# Patient Record
Sex: Female | Born: 1965
Health system: Southern US, Community
[De-identification: ages and names within clinical notes are randomized; demographics above are authoritative.]

## PROBLEM LIST (undated history)

## (undated) DIAGNOSIS — I493 Ventricular premature depolarization: Secondary | ICD-10-CM

## (undated) DIAGNOSIS — E079 Disorder of thyroid, unspecified: Secondary | ICD-10-CM

## (undated) DIAGNOSIS — R011 Cardiac murmur, unspecified: Secondary | ICD-10-CM

## (undated) DIAGNOSIS — E785 Hyperlipidemia, unspecified: Secondary | ICD-10-CM

## (undated) DIAGNOSIS — F32A Depression, unspecified: Secondary | ICD-10-CM

## (undated) DIAGNOSIS — N189 Chronic kidney disease, unspecified: Secondary | ICD-10-CM

## (undated) DIAGNOSIS — K760 Fatty (change of) liver, not elsewhere classified: Secondary | ICD-10-CM

## (undated) DIAGNOSIS — IMO0001 Reserved for inherently not codable concepts without codable children: Secondary | ICD-10-CM

## (undated) DIAGNOSIS — K219 Gastro-esophageal reflux disease without esophagitis: Secondary | ICD-10-CM

## (undated) DIAGNOSIS — C44611 Basal cell carcinoma of skin of unspecified upper limb, including shoulder: Secondary | ICD-10-CM

## (undated) DIAGNOSIS — N1832 Chronic kidney disease, stage 3b: Secondary | ICD-10-CM

## (undated) DIAGNOSIS — J45909 Unspecified asthma, uncomplicated: Secondary | ICD-10-CM

## (undated) DIAGNOSIS — E039 Hypothyroidism, unspecified: Secondary | ICD-10-CM

## (undated) DIAGNOSIS — J449 Chronic obstructive pulmonary disease, unspecified: Secondary | ICD-10-CM

## (undated) DIAGNOSIS — R1013 Epigastric pain: Secondary | ICD-10-CM

## (undated) DIAGNOSIS — R112 Nausea with vomiting, unspecified: Secondary | ICD-10-CM

## (undated) DIAGNOSIS — I499 Cardiac arrhythmia, unspecified: Secondary | ICD-10-CM

## (undated) DIAGNOSIS — F649 Gender identity disorder, unspecified: Secondary | ICD-10-CM

## (undated) DIAGNOSIS — C801 Malignant (primary) neoplasm, unspecified: Secondary | ICD-10-CM

## (undated) DIAGNOSIS — F419 Anxiety disorder, unspecified: Secondary | ICD-10-CM

## (undated) DIAGNOSIS — M1990 Unspecified osteoarthritis, unspecified site: Secondary | ICD-10-CM

## (undated) DIAGNOSIS — K759 Inflammatory liver disease, unspecified: Secondary | ICD-10-CM

## (undated) DIAGNOSIS — G5792 Unspecified mononeuropathy of left lower limb: Secondary | ICD-10-CM

## (undated) DIAGNOSIS — E559 Vitamin D deficiency, unspecified: Secondary | ICD-10-CM

## (undated) DIAGNOSIS — Z9889 Other specified postprocedural states: Secondary | ICD-10-CM

## (undated) DIAGNOSIS — N809 Endometriosis, unspecified: Secondary | ICD-10-CM

## (undated) DIAGNOSIS — I10 Essential (primary) hypertension: Secondary | ICD-10-CM

## (undated) HISTORY — PX: BREAST SURGERY: SHX581

## (undated) HISTORY — DX: Malignant (primary) neoplasm, unspecified: C80.1

## (undated) HISTORY — PX: SINUSOTOMY: SHX291

## (undated) HISTORY — PX: UPPER GI ENDOSCOPY: SHX6162

## (undated) HISTORY — PX: LAPAROSCOPY: SHX197

## (undated) HISTORY — DX: Disorder of thyroid, unspecified: E07.9

## (undated) HISTORY — DX: Gastro-esophageal reflux disease without esophagitis: K21.9

## (undated) HISTORY — DX: Endometriosis, unspecified: N80.9

## (undated) HISTORY — DX: Reserved for inherently not codable concepts without codable children: IMO0001

## (undated) HISTORY — PX: TONSILLECTOMY AND ADENOIDECTOMY: SHX28

## (undated) HISTORY — PX: ABDOMINAL HYSTERECTOMY: SHX81

## (undated) HISTORY — PX: KNEE ARTHROSCOPY: SHX127

## (undated) HISTORY — DX: Ventricular premature depolarization: I49.3

## (undated) HISTORY — DX: Vitamin D deficiency, unspecified: E55.9

---

## 1998-01-18 HISTORY — PX: LAPAROSCOPY: SHX197

## 1998-01-18 HISTORY — PX: COLONOSCOPY: SHX174

## 2001-01-18 HISTORY — PX: UPPER GI ENDOSCOPY: SHX6162

## 2002-01-18 HISTORY — PX: KNEE ARTHROSCOPY: SHX127

## 2004-01-19 HISTORY — PX: TOTAL ABDOMINAL HYSTERECTOMY W/ BILATERAL SALPINGOOPHORECTOMY: SHX83

## 2004-12-22 ENCOUNTER — Inpatient Hospital Stay: Payer: Self-pay | Admitting: Unknown Physician Specialty

## 2006-01-18 DIAGNOSIS — I493 Ventricular premature depolarization: Secondary | ICD-10-CM

## 2006-01-18 HISTORY — DX: Ventricular premature depolarization: I49.3

## 2006-09-24 ENCOUNTER — Emergency Department: Payer: Self-pay | Admitting: Emergency Medicine

## 2006-09-24 ENCOUNTER — Other Ambulatory Visit: Payer: Self-pay

## 2006-10-19 ENCOUNTER — Ambulatory Visit: Payer: Self-pay | Admitting: Internal Medicine

## 2006-10-25 ENCOUNTER — Ambulatory Visit: Payer: Self-pay

## 2006-10-25 ENCOUNTER — Encounter: Payer: Self-pay | Admitting: Internal Medicine

## 2007-01-19 HISTORY — PX: TONSILLECTOMY: SHX5217

## 2007-01-19 HISTORY — PX: NASAL SEPTUM SURGERY: SHX37

## 2010-01-05 ENCOUNTER — Ambulatory Visit: Payer: Self-pay | Admitting: Surgery

## 2010-01-18 HISTORY — PX: BREAST BIOPSY: SHX20

## 2010-06-02 NOTE — Letter (Signed)
October 19, 2006    Wonda Cheng, MD  [address]  [place], [place] [address]   RE:  Destiny Wright, Destiny Wright  MRN:  [id number]  /  DOB:  [birth year]   Dear Francis Dowse,   I hope this letter finds you and your wife well as the school year  starts up again.   It was a pleasure to see Story Conti at your request.  As you know, she  is a 45 year old woman who has a past medical history of endometriosis,  and has a somewhat remote history of having occasional palpitations,  which have become increasingly problematic over the last couple of  months and prompted a visit to the emergency room.  At that time a  diagnosis of chest pain was noted.  Blood work demonstrated a mildly  elevated TSH with a normal T4.  Because of her palpitations, though, she  followed up with you and you undertook a Holter monitor which captured  some of her typical symptoms.  She describes this as a fluttering with  which her heart is beating somewhat fast as well as a hard heart beat  which she sometimes feels in her throat.  These are occasionally  accompanied by shortness of breath.  There is mild light headedness.  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   She has no exertional limitations for the most part and no changes in  her functional capacity.   Her cardiac risk factors are notable for dyslipidemia but are otherwise  negative for family history, cigarettes, hypertension or diabetes.   I should also note that her potassium was low in the emergency room but  currently on followup with you it was normal.   PAST MEDICAL HISTORY:  Her past medical history is as noted previously.   PAST SURGICAL HISTORY:  Her past surgical history is notable for a  laparoscopic surgery, endoscopy, knee arthroscopy and hysterectomy.   SOCIAL HISTORY:  She  lives alone.  She has no children.  She does not  use recreational drugs.  She drinks alcohol occasionally.  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   MEDICATIONS:  Her medications include:  1. Estradiol 1 mg.  2. Prilosec 20.   ALLERGIES:  She has no known drug allergies.   REVIEW OF SYSTEMS:  Her review of systems, in addition to the above, is  notable for:  1. Gastroesophageal reflux.  2. Fatigue.   PHYSICAL EXAMINATION:  GENERAL APPEARANCE: On exam she is a middle-aged  Caucasian female appearing her stated age of 38.  VITAL SIGNS:  Her weight was 215 pounds.  Her blood pressure was 110/72.  Her pulse was 67 and her height was 5 feet 3 inches.  HEENT:  Exam demonstrated no icterus and no xanthomata.  NECK:  The neck veins were flat.  The carotids were brisk and full  bilaterally without bruits.  No thyromegaly was appreciated.  BACK:  The back was without kyphosis, scoliosis.  LUNGS:  Clear.  HEART:  Sounds were regular without murmurs or gallops.  ABDOMEN:  Soft, with active bowel sounds without midline pulsations or  hepatomegaly.  EXTREMITIES:  Femoral pulses were 2+; distal pulses were intact.  There  is no cyanosis, clubbing or edema.  NEUROLOGICAL:  Exam was  grossly normal.  SKIN:  Warm and dry.   An electrocardiogram dated today demonstrated sinus rhythm at 67 with  intervals at 0.16/0.08/0.39.  The axis was 90 degrees.  The  electrocardiogram was otherwise normal.   Review of the Holter monitor that you kindly forwarded demonstrated  monomorphic PVC's comprising 0.9% of her total beats and interestingly,  the bulk of these occur in the period of time after she gets home in the  evenings so that 95% of them occur between the hours of 8:00 and  midnight.   IMPRESSION:  1. Ventricular ectopy - monomorphic.  2. Obesity.  3. Borderline elevated TSH.  4. Borderline low potassium.   Francis Dowse, Ms. Jaskulski has ventricular ectopy which is unusual in its'   temporal distribution.  It is not infrequent that ectopy is more  prominent, more noticeable in the evening and at bedtime but this has  typically been associated with position but her PVC counts are clearly  much higher in the evening.  I wonder whether there is not some  stimulant that was part of her diet, but I couldn't find one on detailed  questioning.   In any case, the implications of PVC's related to the presence or  absence of structural heart disease and accompanying severity of  symptoms.  As it relates to the former, I suggested that we get an  echocardiogram and if this is normal then prognostically I think we can  push on.  In the event that her symptoms are limiting, either beta  blocker's or calcium blocker's would be appropriate to try in this  situation.   I have also explained to her at some length the issue of thyroid  feedback loops so that she would understand the role of the TSH.  She is  to follow up with you about this as well as her hypokalemia.   If there is anything further we can do to help, please don't hesitate to  contact me.    Sincerely,      Duke Salvia, MD, [facility]  Electronically Signed    SCK/MedQ  DD: 10/19/2006  DT: 10/20/2006  Job #: [id number]

## 2010-06-15 ENCOUNTER — Ambulatory Visit: Payer: Self-pay | Admitting: Surgery

## 2010-06-24 ENCOUNTER — Ambulatory Visit: Payer: Self-pay | Admitting: Surgery

## 2010-07-13 ENCOUNTER — Ambulatory Visit: Payer: Self-pay | Admitting: Surgery

## 2010-07-14 LAB — PATHOLOGY REPORT

## 2010-12-28 ENCOUNTER — Ambulatory Visit: Payer: Self-pay

## 2011-01-01 ENCOUNTER — Ambulatory Visit: Payer: Self-pay

## 2011-09-30 ENCOUNTER — Ambulatory Visit: Payer: Self-pay | Admitting: Surgery

## 2012-01-19 HISTORY — PX: ANTERIOR CERVICAL DECOMP/DISCECTOMY FUSION: SHX1161

## 2012-02-24 ENCOUNTER — Ambulatory Visit: Payer: Self-pay | Admitting: Orthopedic Surgery

## 2012-10-20 ENCOUNTER — Ambulatory Visit: Payer: Self-pay | Admitting: Family Medicine

## 2012-12-29 ENCOUNTER — Ambulatory Visit: Payer: Self-pay | Admitting: Unknown Physician Specialty

## 2013-01-18 HISTORY — PX: HAMMER TOE SURGERY: SHX385

## 2013-08-31 ENCOUNTER — Encounter: Payer: Self-pay | Admitting: Podiatry

## 2013-08-31 ENCOUNTER — Ambulatory Visit (INDEPENDENT_AMBULATORY_CARE_PROVIDER_SITE_OTHER): Payer: No Typology Code available for payment source

## 2013-08-31 ENCOUNTER — Ambulatory Visit (INDEPENDENT_AMBULATORY_CARE_PROVIDER_SITE_OTHER): Payer: No Typology Code available for payment source | Admitting: Podiatry

## 2013-08-31 ENCOUNTER — Other Ambulatory Visit: Payer: Self-pay | Admitting: *Deleted

## 2013-08-31 VITALS — BP 149/86 | HR 82 | Resp 16 | Ht 63.0 in | Wt 214.0 lb

## 2013-08-31 DIAGNOSIS — N809 Endometriosis, unspecified: Secondary | ICD-10-CM | POA: Insufficient documentation

## 2013-08-31 DIAGNOSIS — M722 Plantar fascial fibromatosis: Secondary | ICD-10-CM

## 2013-08-31 DIAGNOSIS — M779 Enthesopathy, unspecified: Secondary | ICD-10-CM

## 2013-08-31 MED ORDER — TRIAMCINOLONE ACETONIDE 10 MG/ML IJ SUSP
10.0000 mg | Freq: Once | INTRAMUSCULAR | Status: AC
  Administered 2013-08-31: 10 mg

## 2013-08-31 NOTE — Progress Notes (Signed)
Subjective:     Patient ID: Destiny Wright, female   DOB: 1965-09-10, 48 y.o.   MRN: [id number]  Foot Pain   patient states that I'm having a lot of pain in my forefoot left and also into my arch left. Also states that her right heel is starting to hurt. She is seen several other physicians and has had orthotics made several injections without relief of symptoms and at this time is desperate for relief of pain   Review of Systems  All other systems reviewed and are negative.      Objective:   Physical Exam  Nursing note and vitals reviewed. Constitutional: She is oriented to person, place, and time.  Cardiovascular: Intact distal pulses.   Musculoskeletal: Normal range of motion.  Neurological: She is oriented to person, place, and time.  Skin: Skin is warm.   neurovascular status intact with muscle strength adequate and range of motion of the subtalar and midtarsal joint within normal limits. Patient's found to have equinus bilateral and is noted to have moderate discomfort in the left arch and plantar heel when palpated. Patient is also noted to have extreme inflammation and pain third metatarsal phalangeal joint left with no indications of discomfort in the interspace itself. Patient has well-perfused digits and mild reduction of arch upon weightbearing     Assessment:     Capsulitis of the third metatarsophalangeal joint left with plantar fasciitis right and moderate arch pain left which may be compensation in nature    Plan:     H&P and x-rays reviewed. For the left I did do a proximal nerve block aspirated the joint was able to get out a small amount of clear fluid and then injected with a half cc of dexamethasone Kenalog. For the right I injected the plantar heel 3 mg Kenalog 5 mg Xylocaine and I did dispense a night splint to use on the left foot and also some on the right foot. Placed on [place] and reappoint in 1 week

## 2013-08-31 NOTE — Progress Notes (Signed)
   Subjective:    Patient ID: Destiny Wright, female    DOB: [birth year], 48 y.o.   MRN: [id number]  HPI Comments: The left foot started to hurt last year, went to two other doctors for this foot. The first doctor got me a pair of orthotics , than the second doctor got me custom inserts, new shoes , nsaids, injections . The pain is so bad it is keeping me up at night, pain in my two toes that feel like they are going to crack in two, the arch is a stabbing pain. This is my third doctor i am seeing for my foot.  The orthotics that dr Elvina Mattes has made for me he has been tweaking it since i got them Would like to have the right foot looked at as well, have a little heel pain .  Foot Pain      Review of Systems  Musculoskeletal: Positive for back pain.       Joint pain        Objective:   Physical Exam        Assessment & Plan:

## 2013-09-07 ENCOUNTER — Encounter: Payer: Self-pay | Admitting: Podiatry

## 2013-09-07 ENCOUNTER — Ambulatory Visit (INDEPENDENT_AMBULATORY_CARE_PROVIDER_SITE_OTHER): Payer: No Typology Code available for payment source | Admitting: Podiatry

## 2013-09-07 ENCOUNTER — Ambulatory Visit: Payer: Self-pay | Admitting: Podiatry

## 2013-09-07 VITALS — BP 122/71 | HR 67 | Resp 16

## 2013-09-07 DIAGNOSIS — M722 Plantar fascial fibromatosis: Secondary | ICD-10-CM

## 2013-09-07 DIAGNOSIS — M779 Enthesopathy, unspecified: Secondary | ICD-10-CM

## 2013-09-07 MED ORDER — TRIAMCINOLONE ACETONIDE 10 MG/ML IJ SUSP
10.0000 mg | Freq: Once | INTRAMUSCULAR | Status: AC
Start: 2013-09-07 — End: 2013-09-07
  Administered 2013-09-07: 10 mg

## 2013-09-07 NOTE — Progress Notes (Signed)
Subjective:     Patient ID: Destiny Wright, female   DOB: 05/14/1965, 48 y.o.   MRN: [id number]  HPI patient states that the right foot is somewhat improved on the left foot continues to give discomfort and also it's hurting in the ankle   Review of Systems     Objective:   Physical Exam Neurovascular status was intact no health history changes noted with discomfort in the second metatarsophalangeal joint now versus the third and discomfort in the sinus tarsi left and mild discomfort medial side of the ankle. Right heel is still mildly tender but improved but she's not been wearing the boot on the right instep on the left    Assessment:     Forefoot capsulitis left which appears to be more in the second metatarsophalangeal joint and also probable sinus tarsitis left secondary to compensation and continue plan her fasciitis right    Plan:     Advised on changes and physical therapy regimen and usage of boot. Her forefoot block left aspirated the second MPJ and injected with half cc of dexamethasone Kenalog. The left sinus tarsi injected with 3 mg Kenalog 5 mg Xylocaine and I scanned for customized orthotics that are soft to reduce stress against her feet

## 2013-09-20 ENCOUNTER — Encounter: Payer: Self-pay | Admitting: *Deleted

## 2013-09-20 NOTE — Progress Notes (Signed)
Left message letting pt know orthotics are here and sent post card.

## 2013-10-02 ENCOUNTER — Ambulatory Visit (INDEPENDENT_AMBULATORY_CARE_PROVIDER_SITE_OTHER): Payer: No Typology Code available for payment source | Admitting: Podiatry

## 2013-10-02 VITALS — BP 149/86 | HR 82 | Resp 16

## 2013-10-02 DIAGNOSIS — M722 Plantar fascial fibromatosis: Secondary | ICD-10-CM

## 2013-10-02 DIAGNOSIS — M779 Enthesopathy, unspecified: Secondary | ICD-10-CM

## 2013-10-02 MED ORDER — DICLOFENAC SODIUM 75 MG PO TBEC: 75.0000 mg | DELAYED_RELEASE_TABLET | Freq: Two times a day (BID) | ORAL | Status: DC

## 2013-10-02 NOTE — Progress Notes (Signed)
Subjective:     Patient ID: Destiny Wright, female   DOB: [birth year], 48 y.o.   MRN: [id number]  HPI patient states that my feet are still bothering me some but I have been on them quite a bit   Review of Systems     Objective:   Physical Exam Neurovascular status intact muscle strength adequate with discomfort still in the forefoot left over right with mild swelling of the second and third toes occur    Assessment:     Inflammatory capsulitis that may have been activated by [place] the beach and [place]    Plan:     Instructed on physical therapy supportive shoe gear and dispensed orthotics with instructions on usage. May have to consider further injections or other treatment and continue diclofenac at this time

## 2013-10-16 ENCOUNTER — Telehealth: Payer: Self-pay | Admitting: *Deleted

## 2013-10-16 NOTE — Telephone Encounter (Signed)
Patient stated that she could not take the diclofenac and stopped it

## 2013-11-02 ENCOUNTER — Ambulatory Visit (INDEPENDENT_AMBULATORY_CARE_PROVIDER_SITE_OTHER): Payer: No Typology Code available for payment source | Admitting: Podiatry

## 2013-11-02 ENCOUNTER — Encounter: Payer: Self-pay | Admitting: Podiatry

## 2013-11-02 ENCOUNTER — Ambulatory Visit: Payer: Self-pay

## 2013-11-02 DIAGNOSIS — M2042 Other hammer toe(s) (acquired), left foot: Secondary | ICD-10-CM

## 2013-11-02 DIAGNOSIS — M779 Enthesopathy, unspecified: Secondary | ICD-10-CM

## 2013-11-02 DIAGNOSIS — M216X9 Other acquired deformities of unspecified foot: Secondary | ICD-10-CM

## 2013-11-02 DIAGNOSIS — Q667 Congenital pes cavus: Secondary | ICD-10-CM

## 2013-11-02 NOTE — Progress Notes (Signed)
Subjective:     Patient ID: Destiny Wright, female   DOB: 1966-01-02, 48 y.o.   MRN: [id number]  HPI patient presents stating she's getting a lot of pain in her left foot and that simply has not responded to conservative care. I'm the third physician to see this and this is been present for over a year and has been chronic in its nature with the second and third metatarsophalangeal joints been very painful when palpated   Review of Systems     Objective:   Physical Exam  Neurovascular status intact with muscle strength adequate and range of motion subtalar and midtarsal joint within normal limits. Patient has exquisite discomfort second and third metatarsophalangeal joint with lifting of the toes and pain when palpated into the joint surfaces themselves    Assessment:     Chronic capsulitis second and third metatarsophalangeal joint with failure to respond to numerous conservative care attempts    Plan:     Reviewed condition and spent a great deal of time discussing this. At this time I do think we will be best corrected and I discussed shortening osteotomy second and third metatarsals left with screw fixation and digital fusion either internal or external second and third toes left. Patient wants procedure and I discussed and allow her to read a consent form going over all possible complications as outlined on the form. Patient understands that total recovery brittle be 6 months to one year and we cannot give any long-term guarantees as far as relief of symptoms and signs consent form after review. Patient also is dispensed air fracture walker for the postoperative and we'll start wearing it now and will have this done at her convenience

## 2013-11-12 ENCOUNTER — Encounter: Payer: Self-pay | Admitting: Podiatry

## 2013-11-12 NOTE — Progress Notes (Unsigned)
Prior authorization for surgery ok Auth # [id number] Cpt codes 95320/ 23343

## 2013-11-13 ENCOUNTER — Encounter: Payer: Self-pay | Admitting: Podiatry

## 2013-11-13 DIAGNOSIS — M21542 Acquired clubfoot, left foot: Secondary | ICD-10-CM

## 2013-11-13 DIAGNOSIS — M2022 Hallux rigidus, left foot: Secondary | ICD-10-CM

## 2013-11-16 NOTE — Progress Notes (Signed)
1. Shortening osteotomy with screw fixation 2,3 met left foot  2. Fusion with int or ext k wire 2,3 left foot

## 2013-11-23 ENCOUNTER — Encounter: Payer: Self-pay | Admitting: Podiatry

## 2013-11-23 ENCOUNTER — Ambulatory Visit (INDEPENDENT_AMBULATORY_CARE_PROVIDER_SITE_OTHER): Payer: No Typology Code available for payment source

## 2013-11-23 ENCOUNTER — Ambulatory Visit (INDEPENDENT_AMBULATORY_CARE_PROVIDER_SITE_OTHER): Payer: No Typology Code available for payment source | Admitting: Podiatry

## 2013-11-23 VITALS — BP 158/94 | HR 108 | Temp 96.7°F | Resp 16

## 2013-11-23 DIAGNOSIS — M2042 Other hammer toe(s) (acquired), left foot: Secondary | ICD-10-CM

## 2013-11-23 DIAGNOSIS — M779 Enthesopathy, unspecified: Secondary | ICD-10-CM

## 2013-11-23 NOTE — Progress Notes (Signed)
Subjective:     Patient ID: Destiny Wright, female   DOB: 11-Mar-1965, 48 y.o.   MRN: [id number]  HPIpatient states that her toes feel swollen but overall she seems to be doing okay and she is able to walk on her foot with minimal discomfort. 10 days after extensive foot surgery left   Review of Systems     Objective:   Physical Exam Neurovascular status intact muscle strength adequate and I found negative Homans sign noted. Patient's left second and third metatarsal are doing well with the wound edges well coapted and the digits and good alignment patient is able to ambulate without difficulty currently    Assessment:     Doing well post forefoot reconstruction left    Plan:     Reviewed x-rays and discussed condition at this time reapplied sterile dressing and gave instructions on elevation ibuprofen and compression. Reappoint in 2 weeks earlier if any issues should occur

## 2013-12-07 ENCOUNTER — Ambulatory Visit (INDEPENDENT_AMBULATORY_CARE_PROVIDER_SITE_OTHER): Payer: No Typology Code available for payment source

## 2013-12-07 ENCOUNTER — Ambulatory Visit (INDEPENDENT_AMBULATORY_CARE_PROVIDER_SITE_OTHER): Payer: No Typology Code available for payment source | Admitting: Podiatry

## 2013-12-07 VITALS — BP 131/88 | HR 74 | Resp 16

## 2013-12-07 DIAGNOSIS — Q667 Congenital pes cavus: Secondary | ICD-10-CM

## 2013-12-07 DIAGNOSIS — M2042 Other hammer toe(s) (acquired), left foot: Secondary | ICD-10-CM

## 2013-12-07 DIAGNOSIS — M216X9 Other acquired deformities of unspecified foot: Secondary | ICD-10-CM

## 2013-12-07 DIAGNOSIS — R609 Edema, unspecified: Secondary | ICD-10-CM

## 2013-12-08 NOTE — Progress Notes (Signed)
Subjective:     Patient ID: Destiny Wright, female   DOB: 1965-11-17, 48 y.o.   MRN: [id number]  HPI patient states it's doing well but I am getting swelling if him on my foot to much   Review of Systems     Objective:   Physical Exam Neurovascular status intact negative Homans sign noted with well-healing surgical site second third metatarsal second and third toes with moderate forefoot edema noted and obesity is a complicating factor with the edema    Assessment:     Patient's healing well but does have moderate excessive edema at this time    Plan:     H&P and x-rays reviewed and today I applied Unna boot with Ace wrap in order to compress the foot and advised on elevation and then gradual return to saw shoe gear within the next couple weeks. Reappoint in 3 weeks to recheck or earlier if any issues should occur

## 2013-12-28 ENCOUNTER — Ambulatory Visit: Payer: Self-pay

## 2013-12-28 ENCOUNTER — Encounter: Payer: Self-pay | Admitting: Podiatry

## 2013-12-28 ENCOUNTER — Ambulatory Visit (INDEPENDENT_AMBULATORY_CARE_PROVIDER_SITE_OTHER): Payer: No Typology Code available for payment source | Admitting: Podiatry

## 2013-12-28 ENCOUNTER — Ambulatory Visit (INDEPENDENT_AMBULATORY_CARE_PROVIDER_SITE_OTHER): Payer: No Typology Code available for payment source

## 2013-12-28 DIAGNOSIS — Q667 Congenital pes cavus: Secondary | ICD-10-CM

## 2013-12-28 DIAGNOSIS — M2042 Other hammer toe(s) (acquired), left foot: Secondary | ICD-10-CM

## 2013-12-28 DIAGNOSIS — M779 Enthesopathy, unspecified: Secondary | ICD-10-CM

## 2013-12-28 DIAGNOSIS — M216X9 Other acquired deformities of unspecified foot: Secondary | ICD-10-CM

## 2013-12-29 NOTE — Progress Notes (Signed)
Subjective:     Patient ID: Destiny Wright, female   DOB: 09-01-1965, 48 y.o.   MRN: [id number]  HPI patient states I'm having some shooting pain but I seem to be improving and gradually my toes are getting better and the pain is reduced   Review of Systems     Objective:   Physical Exam Neurovascular status is intact negative Homans sign noted with well-healing surgical site second and third digits left and second and third metatarsal with wound edges coapted well mild edema still present but no pain in the metatarsal phalangeal joints    Assessment:     Improving from surgery of an extensive nature forefoot left    Plan:     Reviewed continued elevation compression and try to start wearing soft shoe gear and the fact that recovery is occurring but it is slow rate for her but I am quite confident long-term based on x-ray evaluation. Patient will continue with the above and will be seen back in the next several weeks earlier if any issues should occur

## 2014-01-03 ENCOUNTER — Telehealth: Payer: Self-pay

## 2014-01-03 NOTE — Telephone Encounter (Signed)
Pt called regarding a question about wearing orthotics and wants to know if she can have a RX for tramadol

## 2014-01-04 ENCOUNTER — Other Ambulatory Visit: Payer: Self-pay | Admitting: *Deleted

## 2014-01-04 MED ORDER — TRAMADOL HCL 50 MG PO TABS: 50.0000 mg | ORAL_TABLET | Freq: Three times a day (TID) | ORAL | Status: DC | PRN

## 2014-01-04 NOTE — Telephone Encounter (Signed)
If no allergies, that is fine. They will have to come into the office to pick it up. Thanks.

## 2014-01-04 NOTE — Telephone Encounter (Signed)
Spoke to patient about wearing orthotics. Patient said that Dr. Paulla Dolly offered her a Rx for Tramadol and at the time, but has changed her mind. She would like a Rx for the Tramadol. Will check with the doc on call and call her back.

## 2014-01-04 NOTE — Telephone Encounter (Signed)
Called patient-let her know that I faxed her Tramadol Rx to [facility] in [place].

## 2014-01-15 ENCOUNTER — Ambulatory Visit (INDEPENDENT_AMBULATORY_CARE_PROVIDER_SITE_OTHER): Payer: No Typology Code available for payment source | Admitting: Podiatry

## 2014-01-15 ENCOUNTER — Ambulatory Visit: Payer: Self-pay

## 2014-01-15 DIAGNOSIS — M2042 Other hammer toe(s) (acquired), left foot: Secondary | ICD-10-CM

## 2014-01-15 DIAGNOSIS — R609 Edema, unspecified: Secondary | ICD-10-CM

## 2014-01-16 ENCOUNTER — Ambulatory Visit: Payer: Self-pay | Admitting: Obstetrics and Gynecology

## 2014-01-16 NOTE — Progress Notes (Signed)
Subjective:     Patient ID: Destiny Wright, female   DOB: 1965/03/02, 48 y.o.   MRN: [id number]  HPI patient is concerned because she still is developing swelling in her forefoot left and pain in her big toe that she wanted to get checked as that seems to give her trouble   Review of Systems     Objective:   Physical Exam Neurovascular status is intact no change in health history with well-healed surgical sites second and third metatarsal left and second and third digits left with moderate edema noted and no indications of pathology. Left big toe does have good range of motion with no redness or other areas that indicate inflammatory process    Assessment:     Continued persistent edema secondary to moderate obesity extensive foot surgery and possible mild vein disease. Left big toe may be irritated secondary to swelling    Plan:     Reviewed condition and the importance of continued elevation immobilization and compression. I did dispense a fascial compression stocking today which she is tolerating well and advised on return to soft shoe gear and increased activity at the swelling will work out but unfortunately will take time. We are [place] hopefully return her to work middle of January after reevaluate her and I reviewed the x-rays with her today indicating good healing that is occurring

## 2014-02-01 ENCOUNTER — Ambulatory Visit (INDEPENDENT_AMBULATORY_CARE_PROVIDER_SITE_OTHER): Payer: No Typology Code available for payment source

## 2014-02-01 ENCOUNTER — Ambulatory Visit (INDEPENDENT_AMBULATORY_CARE_PROVIDER_SITE_OTHER): Payer: No Typology Code available for payment source | Admitting: Podiatry

## 2014-02-01 DIAGNOSIS — M2042 Other hammer toe(s) (acquired), left foot: Secondary | ICD-10-CM

## 2014-02-01 DIAGNOSIS — R609 Edema, unspecified: Secondary | ICD-10-CM

## 2014-02-01 MED ORDER — TRAMADOL HCL 50 MG PO TABS: 50.0000 mg | ORAL_TABLET | Freq: Three times a day (TID) | ORAL | Status: DC

## 2014-02-01 NOTE — Progress Notes (Signed)
Subjective:     Patient ID: Destiny Wright, female   DOB: [birth year], 49 y.o.   MRN: [id number]  HPI patient states I'm starting to do quite a bit better but I do not believe that I am ready for 12 hours standing days at this time   Review of Systems     Objective:   Physical Exam Neurovascular status remains normal and intact and everything looks good as far as structure goes with good alignment of the second and third toes wound edges coapted well and diminished discomfort upon palpation and diminished swelling    Assessment:     Gradual improvement from extensive forefoot surgery left    Plan:     Continue tramadol and she has run out so I rewrote her prescription and advised on increased activity with hopeful return to work in 3 weeks when I reevaluate. Reevaluated x-rays with her today

## 2014-02-22 ENCOUNTER — Encounter: Payer: Self-pay | Admitting: Podiatry

## 2014-02-22 ENCOUNTER — Ambulatory Visit (INDEPENDENT_AMBULATORY_CARE_PROVIDER_SITE_OTHER): Payer: No Typology Code available for payment source

## 2014-02-22 ENCOUNTER — Ambulatory Visit (INDEPENDENT_AMBULATORY_CARE_PROVIDER_SITE_OTHER): Payer: No Typology Code available for payment source | Admitting: Podiatry

## 2014-02-22 VITALS — BP 130/77 | HR 68 | Resp 16

## 2014-02-22 DIAGNOSIS — M2042 Other hammer toe(s) (acquired), left foot: Secondary | ICD-10-CM

## 2014-02-22 DIAGNOSIS — R609 Edema, unspecified: Secondary | ICD-10-CM

## 2014-02-24 NOTE — Progress Notes (Signed)
Subjective:     Patient ID: Destiny Wright, female   DOB: 05/27/1965, 49 y.o.   MRN: [id number]  HPI patient states my foot is starting to do quite a bit better and I'm able to walk with just mild discomfort and swelling   Review of Systems     Objective:   Physical Exam Neurovascular status intact with digits and good alignment and edema continuing to reduce with significant reduction of discomfort in the second third metatarsal phalangeal joint left    Assessment:     Improvement of pain after extensive forefoot reconstruction left with reduction of edema noted    Plan:     Reviewed x-rays and at this time allow patient to gradually increase neck Timothy disease and return to work area I would prefer she works 8 hours a day but she may need to work 12 hour shifts and we'll we will see how she holds up for this

## 2014-03-18 ENCOUNTER — Ambulatory Visit
Admit: 2014-03-18 | Disposition: A | Discharge status: Home / Self Care | Payer: Self-pay | Attending: Nurse Practitioner | Admitting: Nurse Practitioner

## 2014-03-27 ENCOUNTER — Ambulatory Visit (INDEPENDENT_AMBULATORY_CARE_PROVIDER_SITE_OTHER): Payer: No Typology Code available for payment source | Admitting: Podiatry

## 2014-03-27 ENCOUNTER — Encounter: Payer: Self-pay | Admitting: Podiatry

## 2014-03-27 ENCOUNTER — Ambulatory Visit: Payer: No Typology Code available for payment source

## 2014-03-27 VITALS — BP 151/90 | HR 63 | Resp 16

## 2014-03-27 DIAGNOSIS — M79671 Pain in right foot: Secondary | ICD-10-CM

## 2014-03-27 DIAGNOSIS — G629 Polyneuropathy, unspecified: Secondary | ICD-10-CM | POA: Diagnosis not present

## 2014-03-27 DIAGNOSIS — M79672 Pain in left foot: Secondary | ICD-10-CM

## 2014-03-27 DIAGNOSIS — M722 Plantar fascial fibromatosis: Secondary | ICD-10-CM

## 2014-03-27 MED ORDER — TRIAMCINOLONE ACETONIDE 10 MG/ML IJ SUSP: 10.0000 mg | Freq: Once | INTRAMUSCULAR | Status: AC

## 2014-03-27 MED ORDER — GABAPENTIN 400 MG PO CAPS: 400.0000 mg | ORAL_CAPSULE | Freq: Two times a day (BID) | ORAL | Status: DC

## 2014-03-29 NOTE — Progress Notes (Signed)
Subjective:     Patient ID: Destiny Wright, female   DOB: 1965-10-20, 49 y.o.   MRN: [id number]  HPI patient presents stating that my forefoot is doing pretty well but is still can get tender and swollen at the end of the day in my heel has really started to hurt me   Review of Systems     Objective:   Physical Exam Several months after having extensive forefoot surgery left with mild edema still noted but continues to improve with good alignment of the second and third toes and wound edges well coapted. Quite a bit of discomfort in the medial fascial band left at the insertion of the tendon into the calcaneus    Assessment:     Plantar fasciitis left with inflammatory capsulitis still noted which continues to improve gradually with no indication of mottled skin cold this to the foot but mild edema still noted    Plan:     Reviewed conditions and injected the left plantar fashion 3 motor Kenalog 5 mill grams Xylocaine she may have a very mild chronic pain type syndrome and we'll start her on medicine to try to improve this and I will be watched carefully. At this point though she is working a regular shift and while she does have some excessive edema she is not having significant discomfort currently. We will keep an eye on her

## 2014-04-05 ENCOUNTER — Ambulatory Visit: Payer: No Typology Code available for payment source | Admitting: Podiatry

## 2014-04-19 ENCOUNTER — Encounter: Payer: Self-pay | Admitting: Podiatry

## 2014-04-19 ENCOUNTER — Ambulatory Visit (INDEPENDENT_AMBULATORY_CARE_PROVIDER_SITE_OTHER): Payer: No Typology Code available for payment source | Admitting: Podiatry

## 2014-04-19 ENCOUNTER — Ambulatory Visit
Admit: 2014-04-19 | Disposition: A | Discharge status: Home / Self Care | Payer: Self-pay | Attending: Nurse Practitioner | Admitting: Nurse Practitioner

## 2014-04-19 VITALS — BP 118/73 | HR 69 | Resp 16 | Ht 63.0 in | Wt 224.0 lb

## 2014-04-19 DIAGNOSIS — M79672 Pain in left foot: Secondary | ICD-10-CM

## 2014-04-19 DIAGNOSIS — M779 Enthesopathy, unspecified: Secondary | ICD-10-CM | POA: Diagnosis not present

## 2014-04-19 MED ORDER — TRIAMCINOLONE ACETONIDE 10 MG/ML IJ SUSP
10.0000 mg | Freq: Once | INTRAMUSCULAR | Status: AC
  Administered 2014-04-19: 10 mg

## 2014-05-17 ENCOUNTER — Ambulatory Visit: Payer: No Typology Code available for payment source | Admitting: Podiatry

## 2014-06-20 ENCOUNTER — Encounter: Payer: Self-pay | Admitting: Podiatry

## 2014-06-20 ENCOUNTER — Ambulatory Visit (INDEPENDENT_AMBULATORY_CARE_PROVIDER_SITE_OTHER): Payer: No Typology Code available for payment source | Admitting: Podiatry

## 2014-06-20 VITALS — BP 144/81 | HR 59 | Resp 12

## 2014-06-20 DIAGNOSIS — G629 Polyneuropathy, unspecified: Secondary | ICD-10-CM | POA: Diagnosis not present

## 2014-06-20 DIAGNOSIS — M779 Enthesopathy, unspecified: Secondary | ICD-10-CM | POA: Diagnosis not present

## 2014-06-20 MED ORDER — GABAPENTIN 400 MG PO CAPS: 400.0000 mg | ORAL_CAPSULE | Freq: Three times a day (TID) | ORAL | Status: DC

## 2014-06-20 NOTE — Progress Notes (Signed)
   Subjective:    Patient ID: Destiny Wright, female    DOB: [birth year], 49 y.o.   MRN: [id number]  HPI Patient was diagnosed with Tendinitis On L foot. Has sharp shooting pains all across the top between most of her toes (Possible Neuropathy). Pain can also be on the ball of her foot. Has been treated for Plantar Fascitis in the past. R foot has some occasional pain of similar variety but has subsided more.   Review of Systems  Skin: Positive for color change.       Objective:   Physical Exam        Assessment & Plan:

## 2014-06-21 ENCOUNTER — Telehealth: Payer: Self-pay | Admitting: *Deleted

## 2014-06-21 NOTE — Progress Notes (Signed)
Subjective:     Patient ID: Destiny Wright, female   DOB: [birth year], 49 y.o.   MRN: [id number]  HPI patient states I'm still having some pain on top of my foot and I was concerned that it continues to me trouble when I'm on my foot a lot. The area the surgery was done seems to be doing pretty well   Review of Systems     Objective:   Physical Exam Neurovascular status found to be intact with mild edema and no indications of abnormal swelling occurring the incision site second third metatarsal healed well and the second and third toes and good alignment with minimal discomfort in the second and third metatarsal phalangeal joints. Patient does have discomfort in the dorsum of the foot but it is nondescript in its nature    Assessment:     This still may be some inflammation as she recovers from surgery but it also may be some low grade neuropathic pain    Plan:     We're going to increase her gabapentin dosage to 3 a day and she will continue use heat and ice and I'm writing her for a topical compound to try to reduce the stress against the top of her foot. Also at this time she will use tramadol as needed especially in the evenings and will be seen back in the next several months

## 2014-06-21 NOTE — Telephone Encounter (Signed)
Rx for [facility] compounding entered in to medications.

## 2014-07-05 ENCOUNTER — Ambulatory Visit: Payer: Self-pay | Admitting: Podiatry

## 2014-10-18 NOTE — Progress Notes (Signed)
   Subjective:    Patient ID: Destiny Wright, female    DOB: [birth year], 49 y.o.   MRN: [id number]  HPI    Review of Systems     Objective:   Physical Exam        Assessment & Plan:

## 2015-01-19 HISTORY — PX: SIMPLE MASTECTOMY: SHX6097

## 2015-01-23 ENCOUNTER — Emergency Department
Admission: EM | Admit: 2015-01-23 | Discharge: 2015-01-23 | Disposition: A | Discharge status: Home / Self Care | Payer: Managed Care, Other (non HMO) | Attending: Emergency Medicine | Admitting: Emergency Medicine

## 2015-01-23 ENCOUNTER — Encounter: Payer: Self-pay | Admitting: Emergency Medicine

## 2015-01-23 ENCOUNTER — Emergency Department: Payer: Managed Care, Other (non HMO)

## 2015-01-23 DIAGNOSIS — R1032 Left lower quadrant pain: Secondary | ICD-10-CM | POA: Diagnosis not present

## 2015-01-23 DIAGNOSIS — R112 Nausea with vomiting, unspecified: Secondary | ICD-10-CM | POA: Insufficient documentation

## 2015-01-23 DIAGNOSIS — Z79899 Other long term (current) drug therapy: Secondary | ICD-10-CM | POA: Diagnosis not present

## 2015-01-23 DIAGNOSIS — Z87891 Personal history of nicotine dependence: Secondary | ICD-10-CM | POA: Insufficient documentation

## 2015-01-23 DIAGNOSIS — M545 Low back pain: Secondary | ICD-10-CM | POA: Insufficient documentation

## 2015-01-23 DIAGNOSIS — R1030 Lower abdominal pain, unspecified: Secondary | ICD-10-CM

## 2015-01-23 LAB — URINALYSIS COMPLETE WITH MICROSCOPIC (ARMC ONLY)
Bacteria, UA: NONE SEEN
Bilirubin Urine: NEGATIVE
Glucose, UA: NEGATIVE mg/dL
Hgb urine dipstick: NEGATIVE
Ketones, ur: NEGATIVE mg/dL
Nitrite: NEGATIVE
Protein, ur: NEGATIVE mg/dL
Specific Gravity, Urine: 1.026 (ref 1.005–1.030)
pH: 5 (ref 5.0–8.0)

## 2015-01-23 LAB — LIPASE, BLOOD: Lipase: 36 U/L (ref 11–51)

## 2015-01-23 LAB — COMPREHENSIVE METABOLIC PANEL
ALT: 49 U/L (ref 14–54)
AST: 34 U/L (ref 15–41)
Albumin: 4.1 g/dL (ref 3.5–5.0)
Alkaline Phosphatase: 77 U/L (ref 38–126)
Anion gap: 5 (ref 5–15)
BUN: 20 mg/dL (ref 6–20)
CO2: 29 mmol/L (ref 22–32)
Calcium: 9.5 mg/dL (ref 8.9–10.3)
Chloride: 105 mmol/L (ref 101–111)
Creatinine, Ser: 1.03 mg/dL — ABNORMAL HIGH (ref 0.44–1.00)
GFR calc Af Amer: >60 mL/min (ref >60)
GFR calc non Af Amer: >60 mL/min (ref >60)
Glucose, Bld: 102 mg/dL — ABNORMAL HIGH (ref 65–99)
Potassium: 4 mmol/L (ref 3.5–5.1)
Sodium: 139 mmol/L (ref 135–145)
Total Bilirubin: 0.4 mg/dL (ref 0.3–1.2)
Total Protein: 7.6 g/dL (ref 6.5–8.1)

## 2015-01-23 LAB — CBC
HCT: 35.8 % (ref 35.0–47.0)
Hemoglobin: 12 g/dL (ref 12.0–16.0)
MCH: 30.2 pg (ref 26.0–34.0)
MCHC: 33.4 g/dL (ref 32.0–36.0)
MCV: 90.4 fL (ref 80.0–100.0)
Platelets: 263 10*3/uL (ref 150–440)
RBC: 3.96 MIL/uL (ref 3.80–5.20)
RDW: 13 % (ref 11.5–14.5)
WBC: 6.9 10*3/uL (ref 3.6–11.0)

## 2015-01-23 MED ORDER — IOHEXOL 240 MG/ML SOLN
25.0000 mL | Freq: Once | INTRAMUSCULAR | Status: AC | PRN
  Administered 2015-01-23: 25 mL via ORAL
  Filled 2015-01-23: qty 25

## 2015-01-23 MED ORDER — IOHEXOL 300 MG/ML  SOLN
100.0000 mL | Freq: Once | INTRAMUSCULAR | Status: AC | PRN
  Administered 2015-01-23: 100 mL via INTRAVENOUS
  Filled 2015-01-23: qty 100

## 2015-01-23 MED ORDER — ONDANSETRON 4 MG PO TBDP: 4.0000 mg | ORAL_TABLET | Freq: Three times a day (TID) | ORAL | Status: DC | PRN

## 2015-01-23 NOTE — Discharge Instructions (Signed)
Please take a nausea medication as needed, as written. Please follow-up with your pediatrician in one to 2 days for recheck. Return to the emergency department for any worsening pain, fever, or any other symptom personally concerning to your self.   Abdominal Pain, Adult Many things can cause abdominal pain. Usually, abdominal pain is not caused by a disease and will improve without treatment. It can often be observed and treated at home. Your health care provider will do a physical exam and possibly order blood tests and X-rays to help determine the seriousness of your pain. However, in many cases, more time must pass before a clear cause of the pain can be found. Before that point, your health care provider may not know if you need more testing or further treatment. HOME CARE INSTRUCTIONS Monitor your abdominal pain for any changes. The following actions may help to alleviate any discomfort you are experiencing:  Only take over-the-counter or prescription medicines as directed by your health care provider.  Do not take laxatives unless directed to do so by your health care provider.  Try a clear liquid diet (broth, tea, or water) as directed by your health care provider. Slowly move to a bland diet as tolerated. SEEK MEDICAL CARE IF:  You have unexplained abdominal pain.  You have abdominal pain associated with nausea or diarrhea.  You have pain when you urinate or have a bowel movement.  You experience abdominal pain that wakes you in the night.  You have abdominal pain that is worsened or improved by eating food.  You have abdominal pain that is worsened with eating fatty foods.  You have a fever. SEEK IMMEDIATE MEDICAL CARE IF:  Your pain does not go away within 2 hours.  You keep throwing up (vomiting).  Your pain is felt only in portions of the abdomen, such as the right side or the left lower portion of the abdomen.  You pass bloody or black tarry stools. MAKE SURE  YOU:  Understand these instructions.  Will watch your condition.  Will get help right away if you are not doing well or get worse.   This information is not intended to replace advice given to you by your health care provider. Make sure you discuss any questions you have with your health care provider.   Document Released: 10/14/2004 Document Revised: 09/25/2014 Document Reviewed: 09/13/2012 Elsevier Interactive Patient Education [employer].

## 2015-01-23 NOTE — ED Provider Notes (Addendum)
[facility] Emergency Department Provider Note  Time seen: 3:20 PM  I have reviewed the triage vital signs and the nursing notes.   HISTORY  Chief Complaint Abdominal Pain    HPI Destiny Wright is a 50 y.o. female with a past medical history of endometriosis,diverticulitis, hyperlipidemia hypothyroid presents to the emergency department with lower abdominal pain. According to the patient yesterday she was experiencing some epigastric abdominal pain with mild diffuse abdominal cramping. Today she states the pain is localized to her lower abdomen and especially in her left lower quadrant. States a dull aching pain, moderate in severity currently states a 4/10. Denies diarrhea. She states she has been having some mild constipation recently although had a normal bowel movement yesterday. Denies any black or bloody stool. Denies nausea or vomiting. Denies fever. Patient was concerned that this could be diverticulitis is a feels very similar to a past episode she has experienced.     Past Medical History  Diagnosis Date  . Thyroid disease   . Reflux   . Vitamin D deficiency   . Endometriosis   . PVC (premature ventricular contraction)   . Cancer [facility])     Patient Active Problem List   Diagnosis Date Noted  . Endometriosis 08/31/2013    Past Surgical History  Procedure Laterality Date  . Abdominal hysterectomy    . Laparoscopy    . Upper gi endoscopy    . Knee arthroscopy    . Tonsillectomy and adenoidectomy    . Sinusotomy    . Breast surgery      Current Outpatient Rx  Name  Route  Sig  Dispense  Refill  . atorvastatin (LIPITOR) 10 MG tablet   Oral   Take 10 mg by mouth at bedtime.      0   . atorvastatin (LIPITOR) 20 MG tablet   Oral   Take 20 mg by mouth daily.      0   . calcium-vitamin D (CALCIUM 500/D) 500-200 MG-UNIT per tablet   Oral   Take by mouth.         . cholecalciferol (VITAMIN D) 1000 UNITS tablet   Oral   Take  2,000 Units by mouth daily.         Marland Kitchen estradiol (ESTRACE) 0.5 MG tablet   Oral   Take by mouth.         Marland Kitchen FLUoxetine (PROZAC) 40 MG capsule   Oral   Take 40 mg by mouth daily.      0   . gabapentin (NEURONTIN) 400 MG capsule   Oral   Take 1 capsule (400 mg total) by mouth 2 (two) times daily.   69 capsule   3   . gabapentin (NEURONTIN) 400 MG capsule   Oral   Take 1 capsule (400 mg total) by mouth 3 (three) times daily.   90 capsule   3   . lansoprazole (PREVACID) 15 MG capsule   Oral   Take by mouth.         . levothyroxine (SYNTHROID, LEVOTHROID) 50 MCG tablet   Oral   Take by mouth.         . NONFORMULARY OR COMPOUNDED [place] compound - Musculosketetal pain or inflammation: Baclofen 3%, Clonidine 0.2%, Diclofenac 4%, Gabapentin 6%, Lidocaine 3%.         . sertraline (ZOLOFT) 100 MG tablet   Oral   Take 150 mg by mouth daily.          Marland Kitchen  traMADol (ULTRAM) 50 MG tablet   Oral   Take 1 tablet (50 mg total) by mouth 3 (three) times daily.   90 tablet   2     Allergies Diclofenac and Other  History reviewed. No pertinent family history.  Social History Social History  Substance Use Topics  . Smoking status: Former Research scientist (life sciences)  . Smokeless tobacco: Never Used     Comment: quit June 19 2004  . Alcohol Use: Yes     Comment: rare    Review of Systems Constitutional: Negative for fever. Cardiovascular: Negative for chest pain. Respiratory: Negative for shortness of breath. Gastrointestinal: Positive lower abdominal pain. Negative for nausea, vomiting, diarrhea. Normal bowel movement yesterday. Genitourinary: Negative for dysuria. Musculoskeletal: Lower back pain Neurological: Negative for headache 10-point ROS otherwise negative.  ____________________________________________   PHYSICAL EXAM:  VITAL SIGNS: ED Triage Vitals  Enc Vitals Group     BP 01/23/15 1346 130/62 mmHg     Pulse Rate 01/23/15 1346 57     Resp  01/23/15 1346 18     Temp 01/23/15 1346 97.7 F (36.5 C)     Temp Source 01/23/15 1346 Oral     SpO2 01/23/15 1346 97 %     Weight 01/23/15 1346 213 lb (96.616 kg)     Height 01/23/15 1346 5\' 3"  (1.6 m)     Head Cir --      Peak Flow --      Pain Score 01/23/15 1347 8     Pain Loc --      Pain Edu? --      Excl. in [place]? --     Constitutional: Alert and oriented. Well appearing and in no distress. Eyes: Normal exam ENT   Head: Normocephalic and atraumatic.   Mouth/Throat: Mucous membranes are moist. Cardiovascular: Normal rate, regular rhythm. No murmurs, rubs, or gallops. Respiratory: Normal respiratory effort without tachypnea nor retractions. Breath sounds are clear and equal bilaterally. No wheezes/rales/rhonchi. Gastrointestinal: Soft, moderate lower abdominal tenderness palpation especially in the left lower quadrant. No rebound or guarding. No distention. No epigastric tenderness. Musculoskeletal: Nontender with normal range of motion in all extremities.  Neurologic:  Normal speech and language. No gross focal neurologic deficits  Skin:  Skin is warm, dry and intact.  Psychiatric: Mood and affect are normal. Speech and behavior are normal.  ____________________________________________    EKG  EKG reviewed and interpreted by myself shows sinus bradycardia at 54 bpm, narrow QRS, normal axis, normal intervals, no ST changes. Normal EKG.  ____________________________________________    RADIOLOGY  CT abdomen and pelvis is negative  ____________________________________________   INITIAL IMPRESSION / ASSESSMENT AND PLAN / ED COURSE  Pertinent labs & imaging results that were available during my care of the patient were reviewed by me and considered in my medical decision making (see chart for details).  Patient presents for lower abdominal pain, especially in the left lower quadrant. We will obtain a CT scan to evaluate for possible diverticulitis/colitis.  Patient's labs are largely within normal limits. Patient is status post hysterectomy. Patient is agreeable to plan.  CT abdomen and pelvis is negative. We will discharge patient home with Zofran as needed for nausea, and primary care follow-up in 2 days for recheck. Patient and mother are agreeable.  ____________________________________________   FINAL CLINICAL IMPRESSION(S) / ED DIAGNOSES  Lower abdominal pain   Harvest Dark, MD 01/23/15 [place], MD 01/23/15 1701

## 2015-01-23 NOTE — ED Notes (Signed)
Pt c/o upper abdominal pain and lower abdominal pain. Denies NVD.

## 2015-03-06 ENCOUNTER — Emergency Department
Admission: EM | Admit: 2015-03-06 | Discharge: 2015-03-06 | Disposition: A | Discharge status: Home / Self Care | Payer: Worker's Compensation | Attending: Emergency Medicine | Admitting: Emergency Medicine

## 2015-03-06 DIAGNOSIS — Y990 Civilian activity done for income or pay: Secondary | ICD-10-CM | POA: Diagnosis not present

## 2015-03-06 DIAGNOSIS — W11XXXA Fall on and from ladder, initial encounter: Secondary | ICD-10-CM | POA: Insufficient documentation

## 2015-03-06 DIAGNOSIS — Y9289 Other specified places as the place of occurrence of the external cause: Secondary | ICD-10-CM | POA: Insufficient documentation

## 2015-03-06 DIAGNOSIS — S3992XA Unspecified injury of lower back, initial encounter: Secondary | ICD-10-CM | POA: Diagnosis not present

## 2015-03-06 DIAGNOSIS — M791 Myalgia, unspecified site: Secondary | ICD-10-CM

## 2015-03-06 DIAGNOSIS — Z79899 Other long term (current) drug therapy: Secondary | ICD-10-CM | POA: Insufficient documentation

## 2015-03-06 DIAGNOSIS — S0990XA Unspecified injury of head, initial encounter: Secondary | ICD-10-CM | POA: Diagnosis present

## 2015-03-06 DIAGNOSIS — S199XXA Unspecified injury of neck, initial encounter: Secondary | ICD-10-CM | POA: Diagnosis not present

## 2015-03-06 DIAGNOSIS — E039 Hypothyroidism, unspecified: Secondary | ICD-10-CM | POA: Diagnosis not present

## 2015-03-06 DIAGNOSIS — M2550 Pain in unspecified joint: Secondary | ICD-10-CM

## 2015-03-06 DIAGNOSIS — S0003XA Contusion of scalp, initial encounter: Secondary | ICD-10-CM

## 2015-03-06 DIAGNOSIS — Z87891 Personal history of nicotine dependence: Secondary | ICD-10-CM | POA: Insufficient documentation

## 2015-03-06 DIAGNOSIS — Y9389 Activity, other specified: Secondary | ICD-10-CM | POA: Insufficient documentation

## 2015-03-06 DIAGNOSIS — E785 Hyperlipidemia, unspecified: Secondary | ICD-10-CM | POA: Diagnosis not present

## 2015-03-06 MED ORDER — METHOCARBAMOL 500 MG PO TABS
ORAL_TABLET | ORAL | Status: AC
  Administered 2015-03-06: 1000 mg via ORAL
  Filled 2015-03-06: qty 2

## 2015-03-06 MED ORDER — OXYCODONE-ACETAMINOPHEN 5-325 MG PO TABS: 1.0000 | ORAL_TABLET | ORAL | Status: DC | PRN

## 2015-03-06 MED ORDER — METHOCARBAMOL 500 MG PO TABS
1000.0000 mg | ORAL_TABLET | Freq: Once | ORAL | Status: AC
  Administered 2015-03-06: 1000 mg via ORAL

## 2015-03-06 MED ORDER — TRAMADOL HCL 50 MG PO TABS
ORAL_TABLET | ORAL | Status: AC
  Filled 2015-03-06: qty 1

## 2015-03-06 MED ORDER — METHOCARBAMOL 750 MG PO TABS: 750.0000 mg | ORAL_TABLET | Freq: Four times a day (QID) | ORAL | Status: DC

## 2015-03-06 MED ORDER — TRAMADOL HCL 50 MG PO TABS
50.0000 mg | ORAL_TABLET | Freq: Once | ORAL | Status: AC
  Administered 2015-03-06: 50 mg via ORAL

## 2015-03-06 NOTE — ED Notes (Addendum)
Pt arrives to ER c/o fall from ladder at work. Pt reports ladder was approx 4 steps. C/o generalized body pain. Denies LOC. Pt ambulatory. Occurred at 715PM today. Pt alert and oriented X4, active, cooperative, pt in NAD. RR even and unlabored, color WNL.  Mechanical fall.

## 2015-03-06 NOTE — ED Provider Notes (Signed)
[facility] Emergency Department Provider Note  ____________________________________________  Time seen: Approximately 10:47 PM  I have reviewed the triage vital signs and the nursing notes.   HISTORY  Chief Complaint Fall    HPI Destiny Wright is a 50 y.o. female patient complaining of generalized body pain secondary to fall approximately 4 feet from a ladder. She states she struck the back of her head on a metal machine but denies any loss of consciousness. Patient also complaining of neck pain but states that she's been diagnosed with arthritis in the neck last week. She also complain of bilateral shoulder pain which increases with overhead reaching. Patient also complain of back pain which increased with flexion. Denies any radicular component to her back pain. Denies any bladder or bowel dysfunction.Patient describes her pain as "achy". Patient rates the pain as 8/10. No palliative measures taken after this fall which occurred at work. Patient has started taking meloxicam and secondary to the arthritis in her neck.   Past Medical History  Diagnosis Date  . Thyroid disease   . Reflux   . Vitamin D deficiency   . Endometriosis   . PVC (premature ventricular contraction)   . Cancer [facility])     Patient Active Problem List   Diagnosis Date Noted  . Endometriosis 08/31/2013    Past Surgical History  Procedure Laterality Date  . Abdominal hysterectomy    . Laparoscopy    . Upper gi endoscopy    . Knee arthroscopy    . Tonsillectomy and adenoidectomy    . Sinusotomy    . Breast surgery      Current Outpatient Rx  Name  Route  Sig  Dispense  Refill  . atorvastatin (LIPITOR) 10 MG tablet   Oral   Take 10 mg by mouth at bedtime.      0   . atorvastatin (LIPITOR) 20 MG tablet   Oral   Take 20 mg by mouth daily.      0   . calcium-vitamin D (CALCIUM 500/D) 500-200 MG-UNIT per tablet   Oral   Take by mouth.         . cholecalciferol  (VITAMIN D) 1000 UNITS tablet   Oral   Take 2,000 Units by mouth daily.         Marland Kitchen estradiol (ESTRACE) 0.5 MG tablet   Oral   Take by mouth.         Marland Kitchen FLUoxetine (PROZAC) 40 MG capsule   Oral   Take 40 mg by mouth daily.      0   . gabapentin (NEURONTIN) 400 MG capsule   Oral   Take 1 capsule (400 mg total) by mouth 2 (two) times daily.   69 capsule   3   . gabapentin (NEURONTIN) 400 MG capsule   Oral   Take 1 capsule (400 mg total) by mouth 3 (three) times daily.   90 capsule   3   . lansoprazole (PREVACID) 15 MG capsule   Oral   Take by mouth.         . levothyroxine (SYNTHROID, LEVOTHROID) 50 MCG tablet   Oral   Take by mouth.         . methocarbamol (ROBAXIN-750) 750 MG tablet   Oral   Take 1 tablet (750 mg total) by mouth 4 (four) times daily.   40 tablet   0   . NONFORMULARY OR COMPOUNDED [place] compound -  Musculosketetal pain or inflammation: Baclofen 3%, Clonidine 0.2%, Diclofenac 4%, Gabapentin 6%, Lidocaine 3%.         . ondansetron (ZOFRAN ODT) 4 MG disintegrating tablet   Oral   Take 1 tablet (4 mg total) by mouth every 8 (eight) hours as needed for nausea or vomiting.   20 tablet   0   . oxyCODONE-acetaminophen (ROXICET) 5-325 MG tablet   Oral   Take 1 tablet by mouth every 4 (four) hours as needed for severe pain.   30 tablet   0   . sertraline (ZOLOFT) 100 MG tablet   Oral   Take 150 mg by mouth daily.          . traMADol (ULTRAM) 50 MG tablet   Oral   Take 1 tablet (50 mg total) by mouth 3 (three) times daily.   90 tablet   2     Allergies Diclofenac and Other  No family history on file.  Social History Social History  Substance Use Topics  . Smoking status: Former Research scientist (life sciences)  . Smokeless tobacco: Never Used     Comment: quit June 19 2004  . Alcohol Use: Yes     Comment: rare    Review of Systems Constitutional: No fever/chills Eyes: No visual changes. ENT: No sore throat. Cardiovascular:  Denies chest pain. Respiratory: Denies shortness of breath. Gastrointestinal: No abdominal pain.  No nausea, no vomiting.  No diarrhea.  No constipation. Genitourinary: Negative for dysuria. Musculoskeletal: Negative for back pain. Skin: Negative for rash. Neurological: Negative for headaches, focal weakness or numbness. Endocrine:Hypothyroidism and hyperlipidemia. Hematological/Lymphatic: Allergic/Immunilogical: Dicolofenac 10-point ROS otherwise negative.  ____________________________________________   PHYSICAL EXAM:  VITAL SIGNS: ED Triage Vitals  Enc Vitals Group     BP 03/06/15 2214 157/95 mmHg     Pulse Rate 03/06/15 2214 58     Resp 03/06/15 2214 18     Temp 03/06/15 2214 98.2 F (36.8 C)     Temp Source 03/06/15 2214 Oral     SpO2 03/06/15 2214 100 %     Weight 03/06/15 2214 213 lb (96.616 kg)     Height 03/06/15 2214 5\' 3"  (1.6 m)     Head Cir --      Peak Flow --      Pain Score 03/06/15 2215 8     Pain Loc --      Pain Edu? --      Excl. in [place]? --     Constitutional: Alert and oriented. Well appearing and in no acute distress. Eyes: Conjunctivae are normal. PERRL. EOMI. Head: Atraumatic. Small hematoma occipital area Nose: No congestion/rhinnorhea. Mouth/Throat: Mucous membranes are moist.  Oropharynx non-erythematous. Neck: No stridor. No cervical spine tenderness to palpation. Decreased left lateral motions secondary to complain of pain Hematological/Lymphatic/Immunilogical: No cervical lymphadenopathy. Cardiovascular: Normal rate, regular rhythm. Grossly normal heart sounds.  Good peripheral circulation. Respiratory: Normal respiratory effort.  No retractions. Lungs CTAB. Gastrointestinal: Soft and nontender. No distention. No abdominal bruits. No CVA tenderness. Musculoskeletal: No lower extremity tenderness nor edema.  No joint effusions. Neurologic:  Normal speech and language. No gross focal neurologic deficits are appreciated. No gait  instability. Skin:  Skin is warm, dry and intact. No rash noted. Psychiatric: Mood and affect are normal. Speech and behavior are normal.  ____________________________________________   LABS (all labs ordered are listed, but only abnormal results are displayed)  Labs Reviewed - No data to display ____________________________________________  EKG   ____________________________________________  RADIOLOGY  ____________________________________________   PROCEDURES  Procedure(s) performed: None  Critical Care performed: No  ____________________________________________   INITIAL IMPRESSION / ASSESSMENT AND PLAN / ED COURSE  Pertinent labs & imaging results that were available during my care of the patient were reviewed by me and considered in my medical decision making (see chart for details). Scalp contusion, myalgia, and arthralgia secondary to fall. Patient given discharge care instructions. Patient advised to continue taking her meloxicam but to start Percocet and Robaxin as directed. Patient advised to follow-up with the current [place] clinic if no improvement in 3 days. ____________________________________________   FINAL CLINICAL IMPRESSION(S) / ED DIAGNOSES  Final diagnoses:  Scalp contusion, initial encounter  Myalgia  Arthralgia       Sable Feil, PA-C 03/06/15 2312  Orbie Pyo, MD 03/06/15 [phone]

## 2015-03-14 ENCOUNTER — Ambulatory Visit (INDEPENDENT_AMBULATORY_CARE_PROVIDER_SITE_OTHER): Payer: Worker's Compensation

## 2015-03-14 ENCOUNTER — Encounter: Payer: Self-pay | Admitting: Emergency Medicine

## 2015-03-14 ENCOUNTER — Ambulatory Visit
Admission: EM | Admit: 2015-03-14 | Discharge: 2015-03-14 | Disposition: A | Discharge status: Home / Self Care | Payer: Worker's Compensation | Attending: Family Medicine | Admitting: Family Medicine

## 2015-03-14 DIAGNOSIS — M546 Pain in thoracic spine: Secondary | ICD-10-CM

## 2015-03-14 DIAGNOSIS — M545 Low back pain, unspecified: Secondary | ICD-10-CM

## 2015-03-14 DIAGNOSIS — S20229A Contusion of unspecified back wall of thorax, initial encounter: Secondary | ICD-10-CM | POA: Diagnosis not present

## 2015-03-14 NOTE — ED Notes (Signed)
Patient states that she fell off ladder on Feb. 15 at work.  Patient c/o ongoing mid back pain that goes up to her shoulders.  Patient was previously seen at [facility] ED.

## 2015-03-14 NOTE — ED Provider Notes (Signed)
CSN: [place]:[id number]     Arrival date & time 03/14/15  1118 History   First MD Initiated Contact with Patient 03/14/15 1310     Chief Complaint  Patient presents with  . Worker's Comp Follow-up   . Back Pain   (Consider location/radiation/quality/duration/timing/severity/associated sxs/prior Treatment) Patient is a 50 y.o. female presenting with back pain. The history is provided by the patient.  Back Pain Location:  Thoracic spine and lumbar spine Quality:  Aching Radiates to:  Does not radiate Pain severity:  Moderate Pain is:  Same all the time Onset quality:  Sudden Duration:  8 days Timing:  Constant Progression:  Unchanged Chronicity:  New Context: falling, occupational injury and recent injury (fell about 3-4 feet off a ladder at work on Feb. 16)   Context: not MVA and not pedestrian accident   Relieved by:  NSAIDs and muscle relaxants Associated symptoms: no abdominal pain, no abdominal swelling, no bladder incontinence, no bowel incontinence, no chest pain, no dysuria, no fever, no headaches, no leg pain, no numbness, no paresthesias, no pelvic pain, no perianal numbness, no tingling, no weakness and no weight loss     Past Medical History  Diagnosis Date  . Thyroid disease   . Reflux   . Vitamin D deficiency   . Endometriosis   . PVC (premature ventricular contraction)   . Cancer [facility])    Past Surgical History  Procedure Laterality Date  . Abdominal hysterectomy    . Laparoscopy    . Upper gi endoscopy    . Knee arthroscopy    . Tonsillectomy and adenoidectomy    . Sinusotomy    . Breast surgery     History reviewed. No pertinent family history. Social History  Substance Use Topics  . Smoking status: Former Research scientist (life sciences)  . Smokeless tobacco: Never Used     Comment: quit June 19 2004  . Alcohol Use: Yes     Comment: rare   OB History    No data available     Review of Systems  Constitutional: Negative for fever and weight loss.  Cardiovascular: Negative for  chest pain.  Gastrointestinal: Negative for abdominal pain and bowel incontinence.  Genitourinary: Negative for bladder incontinence, dysuria and pelvic pain.  Musculoskeletal: Positive for back pain.  Neurological: Negative for tingling, weakness, numbness, headaches and paresthesias.    Allergies  Diclofenac and Other  Home Medications   Prior to Admission medications   Medication Sig Start Date End Date Taking? Authorizing Provider  atorvastatin (LIPITOR) 10 MG tablet Take 10 mg by mouth at bedtime. 12/21/13   Historical Provider, MD  atorvastatin (LIPITOR) 20 MG tablet Take 20 mg by mouth daily. 06/19/14   Historical Provider, MD  calcium-vitamin D (CALCIUM 500/D) 500-200 MG-UNIT per tablet Take by mouth.    Historical Provider, MD  cholecalciferol (VITAMIN D) 1000 UNITS tablet Take 2,000 Units by mouth daily.    Historical Provider, MD  estradiol (ESTRACE) 0.5 MG tablet Take by mouth. 05/05/13   Historical Provider, MD  FLUoxetine (PROZAC) 40 MG capsule Take 40 mg by mouth daily. 05/28/14   Historical Provider, MD  gabapentin (NEURONTIN) 400 MG capsule Take 1 capsule (400 mg total) by mouth 2 (two) times daily. 03/27/14   Wallene Huh, DPM  gabapentin (NEURONTIN) 400 MG capsule Take 1 capsule (400 mg total) by mouth 3 (three) times daily. 06/20/14   Wallene Huh, DPM  lansoprazole (PREVACID) 15 MG capsule Take by mouth.    Historical Provider, MD  levothyroxine (SYNTHROID, LEVOTHROID) 50 MCG tablet Take by mouth. 05/18/13   Historical Provider, MD  methocarbamol (ROBAXIN-750) 750 MG tablet Take 1 tablet (750 mg total) by mouth 4 (four) times daily. 03/06/15   Sable Feil, PA-C  NONFORMULARY OR COMPOUNDED [place] compound - Musculosketetal pain or inflammation: Baclofen 3%, Clonidine 0.2%, Diclofenac 4%, Gabapentin 6%, Lidocaine 3%.    Tamala Fothergill Regal, DPM  ondansetron (ZOFRAN ODT) 4 MG disintegrating tablet Take 1 tablet (4 mg total) by mouth every 8 (eight) hours as needed for  nausea or vomiting. 01/23/15   Harvest Dark, MD  oxyCODONE-acetaminophen (ROXICET) 5-325 MG tablet Take 1 tablet by mouth every 4 (four) hours as needed for severe pain. 03/06/15   Sable Feil, PA-C  sertraline (ZOLOFT) 100 MG tablet Take 150 mg by mouth daily.     Historical Provider, MD  traMADol (ULTRAM) 50 MG tablet Take 1 tablet (50 mg total) by mouth 3 (three) times daily. 02/01/14   Wallene Huh, DPM   Meds Ordered and Administered this Visit  Medications - No data to display  BP 118/65 mmHg  Pulse 57  Temp(Src) 97 F (36.1 C) (Tympanic)  Resp 16  Ht 5\' 3"  (1.6 m)  Wt 221 lb (100.245 kg)  BMI 39.16 kg/m2  SpO2 99% No data found.   Physical Exam  Constitutional: She appears well-developed and well-nourished. No distress.  Musculoskeletal: She exhibits tenderness. She exhibits no edema.       Thoracic back: She exhibits tenderness and spasm. She exhibits normal range of motion, no bony tenderness, no swelling, no edema, no deformity, no laceration and normal pulse.       Lumbar back: She exhibits tenderness and spasm. She exhibits normal range of motion, no bony tenderness, no swelling, no edema, no deformity, no laceration and normal pulse.  Neurological: She is alert. She has normal reflexes. She exhibits normal muscle tone.  Skin: Skin is warm and dry. No rash noted. She is not diaphoretic. No erythema.  Nursing note and vitals reviewed.   ED Course  Procedures (including critical care time)  Labs Review Labs Reviewed - No data to display  Imaging Review Dg Thoracic Spine 2 View  03/14/2015  CLINICAL DATA:  Fall from ladder eight days ago with persistent back pain, initial encounter EXAM: THORACIC SPINE 2 VIEWS COMPARISON:  None. FINDINGS: Postsurgical changes are noted in the lower cervical spine. Osteophytic changes are noted within the lower thoracic spine. No rib fracture or pneumothorax is noted. No acute compression deformity is noted. IMPRESSION:  Degenerative changes without acute abnormality. Electronically Signed   By: Inez Catalina M.D.   On: 03/14/2015 13:59   Dg Lumbar Spine Complete  03/14/2015  CLINICAL DATA:  Fall from ladder 03/06/2015 continued mid to lower back pain. EXAM: LUMBAR SPINE - COMPLETE 4+ VIEW COMPARISON:  CT 01/23/2015 FINDINGS: Degenerative spurring in the lower thoracic spine. Normal alignment. No fracture. SI joints are symmetric and unremarkable. IMPRESSION: No acute bony abnormality. Electronically Signed   By: Rolm Baptise M.D.   On: 03/14/2015 13:55     Visual Acuity Review  Right Eye Distance:   Left Eye Distance:   Bilateral Distance:    Right Eye Near:   Left Eye Near:    Bilateral Near:         MDM   1. Contusion, back, unspecified laterality, initial encounter   2. Midline thoracic back pain   3. Midline low back pain without sciatica  Discharge Medication List as of 03/14/2015  2:41 PM     1. x-ray results and diagnosis reviewed with patient 2. rx as per orders above; reviewed possible side effects, interactions, risks and benefits; rx sent for flexeril 3. Recommend supportive treatment with otc analgesics prn 4. Follow-up at [facility] building occupational health prn  Norval Gable, MD 03/14/15 2218

## 2016-01-19 DIAGNOSIS — R011 Cardiac murmur, unspecified: Secondary | ICD-10-CM

## 2016-01-19 HISTORY — DX: Cardiac murmur, unspecified: R01.1

## 2016-05-14 DIAGNOSIS — R011 Cardiac murmur, unspecified: Secondary | ICD-10-CM | POA: Insufficient documentation

## 2016-05-14 DIAGNOSIS — I493 Ventricular premature depolarization: Secondary | ICD-10-CM | POA: Insufficient documentation

## 2016-09-06 ENCOUNTER — Ambulatory Visit
Admission: RE | Admit: 2016-09-06 | Discharge: 2016-09-06 | Disposition: A | Discharge status: Home / Self Care | Payer: Disability Insurance | Source: Ambulatory Visit | Attending: Internal Medicine | Admitting: Internal Medicine

## 2016-09-06 ENCOUNTER — Other Ambulatory Visit: Payer: Self-pay | Admitting: Internal Medicine

## 2016-09-06 DIAGNOSIS — Z85828 Personal history of other malignant neoplasm of skin: Secondary | ICD-10-CM | POA: Insufficient documentation

## 2016-09-06 DIAGNOSIS — M1990 Unspecified osteoarthritis, unspecified site: Secondary | ICD-10-CM

## 2016-09-06 DIAGNOSIS — F419 Anxiety disorder, unspecified: Secondary | ICD-10-CM | POA: Diagnosis not present

## 2016-09-06 DIAGNOSIS — F429 Obsessive-compulsive disorder, unspecified: Secondary | ICD-10-CM | POA: Insufficient documentation

## 2016-09-06 DIAGNOSIS — M47816 Spondylosis without myelopathy or radiculopathy, lumbar region: Secondary | ICD-10-CM | POA: Insufficient documentation

## 2016-09-06 DIAGNOSIS — F329 Major depressive disorder, single episode, unspecified: Secondary | ICD-10-CM | POA: Insufficient documentation

## 2016-09-06 DIAGNOSIS — K219 Gastro-esophageal reflux disease without esophagitis: Secondary | ICD-10-CM | POA: Insufficient documentation

## 2016-09-06 DIAGNOSIS — E079 Disorder of thyroid, unspecified: Secondary | ICD-10-CM | POA: Insufficient documentation

## 2016-09-06 DIAGNOSIS — M85641 Other cyst of bone, right hand: Secondary | ICD-10-CM | POA: Diagnosis not present

## 2016-09-06 DIAGNOSIS — E7800 Pure hypercholesterolemia, unspecified: Secondary | ICD-10-CM | POA: Diagnosis not present

## 2017-04-08 ENCOUNTER — Emergency Department
Admission: EM | Admit: 2017-04-08 | Discharge: 2017-04-08 | Disposition: A | Discharge status: Home / Self Care | Payer: BLUE CROSS/BLUE SHIELD | Attending: Emergency Medicine | Admitting: Emergency Medicine

## 2017-04-08 ENCOUNTER — Emergency Department: Payer: BLUE CROSS/BLUE SHIELD

## 2017-04-08 ENCOUNTER — Encounter: Payer: Self-pay | Admitting: Emergency Medicine

## 2017-04-08 ENCOUNTER — Other Ambulatory Visit: Payer: Self-pay

## 2017-04-08 DIAGNOSIS — Z87891 Personal history of nicotine dependence: Secondary | ICD-10-CM | POA: Insufficient documentation

## 2017-04-08 DIAGNOSIS — K5792 Diverticulitis of intestine, part unspecified, without perforation or abscess without bleeding: Secondary | ICD-10-CM | POA: Diagnosis not present

## 2017-04-08 DIAGNOSIS — R109 Unspecified abdominal pain: Secondary | ICD-10-CM | POA: Diagnosis present

## 2017-04-08 DIAGNOSIS — Z859 Personal history of malignant neoplasm, unspecified: Secondary | ICD-10-CM | POA: Diagnosis not present

## 2017-04-08 LAB — CBC
HCT: 40.5 % (ref 35.0–47.0)
Hemoglobin: 13.2 g/dL (ref 12.0–16.0)
MCH: 28.3 pg (ref 26.0–34.0)
MCHC: 32.7 g/dL (ref 32.0–36.0)
MCV: 86.7 fL (ref 80.0–100.0)
Platelets: 307 10*3/uL (ref 150–440)
RBC: 4.67 MIL/uL (ref 3.80–5.20)
RDW: 14.3 % (ref 11.5–14.5)
WBC: 12.6 10*3/uL — ABNORMAL HIGH (ref 3.6–11.0)

## 2017-04-08 LAB — COMPREHENSIVE METABOLIC PANEL
ALT: 37 U/L (ref 14–54)
AST: 34 U/L (ref 15–41)
Albumin: 4.3 g/dL (ref 3.5–5.0)
Alkaline Phosphatase: 91 U/L (ref 38–126)
Anion gap: 9 (ref 5–15)
BUN: 17 mg/dL (ref 6–20)
CO2: 25 mmol/L (ref 22–32)
Calcium: 9.9 mg/dL (ref 8.9–10.3)
Chloride: 102 mmol/L (ref 101–111)
Creatinine, Ser: 1.07 mg/dL — ABNORMAL HIGH (ref 0.44–1.00)
GFR calc Af Amer: >60 mL/min (ref >60)
GFR calc non Af Amer: 59 mL/min — ABNORMAL LOW (ref >60)
Glucose, Bld: 90 mg/dL (ref 65–99)
Potassium: 4.1 mmol/L (ref 3.5–5.1)
Sodium: 136 mmol/L (ref 135–145)
Total Bilirubin: 0.5 mg/dL (ref 0.3–1.2)
Total Protein: 8.6 g/dL — ABNORMAL HIGH (ref 6.5–8.1)

## 2017-04-08 LAB — URINALYSIS, COMPLETE (UACMP) WITH MICROSCOPIC
Bacteria, UA: NONE SEEN
Bilirubin Urine: NEGATIVE
Glucose, UA: NEGATIVE mg/dL
Hgb urine dipstick: NEGATIVE
Ketones, ur: NEGATIVE mg/dL
Leukocytes, UA: NEGATIVE
Nitrite: NEGATIVE
Protein, ur: NEGATIVE mg/dL
RBC / HPF: NONE SEEN RBC/hpf (ref 0–5)
Specific Gravity, Urine: 1.015 (ref 1.005–1.030)
pH: 6 (ref 5.0–8.0)

## 2017-04-08 LAB — LIPASE, BLOOD: Lipase: 43 U/L (ref 11–51)

## 2017-04-08 MED ORDER — FLUCONAZOLE 50 MG PO TABS
150.0000 mg | ORAL_TABLET | Freq: Once | ORAL | Status: AC
  Administered 2017-04-08: 150 mg via ORAL
  Filled 2017-04-08: qty 3

## 2017-04-08 MED ORDER — CIPROFLOXACIN HCL 500 MG PO TABS
500.0000 mg | ORAL_TABLET | Freq: Once | ORAL | Status: AC
  Administered 2017-04-08: 500 mg via ORAL
  Filled 2017-04-08: qty 1

## 2017-04-08 MED ORDER — OXYCODONE-ACETAMINOPHEN 5-325 MG PO TABS: 1.0000 | ORAL_TABLET | Freq: Three times a day (TID) | ORAL | 0 refills | Status: DC | PRN

## 2017-04-08 MED ORDER — METRONIDAZOLE 500 MG PO TABS
500.0000 mg | ORAL_TABLET | Freq: Once | ORAL | Status: AC
  Administered 2017-04-08: 500 mg via ORAL
  Filled 2017-04-08: qty 1

## 2017-04-08 MED ORDER — METRONIDAZOLE 500 MG PO TABS: 500.0000 mg | ORAL_TABLET | Freq: Three times a day (TID) | ORAL | 0 refills | Status: DC

## 2017-04-08 MED ORDER — CIPROFLOXACIN HCL 500 MG PO TABS: 500.0000 mg | ORAL_TABLET | Freq: Two times a day (BID) | ORAL | 0 refills | Status: AC

## 2017-04-08 NOTE — ED Triage Notes (Signed)
Pt arrives ambulatory to triage with c/o lower left sided abdominal pain which radiates into his lower pelvis. Pt reports being diagnosed with a UTI at [facility] clinic on Monday and given an antibiotic. Pt reports that this pain is different. Pt is in NAD.

## 2017-04-08 NOTE — ED Provider Notes (Signed)
[facility] Emergency Department Provider Note       Time seen: ----------------------------------------- 10:20 PM on 04/08/2017 -----------------------------------------   I have reviewed the triage vital signs and the nursing notes.  HISTORY   Chief Complaint Abdominal Pain    HPI Destiny Wright is a 52 y.o. female with a history of cancer, endometriosis, GERD, thyroid disease who presents to the ED for left-sided abdominal pain that radiates into the pelvis.  Patient reports being diagnosed with a UTI at [facility] on Monday and was given an antibiotic.  Patient reports that this pain is different.  Nothing makes it better or worse.  There is a reported history of diverticulitis in the distant past.  Past Medical History:  Diagnosis Date  . Cancer ([place])   . Endometriosis   . PVC (premature ventricular contraction)   . Reflux   . Thyroid disease   . Vitamin D deficiency     Patient Active Problem List   Diagnosis Date Noted  . Endometriosis 08/31/2013    Past Surgical History:  Procedure Laterality Date  . ABDOMINAL HYSTERECTOMY    . BREAST SURGERY    . KNEE ARTHROSCOPY    . LAPAROSCOPY    . SINUSOTOMY    . TONSILLECTOMY AND ADENOIDECTOMY    . UPPER GI ENDOSCOPY      Allergies Diclofenac and Other  Social History Social History   Tobacco Use  . Smoking status: Former Research scientist (life sciences)  . Smokeless tobacco: Never Used  . Tobacco comment: quit June 19 2004  Substance Use Topics  . Alcohol use: Yes    Comment: rare  . Drug use: No    Review of Systems Constitutional: Negative for fever. Cardiovascular: Negative for chest pain. Respiratory: Negative for shortness of breath. Gastrointestinal: Positive for abdominal pain, negative for vomiting and diarrhea Genitourinary: Negative for dysuria. Musculoskeletal: Negative for back pain. Skin: Negative for rash. Neurological: Negative for headaches, focal weakness or numbness.  All  systems negative/normal/unremarkable except as stated in the HPI  ____________________________________________   PHYSICAL EXAM:  VITAL SIGNS: ED Triage Vitals  Enc Vitals Group     BP 04/08/17 2014 (!) 149/79     Pulse Rate 04/08/17 2014 90     Resp 04/08/17 2014 18     Temp 04/08/17 2014 98.9 F (37.2 C)     Temp Source 04/08/17 2014 Oral     SpO2 04/08/17 2014 98 %     Weight 04/08/17 2015 228 lb (103.4 kg)     Height 04/08/17 2015 5\' 3"  (1.6 m)     Head Circumference --      Peak Flow --      Pain Score 04/08/17 2015 6     Pain Loc --      Pain Edu? --      Excl. in [place]? --    Constitutional: Alert and oriented. Well appearing and in no distress. Eyes: Conjunctivae are normal. Normal extraocular movements. ENT   Head: Normocephalic and atraumatic.   Nose: No congestion/rhinnorhea.   Mouth/Throat: Mucous membranes are moist.   Neck: No stridor. Cardiovascular: Normal rate, regular rhythm. No murmurs, rubs, or gallops. Respiratory: Normal respiratory effort without tachypnea nor retractions. Breath sounds are clear and equal bilaterally. No wheezes/rales/rhonchi. Gastrointestinal: Left flank tenderness, no rebound or guarding.  Normal bowel sounds. Musculoskeletal: Nontender with normal range of motion in extremities. No lower extremity tenderness nor edema. Neurologic:  Normal speech and language. No gross focal neurologic deficits are appreciated.  Skin:  Skin is warm, dry and intact. No rash noted. Psychiatric: Mood and affect are normal. Speech and behavior are normal.  ____________________________________________  ED COURSE:  As part of my medical decision making, I reviewed the following data within the [place] History obtained from family if available, nursing notes, old chart and ekg, as well as notes from prior ED visits. Patient presented for left flank pain, we will assess with labs and imaging as indicated at this time.    Procedures ____________________________________________   LABS (pertinent positives/negatives)  Labs Reviewed  COMPREHENSIVE METABOLIC PANEL - Abnormal; Notable for the following components:      Result Value   Creatinine, Ser 1.07 (*)    Total Protein 8.6 (*)    GFR calc non Af Amer 59 (*)    All other components within normal limits  CBC - Abnormal; Notable for the following components:   WBC 12.6 (*)    All other components within normal limits  URINALYSIS, COMPLETE (UACMP) WITH MICROSCOPIC - Abnormal; Notable for the following components:   Color, Urine YELLOW (*)    APPearance HAZY (*)    Squamous Epithelial / LPF 0-5 (*)    All other components within normal limits  LIPASE, BLOOD    RADIOLOGY Images were viewed by me  CT renal protocol  IMPRESSION: 1. Focal wall thickening with moderate surrounding inflammation at the distal descending colon/proximal sigmoid colon with a few diverticula present, findings favored to represent acute diverticulitis. No extraluminal gas. No abscess. 2. There are no other acute abnormalities visualized. ____________________________________________  DIFFERENTIAL DIAGNOSIS   Diverticulitis, constipation, renal colic, UTI, pyelonephritis  FINAL ASSESSMENT AND PLAN  Diverticulitis   Plan: The patient had presented for left flank pain. Patient's labs revealed mild leukocytosis but were otherwise unremarkable. Patient's imaging did reveal diverticulitis without perforation or abscess.  Patient was given Cipro and Flagyl and is stable for outpatient follow-up.   Laurence Aly, MD   Note: This note was generated in part or whole with voice recognition software. Voice recognition is usually quite accurate but there are transcription errors that can and very often do occur. I apologize for any typographical errors that were not detected and corrected.     Earleen Newport, MD 04/08/17 2232

## 2019-01-19 DIAGNOSIS — Z8619 Personal history of other infectious and parasitic diseases: Secondary | ICD-10-CM

## 2019-01-19 HISTORY — DX: Personal history of other infectious and parasitic diseases: Z86.19

## 2019-09-25 ENCOUNTER — Other Ambulatory Visit: Payer: Self-pay

## 2019-09-25 ENCOUNTER — Emergency Department: Payer: Medicare HMO

## 2019-09-25 ENCOUNTER — Encounter: Payer: Self-pay | Admitting: Emergency Medicine

## 2019-09-25 DIAGNOSIS — Z79890 Hormone replacement therapy (postmenopausal): Secondary | ICD-10-CM | POA: Diagnosis not present

## 2019-09-25 DIAGNOSIS — E039 Hypothyroidism, unspecified: Secondary | ICD-10-CM | POA: Insufficient documentation

## 2019-09-25 DIAGNOSIS — K219 Gastro-esophageal reflux disease without esophagitis: Secondary | ICD-10-CM | POA: Diagnosis not present

## 2019-09-25 DIAGNOSIS — K529 Noninfective gastroenteritis and colitis, unspecified: Secondary | ICD-10-CM | POA: Diagnosis not present

## 2019-09-25 DIAGNOSIS — R109 Unspecified abdominal pain: Secondary | ICD-10-CM | POA: Diagnosis present

## 2019-09-25 DIAGNOSIS — K2980 Duodenitis without bleeding: Secondary | ICD-10-CM | POA: Diagnosis not present

## 2019-09-25 DIAGNOSIS — Z87891 Personal history of nicotine dependence: Secondary | ICD-10-CM | POA: Diagnosis not present

## 2019-09-25 LAB — TROPONIN I (HIGH SENSITIVITY): Troponin I (High Sensitivity): 4 ng/L (ref <18)

## 2019-09-25 LAB — URINALYSIS, COMPLETE (UACMP) WITH MICROSCOPIC
Bilirubin Urine: NEGATIVE
Glucose, UA: NEGATIVE mg/dL
Hgb urine dipstick: NEGATIVE
Ketones, ur: NEGATIVE mg/dL
Nitrite: NEGATIVE
Protein, ur: NEGATIVE mg/dL
Specific Gravity, Urine: 1.021 (ref 1.005–1.030)
pH: 6 (ref 5.0–8.0)

## 2019-09-25 LAB — COMPREHENSIVE METABOLIC PANEL
ALT: 17 U/L (ref 0–44)
AST: 17 U/L (ref 15–41)
Albumin: 4.4 g/dL (ref 3.5–5.0)
Alkaline Phosphatase: 68 U/L (ref 38–126)
Anion gap: 11 (ref 5–15)
BUN: 10 mg/dL (ref 6–20)
CO2: 26 mmol/L (ref 22–32)
Calcium: 10.7 mg/dL — ABNORMAL HIGH (ref 8.9–10.3)
Chloride: 100 mmol/L (ref 98–111)
Creatinine, Ser: 1.46 mg/dL — ABNORMAL HIGH (ref 0.44–1.00)
GFR calc Af Amer: 47 mL/min — ABNORMAL LOW (ref >60)
GFR calc non Af Amer: 41 mL/min — ABNORMAL LOW (ref >60)
Glucose, Bld: 101 mg/dL — ABNORMAL HIGH (ref 70–99)
Potassium: 3.7 mmol/L (ref 3.5–5.1)
Sodium: 137 mmol/L (ref 135–145)
Total Bilirubin: 0.7 mg/dL (ref 0.3–1.2)
Total Protein: 8.1 g/dL (ref 6.5–8.1)

## 2019-09-25 LAB — CBC WITH DIFFERENTIAL/PLATELET
Abs Immature Granulocytes: 0.03 10*3/uL (ref 0.00–0.07)
Basophils Absolute: 0 10*3/uL (ref 0.0–0.1)
Basophils Relative: 0 %
Eosinophils Absolute: 0.7 10*3/uL — ABNORMAL HIGH (ref 0.0–0.5)
Eosinophils Relative: 8 %
HCT: 44.5 % (ref 36.0–46.0)
Hemoglobin: 15.2 g/dL — ABNORMAL HIGH (ref 12.0–15.0)
Immature Granulocytes: 0 %
Lymphocytes Relative: 22 %
Lymphs Abs: 2 10*3/uL (ref 0.7–4.0)
MCH: 30.8 pg (ref 26.0–34.0)
MCHC: 34.2 g/dL (ref 30.0–36.0)
MCV: 90.3 fL (ref 80.0–100.0)
Monocytes Absolute: 0.4 10*3/uL (ref 0.1–1.0)
Monocytes Relative: 5 %
Neutro Abs: 5.8 10*3/uL (ref 1.7–7.7)
Neutrophils Relative %: 65 %
Platelets: 288 10*3/uL (ref 150–400)
RBC: 4.93 MIL/uL (ref 3.87–5.11)
RDW: 12.9 % (ref 11.5–15.5)
WBC: 9 10*3/uL (ref 4.0–10.5)
nRBC: 0 % (ref 0.0–0.2)

## 2019-09-25 LAB — LIPASE, BLOOD: Lipase: 54 U/L — ABNORMAL HIGH (ref 11–51)

## 2019-09-25 NOTE — ED Triage Notes (Signed)
Pt to triage via w/c with no distress noted; pt reports upper abd and chest pain for few wks accomp by nausea; st hx diverticulitis

## 2019-09-26 ENCOUNTER — Emergency Department
Admission: EM | Admit: 2019-09-26 | Discharge: 2019-09-26 | Disposition: A | Discharge status: Home / Self Care | Payer: Medicare HMO | Attending: Emergency Medicine | Admitting: Emergency Medicine

## 2019-09-26 ENCOUNTER — Emergency Department: Payer: Medicare HMO

## 2019-09-26 DIAGNOSIS — K2980 Duodenitis without bleeding: Secondary | ICD-10-CM

## 2019-09-26 DIAGNOSIS — K2100 Gastro-esophageal reflux disease with esophagitis, without bleeding: Secondary | ICD-10-CM

## 2019-09-26 DIAGNOSIS — K529 Noninfective gastroenteritis and colitis, unspecified: Secondary | ICD-10-CM

## 2019-09-26 LAB — TROPONIN I (HIGH SENSITIVITY): Troponin I (High Sensitivity): 6 ng/L (ref <18)

## 2019-09-26 MED ORDER — FAMOTIDINE 20 MG PO TABS: 20.0000 mg | ORAL_TABLET | Freq: Two times a day (BID) | ORAL | 1 refills | Status: DC

## 2019-09-26 MED ORDER — ONDANSETRON HCL 4 MG/2ML IJ SOLN
4.0000 mg | Freq: Once | INTRAMUSCULAR | Status: AC
  Administered 2019-09-26: 4 mg via INTRAVENOUS
  Filled 2019-09-26: qty 2

## 2019-09-26 MED ORDER — FAMOTIDINE IN NACL 20-0.9 MG/50ML-% IV SOLN: 20.0000 mg | Freq: Once | INTRAVENOUS | Status: DC

## 2019-09-26 MED ORDER — IOHEXOL 9 MG/ML PO SOLN: 500.0000 mL | ORAL | Status: AC

## 2019-09-26 MED ORDER — ONDANSETRON HCL 4 MG/2ML IJ SOLN: 4.0000 mg | Freq: Once | INTRAMUSCULAR | Status: DC | PRN

## 2019-09-26 MED ORDER — IOHEXOL 300 MG/ML  SOLN
75.0000 mL | Freq: Once | INTRAMUSCULAR | Status: AC | PRN
  Administered 2019-09-26: 75 mL via INTRAVENOUS

## 2019-09-26 MED ORDER — PANTOPRAZOLE SODIUM 40 MG IV SOLR
40.0000 mg | Freq: Once | INTRAVENOUS | Status: AC
  Administered 2019-09-26: 40 mg via INTRAVENOUS

## 2019-09-26 NOTE — ED Notes (Signed)
EDP, Bradler at bedside.

## 2019-09-26 NOTE — ED Notes (Signed)
PO challenge provided per EDP request.

## 2019-09-26 NOTE — ED Provider Notes (Signed)
[facility] Emergency Department Provider Note   ____________________________________________   First MD Initiated Contact with Patient 09/26/19 1158     (approximate)  I have reviewed the triage vital signs and the nursing notes.   HISTORY  Chief Complaint Abdominal Pain    HPI Destiny Wright is a 54 y.o. adult with stated past medical history of diverticulosis and GERD who presents for abdominal pain, diarrhea, and the sensation of difficulty swallowing and worsening over the past 3 days.  Patient states that he has had associated substernal burning pain intermittently over this time.  Patient states that he has been compliant with his reflux medications felt "the food has been getting stuck in my throat".  Patient also states that he had some burning, nonradiating, 6/10 left-sided abdominal pain today that did not resolve after approximately 6 hours and was worsened with p.o. intake.  Patient denies any relieving factors.         Past Medical History:  Diagnosis Date  . Cancer ([place])   . Endometriosis   . PVC (premature ventricular contraction)   . Reflux   . Thyroid disease   . Vitamin D deficiency     Patient Active Problem List   Diagnosis Date Noted  . Endometriosis 08/31/2013    Past Surgical History:  Procedure Laterality Date  . ABDOMINAL HYSTERECTOMY    . BREAST SURGERY    . KNEE ARTHROSCOPY    . LAPAROSCOPY    . SINUSOTOMY    . TONSILLECTOMY AND ADENOIDECTOMY    . UPPER GI ENDOSCOPY      Prior to Admission medications   Medication Sig Start Date End Date Taking? Authorizing Provider  atorvastatin (LIPITOR) 10 MG tablet Take 10 mg by mouth at bedtime. 12/21/13   [provider]  atorvastatin (LIPITOR) 20 MG tablet Take 20 mg by mouth daily. 06/19/14   [provider]  calcium-vitamin D (CALCIUM 500/D) 500-200 MG-UNIT per tablet Take by mouth.    [provider]  cholecalciferol (VITAMIN D) 1000 UNITS  tablet Take 2,000 Units by mouth daily.    [provider]  estradiol (ESTRACE) 0.5 MG tablet Take by mouth. 05/05/13   [provider]  FLUoxetine (PROZAC) 40 MG capsule Take 40 mg by mouth daily. 05/28/14   [provider]  gabapentin (NEURONTIN) 400 MG capsule Take 1 capsule (400 mg total) by mouth 2 (two) times daily. 03/27/14   Wallene Huh, DPM  gabapentin (NEURONTIN) 400 MG capsule Take 1 capsule (400 mg total) by mouth 3 (three) times daily. 06/20/14   Wallene Huh, DPM  lansoprazole (PREVACID) 15 MG capsule Take by mouth.    [provider]  levothyroxine (SYNTHROID, LEVOTHROID) 50 MCG tablet Take by mouth. 05/18/13   [provider]  methocarbamol (ROBAXIN-750) 750 MG tablet Take 1 tablet (750 mg total) by mouth 4 (four) times daily. 03/06/15   Sable Feil, PA-C  metroNIDAZOLE (FLAGYL) 500 MG tablet Take 1 tablet (500 mg total) by mouth 3 (three) times daily. 04/08/17   Earleen Newport, MD  NONFORMULARY OR COMPOUNDED [place] compound - Musculosketetal pain or inflammation: Baclofen 3%, Clonidine 0.2%, Diclofenac 4%, Gabapentin 6%, Lidocaine 3%.    Wallene Huh, DPM  ondansetron (ZOFRAN ODT) 4 MG disintegrating tablet Take 1 tablet (4 mg total) by mouth every 8 (eight) hours as needed for nausea or vomiting. 01/23/15   Harvest Dark, MD  oxyCODONE-acetaminophen (PERCOCET) 5-325 MG tablet Take 1-2 tablets  by mouth every 8 (eight) hours as needed. 04/08/17   Earleen Newport, MD  oxyCODONE-acetaminophen (ROXICET) 5-325 MG tablet Take 1 tablet by mouth every 4 (four) hours as needed for severe pain. 03/06/15   Sable Feil, PA-C  sertraline (ZOLOFT) 100 MG tablet Take 150 mg by mouth daily.     [provider]  traMADol (ULTRAM) 50 MG tablet Take 1 tablet (50 mg total) by mouth 3 (three) times daily. 02/01/14   Wallene Huh, DPM    Allergies Diclofenac and Other  No family history on file.  Social  History Social History   Tobacco Use  . Smoking status: Former Research scientist (life sciences)  . Smokeless tobacco: Never Used  . Tobacco comment: quit June 19 2004  Vaping Use  . Vaping Use: Every day  Substance Use Topics  . Alcohol use: Yes    Comment: rare  . Drug use: No    Review of Systems Constitutional: No fever/chills Eyes: No visual changes. ENT: No sore throat. Cardiovascular: Denies chest pain. Respiratory: Denies shortness of breath. Gastrointestinal: No constipation Genitourinary: Negative for dysuria. Musculoskeletal: Negative for acute arthralgias Skin: Negative for rash. Neurological: Negative for headaches, weakness/numbness/paresthesias in any extremity Psychiatric: Negative for suicidal ideation/homicidal ideation   ____________________________________________   PHYSICAL EXAM:  VITAL SIGNS: ED Triage Vitals  Enc Vitals Group     BP 09/25/19 2030 (!) 160/87     Pulse Rate 09/25/19 2030 70     Resp 09/25/19 2030 20     Temp 09/25/19 2030 99 F (37.2 C)     Temp Source 09/25/19 2030 Oral     SpO2 09/25/19 2030 95 %     Weight 09/25/19 2035 221 lb (100.2 kg)     Height 09/25/19 2035 5\' 3"  (1.6 m)     Head Circumference --      Peak Flow --      Pain Score 09/25/19 2035 9     Pain Loc --      Pain Edu? --      Excl. in [place]? --    Constitutional: Alert and oriented. Well appearing and in no acute distress. Eyes: Conjunctivae are normal. PERRL. EOMI. Head: Atraumatic. Nose: No congestion/rhinnorhea. Mouth/Throat: Mucous membranes are moist.  Oropharynx non-erythematous. Neck: No stridor.   Cardiovascular: Normal rate, regular rhythm. Grossly normal heart sounds.  Good peripheral circulation. Respiratory: Normal respiratory effort.  No retractions. Lungs CTAB. Gastrointestinal: Soft and nontender. No distention. No abdominal bruits. No CVA tenderness. Musculoskeletal: No lower extremity tenderness nor edema.  No joint effusions. Neurologic:  Normal speech and  language. No gross focal neurologic deficits are appreciated. No gait instability. Skin:  Skin is warm, dry and intact. No rash noted. Psychiatric: Mood and affect are normal. Speech and behavior are normal.  ____________________________________________   LABS (all labs ordered are listed, but only abnormal results are displayed)  Labs Reviewed  CBC WITH DIFFERENTIAL/PLATELET - Abnormal; Notable for the following components:      Result Value   Hemoglobin 15.2 (*)    Eosinophils Absolute 0.7 (*)    All other components within normal limits  COMPREHENSIVE METABOLIC PANEL - Abnormal; Notable for the following components:   Glucose, Bld 101 (*)    Creatinine, Ser 1.46 (*)    Calcium 10.7 (*)    GFR calc non Af Amer 41 (*)    GFR calc Af Amer 47 (*)    All other components within normal limits  LIPASE, BLOOD - Abnormal; Notable  for the following components:   Lipase 54 (*)    All other components within normal limits  URINALYSIS, COMPLETE (UACMP) WITH MICROSCOPIC - Abnormal; Notable for the following components:   Color, Urine YELLOW (*)    APPearance HAZY (*)    Leukocytes,Ua TRACE (*)    Bacteria, UA RARE (*)    All other components within normal limits  TROPONIN I (HIGH SENSITIVITY)  TROPONIN I (HIGH SENSITIVITY)   ____________________________________________  EKG  ____________________________________________  RADIOLOGY  ED MD interpretation: Wall thickening and distention of the duodenum and proximal jejunum concerning for enteritis versus duodenitis  Official radiology report(s): DG Chest 2 View  Result Date: 09/25/2019 CLINICAL DATA:  Upper abdominal and chest pain for several weeks, nausea EXAM: CHEST - 2 VIEW COMPARISON:  09/24/2006 FINDINGS: The heart size and mediastinal contours are within normal limits. Both lungs are clear. The visualized skeletal structures are unremarkable. IMPRESSION: No active cardiopulmonary disease. Electronically Signed   By: Randa Ngo M.D.   On: 09/25/2019 21:01   CT Abdomen Pelvis W Contrast  Result Date: 09/26/2019 CLINICAL DATA:  Left abdominal pain for 24 hours. Suspected diverticulitis. History of previous diverticulitis. Nausea. EXAM: CT ABDOMEN AND PELVIS WITH CONTRAST TECHNIQUE: Multidetector CT imaging of the abdomen and pelvis was performed using the standard protocol following bolus administration of intravenous contrast. CONTRAST:  58mL OMNIPAQUE IOHEXOL 300 MG/ML  SOLN COMPARISON:  CT renal stone study 04/08/2017 FINDINGS: Lower chest: Lung bases are clear. Hepatobiliary: No focal liver abnormality is seen. No gallstones, gallbladder wall thickening, or biliary dilatation. Pancreas: Unremarkable. No pancreatic ductal dilatation or surrounding inflammatory changes. Spleen: Normal in size without focal abnormality. Adrenals/Urinary Tract: Adrenal glands are unremarkable. Kidneys are normal, without renal calculi, focal lesion, or hydronephrosis. Bladder is unremarkable. Stomach/Bowel: The stomach is moderately distended with mild dilatation of the esophagus suggesting reflux. The duodenum and proximal jejunum demonstrate prominent wall thickening with mild distention. Infiltration in the surrounding mesentery. Changes suggest enteritis and duodenitis. Small to moderate abdominal and pelvic free fluid is likely reactive. Mesenteric vessels are patent and there is no pneumatosis to suggest ischemia. Mild mesenteric edema. The colon is decompressed. Scattered colonic diverticula. No evidence of diverticulitis. The appendix is normal. Vascular/Lymphatic: No significant vascular findings are present. No enlarged abdominal or pelvic lymph nodes. Reproductive: Uterus and bilateral adnexa are unremarkable. Other: No free air in the abdomen. Abdominal wall musculature appears intact. Musculoskeletal: No acute or significant osseous findings. IMPRESSION: Prominent wall thickening and mild distention of the duodenum and proximal jejunum  with infiltration in the surrounding mesentery. Changes suggest enteritis and duodenitis. Small to moderate abdominal and pelvic free fluid is likely reactive. Mesenteric vessels are patent and there is no pneumatosis to suggest ischemia. Electronically Signed   By: Lucienne Capers M.D.   On: 09/26/2019 02:53    ____________________________________________   PROCEDURES  Procedure(s) performed (including Critical Care):  Procedures   ____________________________________________   INITIAL IMPRESSION / ASSESSMENT AND PLAN / ED COURSE        The cause of the patient's symptoms likely combination of reflux and enteritis, but the patient is overall well appearing and is suspected to have a transient course of illness.  Given History and Exam there does not appear to be an emergent cause of the symptoms such as small bowel obstruction, coronary syndrome, bowel ischemia, DKA, pancreatitis, appendicitis, other acute abdomen or other emergent problem.  Reassessment: After treatment, the patient is feeling much better, tolerating PO fluids, and shows  no signs of dehydration.   Disposition: Discharge home with prompt primary care physician follow up in the next 48 hours. Strict return precautions discussed.  Clinical Course as of Sep 25 1237  Wed Sep 26, 2019  1158 Creatinine(!): 1.46 [EB]    Clinical Course User Index [EB] Naaman Plummer, MD     ____________________________________________   FINAL CLINICAL IMPRESSION(S) / ED DIAGNOSES  Final diagnoses:  Enteritis  Duodenitis  Gastroesophageal reflux disease with esophagitis without hemorrhage     ED Discharge Orders    None       Note:  This document was prepared using Dragon voice recognition software and may include unintentional dictation errors.   Naaman Plummer, MD 09/26/19 1240

## 2019-10-01 ENCOUNTER — Telehealth: Payer: Self-pay | Admitting: General Practice

## 2019-10-01 NOTE — Telephone Encounter (Signed)
(  Hospital Follow up)Patient called and wanted to schedule a sooner appt than October 21 with Dr. Marius Ditch. Patient stated that she would follow up with CMA to see if she could get a sooner appointment. Patient expressed that her symptoms are affecting her everyday life.

## 2019-10-02 NOTE — Telephone Encounter (Signed)
Patient scheduled for [facility] follow up with Dr. Marius Ditch for Monday 9.20.21 @3PM . Patient is trans and prefers to go by [place]. FYI

## 2019-10-08 ENCOUNTER — Other Ambulatory Visit: Payer: Self-pay

## 2019-10-08 ENCOUNTER — Encounter: Payer: Self-pay | Admitting: Gastroenterology

## 2019-10-08 ENCOUNTER — Ambulatory Visit: Payer: Medicare HMO | Admitting: Gastroenterology

## 2019-10-08 VITALS — BP 140/94 | HR 99 | Temp 97.8°F | Ht 63.0 in | Wt 214.1 lb

## 2019-10-08 DIAGNOSIS — K2980 Duodenitis without bleeding: Secondary | ICD-10-CM | POA: Diagnosis not present

## 2019-10-08 DIAGNOSIS — R1030 Lower abdominal pain, unspecified: Secondary | ICD-10-CM

## 2019-10-08 DIAGNOSIS — R1013 Epigastric pain: Secondary | ICD-10-CM

## 2019-10-08 DIAGNOSIS — K529 Noninfective gastroenteritis and colitis, unspecified: Secondary | ICD-10-CM

## 2019-10-08 MED ORDER — ONDANSETRON 4 MG PO TBDP: 4.0000 mg | ORAL_TABLET | Freq: Three times a day (TID) | ORAL | 1 refills | Status: DC | PRN

## 2019-10-08 NOTE — Progress Notes (Signed)
Cephas Darby, MD [address]  [place]  [place], [place] [address]  Main: [phone]  Fax: [phone]    Gastroenterology Consultation  Referring Provider:     Letta Median, MD Primary Care Physician:  Letta Median, MD Primary Gastroenterologist:  Dr. Cephas Darby Reason for Consultation:     Dyspepsia, abdominal pain        HPI:   Destiny Wright is a 54 y.o. adult referred by Dr. Rebeca Alert, Durene Cal, MD  for consultation & management of approximately 3 weeks history of abdominal pain, lower abdominal as well as epigastric region associated with nausea, globus sensation, severe burning in throat.  Patient is unable to tolerate any food, reports severe regurgitation, he does report significant abdominal bloating.  He also reports 2 loose bowel movements this morning, which were nonbloody.  Due to worsening of the symptoms, he went to ER on 09/26/2019, underwent CT abdomen pelvis which revealed possible duodenitis and enteritis.  Labs were significant for mildly elevated serum lipase, mild AKI.  UA was negative.  Troponins were negative.  Patient has been chronically on Prevacid 15 mg daily since 2003 when he was told that he may have hiatal hernia.  He is recently switched to Pepcid since ER visit, taking it once a day.  He does vape CBD daily  NSAIDs: Used to take indomethacin which he has stopped due to AKI in 07/2019  Antiplts/Anticoagulants/Anti thrombotics: None  GI Procedures: Upper endoscopy 2003 Did not undergo colonoscopy yet He denies family history of GI malignancy  Past Medical History:  Diagnosis Date  . Cancer ([place])   . Endometriosis   . PVC (premature ventricular contraction)   . Reflux   . Thyroid disease   . Vitamin D deficiency     Past Surgical History:  Procedure Laterality Date  . ABDOMINAL HYSTERECTOMY    . BREAST SURGERY    . KNEE ARTHROSCOPY    . LAPAROSCOPY    . SINUSOTOMY    . TONSILLECTOMY AND ADENOIDECTOMY      . UPPER GI ENDOSCOPY      Current Outpatient Medications:  .  atorvastatin (LIPITOR) 20 MG tablet, Take 20 mg by mouth daily., Disp: , Rfl: 0 .  calcium-vitamin D (CALCIUM 500/D) 500-200 MG-UNIT per tablet, Take by mouth., Disp: , Rfl:  .  Cinnamon 500 MG capsule, Take by mouth., Disp: , Rfl:  .  docusate sodium (COLACE) 100 MG capsule, Take by mouth., Disp: , Rfl:  .  famotidine (PEPCID) 20 MG tablet, Take 1 tablet (20 mg total) by mouth 2 (two) times daily., Disp: 60 tablet, Rfl: 1 .  fenofibrate 54 MG tablet, , Disp: , Rfl:  .  gabapentin (NEURONTIN) 400 MG capsule, Take 1 capsule (400 mg total) by mouth 3 (three) times daily., Disp: 90 capsule, Rfl: 3 .  HYDROcodone-acetaminophen (NORCO/VICODIN) 5-325 MG tablet, Take by mouth., Disp: , Rfl:  .  NEEDLE, DISP, 18 G (B-D HYPODERMIC NEEDLE 18GX1.5") 18G X 1-1/2" MISC, Use q2 weeks as directed, Disp: , Rfl:  .  NON FORMULARY, Med Name: CBD ( vapes or tinctures as needed), Disp: , Rfl:  .  NONFORMULARY OR COMPOUNDED ITEM, [place] compound - Musculosketetal pain or inflammation: Baclofen 3%, Clonidine 0.2%, Diclofenac 4%, Gabapentin 6%, Lidocaine 3%., Disp: , Rfl:  .  ondansetron (ZOFRAN ODT) 4 MG disintegrating tablet, Take 1 tablet (4 mg total) by mouth every 8 (eight) hours as needed for nausea or vomiting., Disp: 20  tablet, Rfl: 1 .  sertraline (ZOLOFT) 100 MG tablet, Take 150 mg by mouth daily. , Disp: , Rfl:  .  SYNTHROID 150 MCG tablet, Take by mouth., Disp: , Rfl:  .  SYRINGE-NEEDLE, DISP, 3 ML (B-D 3CC LUER-LOK SYR 25GX5/8") 25G X 5/8" 3 ML MISC, USE TO INJECT TESTOSTERONE EVERY 2 WEEKS AS DIRECTED, Disp: , Rfl:  .  testosterone cypionate (DEPOTESTOSTERONE CYPIONATE) 200 MG/ML injection, SMARTSIG:0.8 Milliliter(s) IM Every 2 Weeks, Disp: , Rfl:  .  traMADol (ULTRAM) 50 MG tablet, Take 1 tablet (50 mg total) by mouth 3 (three) times daily., Disp: 90 tablet, Rfl: 2 .  VITAMIN D PO, 2,000 Units., Disp: , Rfl:   Current  Facility-Administered Medications:  .  triamcinolone acetonide (KENALOG) 10 MG/ML injection 10 mg, 10 mg, Other, Once, [place], Tamala Fothergill, DPM   History reviewed. No pertinent family history.   Social History   Tobacco Use  . Smoking status: Former Research scientist (life sciences)  . Smokeless tobacco: Never Used  . Tobacco comment: quit June 19 2004  Vaping Use  . Vaping Use: Every day  Substance Use Topics  . Alcohol use: Yes    Comment: rare  . Drug use: No    Allergies as of 10/08/2019 - Review Complete 10/08/2019  Allergen Reaction Noted  . Diclofenac Other (See Comments) 10/16/2013  . Other Other (See Comments) 01/23/2015    Review of Systems:    All systems reviewed and negative except where noted in HPI.   Physical Exam:  BP (!) 140/94 (BP Location: Left Arm, Patient Position: Sitting, Cuff Size: Normal)   Pulse 99   Temp 97.8 F (36.6 C) (Oral)   Ht 5\' 3"  (1.6 m)   Wt 214 lb 2 oz (97.1 kg)   BMI 37.93 kg/m  No LMP recorded. Patient has had a hysterectomy.  General:   Alert,  Well-developed, well-nourished, pleasant and cooperative in NAD Head:  Normocephalic and atraumatic. Eyes:  Sclera clear, no icterus.   Conjunctiva pink. Ears:  Normal auditory acuity. Nose:  No deformity, discharge, or lesions. Mouth:  No deformity or lesions,oropharynx pink & moist. Neck:  Supple; no masses or thyromegaly. Lungs:  Respirations even and unlabored.  Clear throughout to auscultation.   No wheezes, crackles, or rhonchi. No acute distress. Heart:  Regular rate and rhythm; no murmurs, clicks, rubs, or gallops. Abdomen:  Normal bowel sounds. Soft, diffusely distended, tympanic to percussion, diffusely tender, without masses, hepatosplenomegaly or hernias noted.  No guarding or rebound tenderness.   Rectal: Not performed Msk:  Symmetrical without gross deformities. Good, equal movement & strength bilaterally. Pulses:  Normal pulses noted. Extremities:  No clubbing or edema.  No cyanosis. Neurologic:   Alert and oriented x3;  grossly normal neurologically. Skin:  Intact without significant lesions or rashes. No jaundice. Psych:  Alert and cooperative. Normal mood and affect.  Imaging Studies: Reviewed  Assessment and Plan:   Destiny Wright is a 54 y.o. adult with obesity is seen in consultation for dyspepsia, lower abdominal pain, abnormal imaging revealing duodenitis and enteritis  Recommend EGD for further evaluation with gastric and duodenal biopsies Switch to Prevacid 30 mg daily before meals Trial of FD guard, samples provided Patient reports 2 episodes of vomiting, if it persists, recommend stool studies to rule out infection Discussed about healthy eating habits as well as to avoid vaping CBD as it can be associated with cannabis hyperemesis syndrome I have also discussed with patient about colonoscopy, he would like to defer at  this time  Follow up in 6 to 8 weeks   Cephas Darby, MD

## 2019-10-12 ENCOUNTER — Other Ambulatory Visit: Payer: Self-pay

## 2019-10-12 ENCOUNTER — Other Ambulatory Visit
Admission: RE | Admit: 2019-10-12 | Discharge: 2019-10-12 | Disposition: A | Discharge status: Home / Self Care | Payer: Medicare HMO | Source: Ambulatory Visit | Attending: Gastroenterology | Admitting: Gastroenterology

## 2019-10-12 DIAGNOSIS — Z01812 Encounter for preprocedural laboratory examination: Secondary | ICD-10-CM | POA: Insufficient documentation

## 2019-10-12 DIAGNOSIS — Z20822 Contact with and (suspected) exposure to covid-19: Secondary | ICD-10-CM | POA: Diagnosis not present

## 2019-10-13 LAB — SARS CORONAVIRUS 2 (TAT 6-24 HRS): SARS Coronavirus 2: NEGATIVE

## 2019-10-16 ENCOUNTER — Ambulatory Visit: Payer: Medicare HMO | Admitting: Certified Registered Nurse Anesthetist

## 2019-10-16 ENCOUNTER — Encounter: Payer: Self-pay | Admitting: Gastroenterology

## 2019-10-16 ENCOUNTER — Ambulatory Visit
Admission: RE | Admit: 2019-10-16 | Discharge: 2019-10-16 | Disposition: A | Discharge status: Home / Self Care | Payer: Medicare HMO | Attending: Gastroenterology | Admitting: Gastroenterology

## 2019-10-16 ENCOUNTER — Encounter
Admission: RE | Disposition: A | Discharge status: Home / Self Care | Payer: Self-pay | Source: Home / Self Care | Attending: Gastroenterology

## 2019-10-16 ENCOUNTER — Other Ambulatory Visit: Payer: Self-pay

## 2019-10-16 DIAGNOSIS — Z87891 Personal history of nicotine dependence: Secondary | ICD-10-CM | POA: Insufficient documentation

## 2019-10-16 DIAGNOSIS — K2950 Unspecified chronic gastritis without bleeding: Secondary | ICD-10-CM | POA: Diagnosis not present

## 2019-10-16 DIAGNOSIS — N189 Chronic kidney disease, unspecified: Secondary | ICD-10-CM | POA: Diagnosis not present

## 2019-10-16 DIAGNOSIS — Z79890 Hormone replacement therapy (postmenopausal): Secondary | ICD-10-CM | POA: Insufficient documentation

## 2019-10-16 DIAGNOSIS — K219 Gastro-esophageal reflux disease without esophagitis: Secondary | ICD-10-CM | POA: Diagnosis not present

## 2019-10-16 DIAGNOSIS — K449 Diaphragmatic hernia without obstruction or gangrene: Secondary | ICD-10-CM | POA: Diagnosis not present

## 2019-10-16 DIAGNOSIS — E079 Disorder of thyroid, unspecified: Secondary | ICD-10-CM | POA: Diagnosis not present

## 2019-10-16 DIAGNOSIS — R1013 Epigastric pain: Secondary | ICD-10-CM | POA: Diagnosis not present

## 2019-10-16 DIAGNOSIS — E559 Vitamin D deficiency, unspecified: Secondary | ICD-10-CM | POA: Insufficient documentation

## 2019-10-16 DIAGNOSIS — R1030 Lower abdominal pain, unspecified: Secondary | ICD-10-CM | POA: Insufficient documentation

## 2019-10-16 DIAGNOSIS — F329 Major depressive disorder, single episode, unspecified: Secondary | ICD-10-CM | POA: Insufficient documentation

## 2019-10-16 DIAGNOSIS — R1010 Upper abdominal pain, unspecified: Secondary | ICD-10-CM | POA: Insufficient documentation

## 2019-10-16 DIAGNOSIS — F419 Anxiety disorder, unspecified: Secondary | ICD-10-CM | POA: Insufficient documentation

## 2019-10-16 DIAGNOSIS — R933 Abnormal findings on diagnostic imaging of other parts of digestive tract: Secondary | ICD-10-CM | POA: Insufficient documentation

## 2019-10-16 DIAGNOSIS — B9681 Helicobacter pylori [H. pylori] as the cause of diseases classified elsewhere: Secondary | ICD-10-CM | POA: Insufficient documentation

## 2019-10-16 DIAGNOSIS — Z79899 Other long term (current) drug therapy: Secondary | ICD-10-CM | POA: Insufficient documentation

## 2019-10-16 HISTORY — DX: Cardiac arrhythmia, unspecified: I49.9

## 2019-10-16 HISTORY — DX: Chronic kidney disease, unspecified: N18.9

## 2019-10-16 HISTORY — DX: Anxiety disorder, unspecified: F41.9

## 2019-10-16 HISTORY — DX: Gastro-esophageal reflux disease without esophagitis: K21.9

## 2019-10-16 HISTORY — PX: ESOPHAGOGASTRODUODENOSCOPY (EGD) WITH PROPOFOL: SHX5813

## 2019-10-16 HISTORY — DX: Cardiac murmur, unspecified: R01.1

## 2019-10-16 HISTORY — DX: Depression, unspecified: F32.A

## 2019-10-16 SURGERY — ESOPHAGOGASTRODUODENOSCOPY (EGD) WITH PROPOFOL
Anesthesia: General

## 2019-10-16 MED ORDER — LIDOCAINE HCL (PF) 2 % IJ SOLN
INTRAMUSCULAR | Status: AC
  Filled 2019-10-16: qty 5

## 2019-10-16 MED ORDER — PROPOFOL 10 MG/ML IV BOLUS
INTRAVENOUS | Status: DC | PRN
  Administered 2019-10-16: 40 mg via INTRAVENOUS
  Administered 2019-10-16: 20 mg via INTRAVENOUS
  Administered 2019-10-16: 60 mg via INTRAVENOUS

## 2019-10-16 MED ORDER — SODIUM CHLORIDE 0.9 % IV SOLN: INTRAVENOUS | Status: DC

## 2019-10-16 MED ORDER — PROPOFOL 500 MG/50ML IV EMUL
INTRAVENOUS | Status: DC | PRN
  Administered 2019-10-16: 200 ug/kg/min via INTRAVENOUS

## 2019-10-16 MED ORDER — PROPOFOL 500 MG/50ML IV EMUL
INTRAVENOUS | Status: AC
  Filled 2019-10-16: qty 50

## 2019-10-16 MED ORDER — LIDOCAINE HCL (CARDIAC) PF 100 MG/5ML IV SOSY
PREFILLED_SYRINGE | INTRAVENOUS | Status: DC | PRN
  Administered 2019-10-16: 100 mg via INTRAVENOUS

## 2019-10-16 NOTE — Transfer of Care (Signed)
Immediate Anesthesia Transfer of Care Note  Patient: Destiny Wright  Procedure(s) Performed: ESOPHAGOGASTRODUODENOSCOPY (EGD) WITH PROPOFOL (N/A )  Patient Location: PACU  Anesthesia Type:General  Level of Consciousness: awake, alert  and oriented  Airway & Oxygen Therapy: Patient Spontanous Breathing and Patient connected to face mask oxygen  Post-op Assessment: Report given to RN and Post -op Vital signs reviewed and stable  Post vital signs: Reviewed and stable  Last Vitals:  Vitals Value Taken Time  BP 148/97 10/16/19 0944  Temp    Pulse 92 10/16/19 0944  Resp 13 10/16/19 0944  SpO2 100 % 10/16/19 0944  Vitals shown include unvalidated device data.  Last Pain:  Vitals:   10/16/19 0903  TempSrc: Tympanic  PainSc: 8          Complications: No complications documented.

## 2019-10-16 NOTE — H&P (Addendum)
Destiny Darby, MD [address]  [place]  [place], [place] [address]  Main: [phone]  Fax: [phone] Pager: [phone]  Primary Care Physician:  Letta Median, MD Primary Gastroenterologist:  Dr. Cephas Wright  Pre-Procedure History & Physical: HPI:  Destiny Wright is a 54 y.o. adult is here for an endoscopy.   Past Medical History:  Diagnosis Date  . Anxiety   . Cancer ([place])   . Chronic kidney disease   . Depression   . Dysrhythmia    PVC  . Endometriosis   . GERD (gastroesophageal reflux disease)   . Heart murmur   . PVC (premature ventricular contraction)   . Reflux   . Thyroid disease   . Vitamin D deficiency     Past Surgical History:  Procedure Laterality Date  . ABDOMINAL HYSTERECTOMY    . BREAST SURGERY    . KNEE ARTHROSCOPY    . LAPAROSCOPY    . SINUSOTOMY    . TONSILLECTOMY AND ADENOIDECTOMY    . UPPER GI ENDOSCOPY      Prior to Admission medications   Medication Sig Start Date End Date Taking? Authorizing Provider  atorvastatin (LIPITOR) 20 MG tablet Take 20 mg by mouth daily. 06/19/14   [provider]  calcium-vitamin D (CALCIUM 500/D) 500-200 MG-UNIT per tablet Take by mouth.    [provider]  Cinnamon 500 MG capsule Take by mouth. 08/13/19   [provider]  docusate sodium (COLACE) 100 MG capsule Take by mouth. 08/13/19   [provider]  famotidine (PEPCID) 20 MG tablet Take 1 tablet (20 mg total) by mouth 2 (two) times daily. 09/26/19 09/25/20  Naaman Plummer, MD  fenofibrate 54 MG tablet  10/01/19   [provider]  gabapentin (NEURONTIN) 400 MG capsule Take 1 capsule (400 mg total) by mouth 3 (three) times daily. 06/20/14   Wallene Huh, DPM  HYDROcodone-acetaminophen (NORCO/VICODIN) 5-325 MG tablet Take by mouth.    [provider]  NEEDLE, DISP, 18 G (B-D HYPODERMIC NEEDLE 18GX1.5") 18G X 1-1/2" MISC Use q2 weeks as directed 07/17/19   [provider]  NON  FORMULARY Med Name: CBD ( vapes or tinctures as needed)    [provider]  NONFORMULARY OR COMPOUNDED [place] compound - Musculosketetal pain or inflammation: Baclofen 3%, Clonidine 0.2%, Diclofenac 4%, Gabapentin 6%, Lidocaine 3%.    Wallene Huh, DPM  ondansetron (ZOFRAN ODT) 4 MG disintegrating tablet Take 1 tablet (4 mg total) by mouth every 8 (eight) hours as needed for nausea or vomiting. 10/08/19   Takirah Binford, Tally Due, MD  sertraline (ZOLOFT) 100 MG tablet Take 150 mg by mouth daily.     [provider]  SYNTHROID 150 MCG tablet Take by mouth. 08/21/19   [provider]  SYRINGE-NEEDLE, DISP, 3 ML (B-D 3CC LUER-LOK SYR 25GX5/8") 25G X 5/8" 3 ML MISC USE TO INJECT TESTOSTERONE EVERY 2 WEEKS AS DIRECTED 07/17/19   [provider]  testosterone cypionate (DEPOTESTOSTERONE CYPIONATE) 200 MG/ML injection SMARTSIG:0.8 Milliliter(s) IM Every 2 Weeks 08/22/19   [provider]  traMADol (ULTRAM) 50 MG tablet Take 1 tablet (50 mg total) by mouth 3 (three) times daily. 02/01/14   Wallene Huh, DPM  VITAMIN D PO 2,000 Units. 08/13/19   [provider]    Allergies as of 10/08/2019 - Review Complete 10/08/2019  Allergen Reaction Noted  . Diclofenac Other (See Comments) 10/16/2013  . Other Other (See Comments) 01/23/2015  History reviewed. No pertinent family history.  Social History   Socioeconomic History  . Marital status: Single    Spouse name: Not on file  . Number of children: Not on file  . Years of education: Not on file  . Highest education level: Not on file  Occupational History  . Not on file  Tobacco Use  . Smoking status: Former Smoker    Quit date: 2006    Years since quitting: 15.7  . Smokeless tobacco: Never Used  . Tobacco comment: quit June 19 2004  Vaping Use  . Vaping Use: Every day  . Devices: cbd  Substance and Sexual Activity  . Alcohol use: Yes    Comment: rare  . Drug use: No  . Sexual  activity: Not on file  Other Topics Concern  . Not on file  Social History Narrative  . Not on file   Social Determinants of Health   Financial Resource Strain:   . Difficulty of Paying Living Expenses: Not on file  Food Insecurity:   . Worried About Charity fundraiser in the Last Year: Not on file  . Ran Out of Food in the Last Year: Not on file  Transportation Needs:   . Lack of Transportation (Medical): Not on file  . Lack of Transportation (Non-Medical): Not on file  Physical Activity:   . Days of Exercise per Week: Not on file  . Minutes of Exercise per Session: Not on file  Stress:   . Feeling of Stress : Not on file  Social Connections:   . Frequency of Communication with Friends and Family: Not on file  . Frequency of Social Gatherings with Friends and Family: Not on file  . Attends Religious Services: Not on file  . Active Member of Clubs or Organizations: Not on file  . Attends Archivist Meetings: Not on file  . Marital Status: Not on file  Intimate Partner Violence:   . Fear of Current or Ex-Partner: Not on file  . Emotionally Abused: Not on file  . Physically Abused: Not on file  . Sexually Abused: Not on file    Review of Systems: See HPI, otherwise negative ROS  Physical Exam: BP (!) 130/101   Pulse 97   Temp (!) 97.2 F (36.2 C) (Tympanic)   Resp 18   Ht 5\' 3"  (1.6 m)   Wt 93 kg   SpO2 97%   BMI 36.31 kg/m  General:   Alert,  pleasant and cooperative in NAD Head:  Normocephalic and atraumatic. Neck:  Supple; no masses or thyromegaly. Lungs:  Clear throughout to auscultation.    Heart:  Regular rate and rhythm. Abdomen:  Soft, nontender and nondistended. Normal bowel sounds, without guarding, and without rebound.   Neurologic:  Alert and  oriented x4;  grossly normal neurologically.  Impression/Plan: [name] is here for an endoscopy to be performed for dyspepsia, abnormal imaging revealing duodenitis and enteritis  Risks,  benefits, limitations, and alternatives regarding  endoscopy have been reviewed with the patient.  Questions have been answered.  All parties agreeable.   Sherri Sear, MD  10/16/2019, 9:20 AM

## 2019-10-16 NOTE — Anesthesia Preprocedure Evaluation (Addendum)
Anesthesia Evaluation  Patient identified by MRN, date of birth, ID band Patient awake    Reviewed: Allergy & Precautions, H&P , NPO status , Patient's Chart, lab work & pertinent test results, reviewed documented beta blocker date and time   Airway Mallampati: II   Neck ROM: full    Dental  (+) Poor Dentition   Pulmonary neg pulmonary ROS, former smoker,    Pulmonary exam normal        Cardiovascular Exercise Tolerance: Good Normal cardiovascular exam+ dysrhythmias + Valvular Problems/Murmurs  Rhythm:regular Rate:Normal     Neuro/Psych PSYCHIATRIC DISORDERS Anxiety Depression negative neurological ROS     GI/Hepatic Neg liver ROS, GERD  Medicated,  Endo/Other  negative endocrine ROS  Renal/GU Renal disease  negative genitourinary   Musculoskeletal   Abdominal   Peds  Hematology negative hematology ROS (+)   Anesthesia Other Findings Past Medical History: No date: Cancer ([place]) No date: Endometriosis No date: PVC (premature ventricular contraction) No date: Reflux No date: Thyroid disease No date: Vitamin D deficiency Past Surgical History: No date: ABDOMINAL HYSTERECTOMY No date: BREAST SURGERY No date: KNEE ARTHROSCOPY No date: LAPAROSCOPY No date: SINUSOTOMY No date: TONSILLECTOMY AND ADENOIDECTOMY No date: UPPER GI ENDOSCOPY   Reproductive/Obstetrics negative OB ROS                            Anesthesia Physical Anesthesia Plan  ASA: III  Anesthesia Plan: General   Post-op Pain Management:    Induction:   PONV Risk Score and Plan:   Airway Management Planned:   Additional Equipment:   Intra-op Plan:   Post-operative Plan:   Informed Consent: I have reviewed the patients History and Physical, chart, labs and discussed the procedure including the risks, benefits and alternatives for the proposed anesthesia with the patient or authorized representative who has  indicated his/her understanding and acceptance.     Dental Advisory Given  Plan Discussed with: CRNA  Anesthesia Plan Comments:         Anesthesia Quick Evaluation

## 2019-10-16 NOTE — Op Note (Signed)
[facility] Gastroenterology Patient Name: Destiny Wright Procedure Date: 10/16/2019 9:24 AM MRN: [id number] Account #: [id number] Date of Birth: May 28, 1965 Admit Type: Outpatient Age: 54 Room: [facility] ENDO ROOM 1 Gender: Female Note Status: Finalized Procedure:             Upper GI endoscopy Indications:           Epigastric abdominal pain, Lower abdominal pain, Upper                         abdominal pain, Dyspepsia, Abnormal CT of the GI tract Providers:             Lin Landsman MD, MD Referring MD:          Baxter Kail. Rebeca Alert MD, MD (Referring MD) Medicines:             Monitored Anesthesia Care Complications:         No immediate complications. Estimated blood loss: None. Procedure:             Pre-Anesthesia Assessment:                        - Prior to the procedure, a History and Physical was                         performed, and patient medications and allergies were                         reviewed. The patient is competent. The risks and                         benefits of the procedure and the sedation options and                         risks were discussed with the patient. All questions                         were answered and informed consent was obtained.                         Patient identification and proposed procedure were                         verified by the physician, the nurse, the                         anesthesiologist, the anesthetist and the technician                         in the pre-procedure area in the procedure room in the                         endoscopy suite. Mental Status Examination: alert and                         oriented. Airway Examination: normal oropharyngeal                         airway and neck mobility. Respiratory Examination:  clear to auscultation. CV Examination: normal.                         Prophylactic Antibiotics: The patient does not require                          prophylactic antibiotics. Prior Anticoagulants: The                         patient has taken no previous anticoagulant or                         antiplatelet agents. ASA Grade Assessment: III - A                         patient with severe systemic disease. After reviewing                         the risks and benefits, the patient was deemed in                         satisfactory condition to undergo the procedure. The                         anesthesia plan was to use monitored anesthesia care                         (MAC). Immediately prior to administration of                         medications, the patient was re-assessed for adequacy                         to receive sedatives. The heart rate, respiratory                         rate, oxygen saturations, blood pressure, adequacy of                         pulmonary ventilation, and response to care were                         monitored throughout the procedure. The physical                         status of the patient was re-assessed after the                         procedure.                        After obtaining informed consent, the endoscope was                         passed under direct vision. Throughout the procedure,                         the patient's blood pressure, pulse, and oxygen  saturations were monitored continuously. The Endoscope                         was introduced through the mouth, and advanced to the                         second part of duodenum. The upper GI endoscopy was                         accomplished without difficulty. The patient tolerated                         the procedure well. Findings:      Diffuse mildly erythematous mucosa without active bleeding and with no       stigmata of bleeding was found in the duodenal bulb and in the second       portion of the duodenum. Biopsies were taken with a cold forceps for       histology.      Diffuse moderately  erythematous mucosa without bleeding was found in the       entire examined stomach. Biopsies were taken with a cold forceps for       Helicobacter pylori testing.      A small hiatal hernia was present.      The cardia and gastric fundus were normal on retroflexion.      Esophagogastric landmarks were identified: the gastroesophageal junction       was found at 33 cm from the incisors.      The gastroesophageal junction and examined esophagus were normal. Impression:            - Erythematous duodenopathy. Biopsied.                        - Erythematous mucosa in the stomach. Biopsied.                        - Small hiatal hernia.                        - Esophagogastric landmarks identified.                        - Normal gastroesophageal junction and esophagus. Recommendation:        - Discharge patient to home (with escort).                        - Resume previous diet today.                        - Continue present medications.                        - Await pathology results. Procedure Code(s):     --- Professional ---                        [phone], Esophagogastroduodenoscopy, flexible,                         transoral; with biopsy, single or multiple Diagnosis Code(s):     --- Professional ---  K31.89, Other diseases of stomach and duodenum                        K44.9, Diaphragmatic hernia without obstruction or                         gangrene                        R10.13, Epigastric pain                        R10.30, Lower abdominal pain, unspecified                        R10.10, Upper abdominal pain, unspecified                        R93.3, Abnormal findings on diagnostic imaging of                         other parts of digestive tract CPT copyright 2019 American Medical Association. All rights reserved. The codes documented in this report are preliminary and upon coder review may  be revised to meet current compliance requirements. Dr. Ulyess Mort Lin Landsman MD, MD 10/16/2019 9:43:40 AM This report has been signed electronically. Number of Addenda: 0 Note Initiated On: 10/16/2019 9:24 AM Estimated Blood Loss:  Estimated blood loss: none.      [facility]

## 2019-10-17 ENCOUNTER — Encounter: Payer: Self-pay | Admitting: Gastroenterology

## 2019-10-17 ENCOUNTER — Telehealth: Payer: Self-pay

## 2019-10-17 DIAGNOSIS — A048 Other specified bacterial intestinal infections: Secondary | ICD-10-CM

## 2019-10-17 DIAGNOSIS — Q400 Congenital hypertrophic pyloric stenosis: Secondary | ICD-10-CM

## 2019-10-17 LAB — SURGICAL PATHOLOGY

## 2019-10-17 MED ORDER — OMEPRAZOLE 40 MG PO CPDR: 40.0000 mg | DELAYED_RELEASE_CAPSULE | Freq: Two times a day (BID) | ORAL | 0 refills | Status: DC

## 2019-10-17 MED ORDER — AMOXICILLIN 500 MG PO TABS: 1000.0000 mg | ORAL_TABLET | Freq: Two times a day (BID) | ORAL | 0 refills | Status: AC

## 2019-10-17 MED ORDER — CLARITHROMYCIN 500 MG PO TABS: 500.0000 mg | ORAL_TABLET | Freq: Two times a day (BID) | ORAL | 0 refills | Status: AC

## 2019-10-17 NOTE — Telephone Encounter (Signed)
-----   Message from Lin Landsman, MD sent at 10/17/2019  2:53 PM EDT ----- Plz send him the prescription for triple therapy to treat H Pylori for 14days  Omeprazole 40mg  BID Clarithromycin 500mg  BID Amoxicillin 1gm BID  Order H Pylori breath test in 4weeks after completing medication to confirm eradication. She should be off prilosec and H2 blocker atleast for 2weeks before the test  Thanks RV

## 2019-10-17 NOTE — Telephone Encounter (Signed)
Patient verbalized understanding of results. Sent medication to the pharmacy and order lab work

## 2019-10-21 NOTE — Anesthesia Postprocedure Evaluation (Signed)
Anesthesia Post Note  Patient: Brunilda Eble  Procedure(s) Performed: ESOPHAGOGASTRODUODENOSCOPY (EGD) WITH PROPOFOL (N/A )  Patient location during evaluation: PACU Anesthesia Type: General Level of consciousness: awake and alert Pain management: pain level controlled Vital Signs Assessment: post-procedure vital signs reviewed and stable Respiratory status: spontaneous breathing, nonlabored ventilation, respiratory function stable and patient connected to nasal cannula oxygen Cardiovascular status: blood pressure returned to baseline and stable Postop Assessment: no apparent nausea or vomiting Anesthetic complications: no   No complications documented.   Last Vitals:  Vitals:   10/16/19 0954 10/16/19 1004  BP: (!) 149/95 (!) 150/96  Pulse: 88 86  Resp: 16 13  Temp:    SpO2: 98% 99%    Last Pain:  Vitals:   10/17/19 0750  TempSrc:   PainSc: 0-No pain                 Molli Barrows

## 2019-11-12 ENCOUNTER — Ambulatory Visit (INDEPENDENT_AMBULATORY_CARE_PROVIDER_SITE_OTHER): Payer: Disability Insurance | Admitting: Gastroenterology

## 2019-11-12 ENCOUNTER — Encounter: Payer: Self-pay | Admitting: Internal Medicine

## 2019-11-12 ENCOUNTER — Other Ambulatory Visit: Payer: Self-pay

## 2019-11-12 ENCOUNTER — Encounter: Payer: Self-pay | Admitting: Gastroenterology

## 2019-11-12 VITALS — BP 146/99 | HR 78 | Temp 98.3°F | Ht 63.0 in | Wt 205.1 lb

## 2019-11-12 DIAGNOSIS — A048 Other specified bacterial intestinal infections: Secondary | ICD-10-CM | POA: Diagnosis not present

## 2019-11-12 DIAGNOSIS — K219 Gastro-esophageal reflux disease without esophagitis: Secondary | ICD-10-CM

## 2019-11-12 DIAGNOSIS — R1013 Epigastric pain: Secondary | ICD-10-CM | POA: Diagnosis not present

## 2019-11-12 DIAGNOSIS — Z8719 Personal history of other diseases of the digestive system: Secondary | ICD-10-CM | POA: Diagnosis not present

## 2019-11-12 NOTE — Patient Instructions (Signed)
High-Fiber Diet Fiber, also called dietary fiber, is a type of carbohydrate that is found in fruits, vegetables, whole grains, and beans. A high-fiber diet can have many health benefits. Your health care provider may recommend a high-fiber diet to help:  Prevent constipation. Fiber can make your bowel movements more regular.  Lower your cholesterol.  Relieve the following conditions: ? Swelling of veins in the anus (hemorrhoids). ? Swelling and irritation (inflammation) of specific areas of the digestive tract (uncomplicated diverticulosis). ? A problem of the large intestine (colon) that sometimes causes pain and diarrhea (irritable bowel syndrome, IBS).  Prevent overeating as part of a weight-loss plan.  Prevent heart disease, type 2 diabetes, and certain cancers. What is my plan? The recommended daily fiber intake in grams (g) includes:  38 g for men age 53 or younger.  30 g for men over age 53.  25 g for women age 53 or younger.  21 g for women over age 53. You can get the recommended daily intake of dietary fiber by:  Eating a variety of fruits, vegetables, grains, and beans.  Taking a fiber supplement, if it is not possible to get enough fiber through your diet. What do I need to know about a high-fiber diet?  It is better to get fiber through food sources rather than from fiber supplements. There is not a lot of research about how effective supplements are.  Always check the fiber content on the nutrition facts label of any prepackaged food. Look for foods that contain 5 g of fiber or more per serving.  Talk with a diet and nutrition specialist (dietitian) if you have questions about specific foods that are recommended or not recommended for your medical condition, especially if those foods are not listed below.  Gradually increase how much fiber you consume. If you increase your intake of dietary fiber too quickly, you may have bloating, cramping, or gas.  Drink plenty  of water. Water helps you to digest fiber. What are tips for following this plan?  Eat a wide variety of high-fiber foods.  Make sure that half of the grains that you eat each day are whole grains.  Eat breads and cereals that are made with whole-grain flour instead of refined flour or white flour.  Eat brown rice, bulgur wheat, or millet instead of white rice.  Start the day with a breakfast that is high in fiber, such as a cereal that contains 5 g of fiber or more per serving.  Use beans in place of meat in soups, salads, and pasta dishes.  Eat high-fiber snacks, such as berries, raw vegetables, nuts, and popcorn.  Choose whole fruits and vegetables instead of processed forms like juice or sauce. What foods can I eat?  Fruits Berries. Pears. Apples. Oranges. Avocado. Prunes and raisins. Dried figs. Vegetables Sweet potatoes. Spinach. Kale. Artichokes. Cabbage. Broccoli. Cauliflower. Green peas. Carrots. Squash. Grains Whole-grain breads. Multigrain cereal. Oats and oatmeal. Brown rice. Barley. Bulgur wheat. Millet. Quinoa. Bran muffins. Popcorn. Rye wafer crackers. Meats and other proteins Navy, kidney, and pinto beans. Soybeans. Split peas. Lentils. Nuts and seeds. Dairy Fiber-fortified yogurt. Beverages Fiber-fortified soy milk. Fiber-fortified orange juice. Other foods Fiber bars. The items listed above may not be a complete list of recommended foods and beverages. Contact a dietitian for more options. What foods are not recommended? Fruits Fruit juice. Cooked, strained fruit. Vegetables Fried potatoes. Canned vegetables. Well-cooked vegetables. Grains White bread. Pasta made with refined flour. White rice. Meats and other   proteins Fatty cuts of meat. Fried chicken or fried fish. Dairy Milk. Yogurt. Cream cheese. Sour cream. Fats and oils Butters. Beverages Soft drinks. Other foods Cakes and pastries. The items listed above may not be a complete list of foods  and beverages to avoid. Contact a dietitian for more information. Summary  Fiber is a type of carbohydrate. It is found in fruits, vegetables, whole grains, and beans.  There are many health benefits of eating a high-fiber diet, such as preventing constipation, lowering blood cholesterol, helping with weight loss, and reducing your risk of heart disease, diabetes, and certain cancers.  Gradually increase your intake of fiber. Increasing too fast can result in cramping, bloating, and gas. Drink plenty of water while you increase your fiber.  The best sources of fiber include whole fruits and vegetables, whole grains, nuts, seeds, and beans. This information is not intended to replace advice given to you by your health care provider. Make sure you discuss any questions you have with your health care provider. Document Revised: 11/08/2016 Document Reviewed: 11/08/2016 Elsevier Patient Education  [place]. Gastroesophageal Reflux Disease, Adult Gastroesophageal reflux (GER) happens when acid from the stomach flows up into the tube that connects the mouth and the stomach (esophagus). Normally, food travels down the esophagus and stays in the stomach to be digested. With GER, food and stomach acid sometimes move back up into the esophagus. You may have a disease called gastroesophageal reflux disease (GERD) if the reflux:  Happens often.  Causes frequent or very bad symptoms.  Causes problems such as damage to the esophagus. When this happens, the esophagus becomes sore and swollen (inflamed). Over time, GERD can make small holes (ulcers) in the lining of the esophagus. What are the causes? This condition is caused by a problem with the muscle between the esophagus and the stomach. When this muscle is weak or not normal, it does not close properly to keep food and acid from coming back up from the stomach. The muscle can be weak because of:  Tobacco use.  Pregnancy.  Having a certain  type of hernia (hiatal hernia).  Alcohol use.  Certain foods and drinks, such as coffee, chocolate, onions, and peppermint. What increases the risk? You are more likely to develop this condition if you:  Are overweight.  Have a disease that affects your connective tissue.  Use NSAID medicines. What are the signs or symptoms? Symptoms of this condition include:  Heartburn.  Difficult or painful swallowing.  The feeling of having a lump in the throat.  A bitter taste in the mouth.  Bad breath.  Having a lot of saliva.  Having an upset or bloated stomach.  Belching.  Chest pain. Different conditions can cause chest pain. Make sure you see your doctor if you have chest pain.  Shortness of breath or noisy breathing (wheezing).  Ongoing (chronic) cough or a cough at night.  Wearing away of the surface of teeth (tooth enamel).  Weight loss. How is this treated? Treatment will depend on how bad your symptoms are. Your doctor may suggest:  Changes to your diet.  Medicine.  Surgery. Follow these instructions at home: Eating and drinking   Follow a diet as told by your doctor. You may need to avoid foods and drinks such as: ? Coffee and tea (with or without caffeine). ? Drinks that contain alcohol. ? Energy drinks and sports drinks. ? Bubbly (carbonated) drinks or sodas. ? Chocolate and cocoa. ? Peppermint and mint flavorings. ?  Garlic and onions. ? Horseradish. ? Spicy and acidic foods. These include peppers, chili powder, curry powder, vinegar, hot sauces, and BBQ sauce. ? Citrus fruit juices and citrus fruits, such as oranges, lemons, and limes. ? Tomato-based foods. These include red sauce, chili, salsa, and pizza with red sauce. ? Fried and fatty foods. These include donuts, french fries, potato chips, and high-fat dressings. ? High-fat meats. These include hot dogs, rib eye steak, sausage, ham, and bacon. ? High-fat dairy items, such as whole milk,  butter, and cream cheese.  Eat small meals often. Avoid eating large meals.  Avoid drinking large amounts of liquid with your meals.  Avoid eating meals during the 2-3 hours before bedtime.  Avoid lying down right after you eat.  Do not exercise right after you eat. Lifestyle   Do not use any products that contain nicotine or tobacco. These include cigarettes, e-cigarettes, and chewing tobacco. If you need help quitting, ask your doctor.  Try to lower your stress. If you need help doing this, ask your doctor.  If you are overweight, lose an amount of weight that is healthy for you. Ask your doctor about a safe weight loss goal. General instructions  Pay attention to any changes in your symptoms.  Take over-the-counter and prescription medicines only as told by your doctor. Do not take aspirin, ibuprofen, or other NSAIDs unless your doctor says it is okay.  Wear loose clothes. Do not wear anything tight around your waist.  Raise (elevate) the head of your bed about 6 inches (15 cm).  Avoid bending over if this makes your symptoms worse.  Keep all follow-up visits as told by your doctor. This is important. Contact a doctor if:  You have new symptoms.  You lose weight and you do not know why.  You have trouble swallowing or it hurts to swallow.  You have wheezing or a cough that keeps happening.  Your symptoms do not get better with treatment.  You have a hoarse voice. Get help right away if:  You have pain in your arms, neck, jaw, teeth, or back.  You feel sweaty, dizzy, or light-headed.  You have chest pain or shortness of breath.  You throw up (vomit) and your throw-up looks like blood or coffee grounds.  You pass out (faint).  Your poop (stool) is bloody or black.  You cannot swallow, drink, or eat. Summary  If a person has gastroesophageal reflux disease (GERD), food and stomach acid move back up into the esophagus and cause symptoms or problems such as  damage to the esophagus.  Treatment will depend on how bad your symptoms are.  Follow a diet as told by your doctor.  Take all medicines only as told by your doctor. This information is not intended to replace advice given to you by your health care provider. Make sure you discuss any questions you have with your health care provider. Document Revised: 07/13/2017 Document Reviewed: 07/13/2017 Elsevier Patient Education  [place].

## 2019-11-12 NOTE — Progress Notes (Signed)
Cephas Darby, MD [address]  [place]  [place], [place] [address]  Main: [phone]  Fax: [phone]    Gastroenterology Consultation  Referring Provider:     Letta Median, MD Primary Care Physician:  Letta Median, MD Primary Gastroenterologist:  Dr. Cephas Darby Reason for Consultation:   H. pylori gastritis       HPI:   Destiny Wright is a 54 y.o. adult referred by Dr. Rebeca Alert, Durene Cal, MD  for consultation & management of approximately 3 weeks history of abdominal pain, lower abdominal as well as epigastric region associated with nausea, globus sensation, severe burning in throat.  Patient is unable to tolerate any food, reports severe regurgitation, he does report significant abdominal bloating.  He also reports 2 loose bowel movements this morning, which were nonbloody.  Due to worsening of the symptoms, he went to ER on 09/26/2019, underwent CT abdomen pelvis which revealed possible duodenitis and enteritis.  Labs were significant for mildly elevated serum lipase, mild AKI.  UA was negative.  Troponins were negative.  Patient has been chronically on Prevacid 15 mg daily since 2003 when he was told that he may have hiatal hernia.  He is recently switched to Pepcid since ER visit, taking it once a day.  He does vape CBD daily  Follow-up visit 11/12/2019 Patient underwent upper endoscopy, confirmed H. pylori gastritis causing his symptoms.  Therefore, treated with triple therapy.  Patient finished antibiotics of 2 weeks course, tolerated well.  He reports significant improvement in his symptoms.  However, last week, he did experience mild abdominal discomfort associated with nausea as well as 1-2 loose stools which were nonbloody.  Patient had several questions regarding the infection its contractility, upper endoscopy findings, reflux as well.  He is currently taking Prevacid twice daily for reflux.  He does report globus sensation, upper endoscopy was  however normal.  He switched from carbonated beverages to sweet tea.  He has been avoiding red meat due to dyspepsia symptoms.  He lost about 20 pounds since the onset of symptoms.  His weight has been stabilized now.  NSAIDs: Used to take indomethacin which he has stopped due to AKI in 07/2019  Antiplts/Anticoagulants/Anti thrombotics: None  GI Procedures: Upper endoscopy 2003 Upper endoscopy 10/08/2019 - Erythematous duodenopathy. Biopsied. - Erythematous mucosa in the stomach. Biopsied. - Small hiatal hernia. - Esophagogastric landmarks identified. - Normal gastroesophageal junction and esophagus.  DIAGNOSIS:  A. DUODENUM, RANDOM; COLD BIOPSY:  - ENTERIC MUCOSA WITH PRESERVED VILLOUS ARCHITECTURE AND NO SIGNIFICANT  HISTOPATHOLOGIC CHANGE.  - NEGATIVE FOR FEATURES OF CELIAC, DYSPLASIA, AND MALIGNANCY.   B. STOMACH, RANDOM; COLD BIOPSY:  - GASTRIC ANTRAL AND OXYNTIC MUCOSA WITH CHRONIC ACTIVE GASTRITIS WITH  HELICOBACTER PYLORI TYPE ORGANISMS.  - NEGATIVE FOR DYSPLASIA AND MALIGNANCY.    Did not undergo colonoscopy yet He denies family history of GI malignancy  Past Medical History:  Diagnosis Date   Anxiety    Cancer ([place])    Chronic kidney disease    Depression    Dysrhythmia    PVC   Endometriosis    GERD (gastroesophageal reflux disease)    Heart murmur    PVC (premature ventricular contraction)    Reflux    Thyroid disease    Vitamin D deficiency     Past Surgical History:  Procedure Laterality Date   ABDOMINAL HYSTERECTOMY     BREAST SURGERY     ESOPHAGOGASTRODUODENOSCOPY (EGD) WITH PROPOFOL N/A 10/16/2019  Procedure: ESOPHAGOGASTRODUODENOSCOPY (EGD) WITH PROPOFOL;  Surgeon: Lin Landsman, MD;  Location: [facility] ENDOSCOPY;  Service: Gastroenterology;  Laterality: N/A;   KNEE ARTHROSCOPY     LAPAROSCOPY     SINUSOTOMY     TONSILLECTOMY AND ADENOIDECTOMY     UPPER GI ENDOSCOPY      Current Outpatient Medications:    atorvastatin  (LIPITOR) 20 MG tablet, Take 20 mg by mouth daily., Disp: , Rfl: 0   calcium-vitamin D (CALCIUM 500/D) 500-200 MG-UNIT per tablet, Take by mouth., Disp: , Rfl:    Cinnamon 500 MG capsule, Take by mouth., Disp: , Rfl:    Famotidine (PEPCID PO), Take 30 mg by mouth once., Disp: , Rfl:    fenofibrate 54 MG tablet, , Disp: , Rfl:    gabapentin (NEURONTIN) 300 MG capsule, Take 600 mg by mouth 2 (two) times daily., Disp: , Rfl:    HYDROcodone-acetaminophen (NORCO/VICODIN) 5-325 MG tablet, Take by mouth., Disp: , Rfl:    NEEDLE, DISP, 18 G (B-D HYPODERMIC NEEDLE 18GX1.5") 18G X 1-1/2" MISC, Use q2 weeks as directed, Disp: , Rfl:    NON FORMULARY, Med Name: CBD ( vapes or tinctures as needed), Disp: , Rfl:    NONFORMULARY OR COMPOUNDED ITEM, [place] compound - Musculosketetal pain or inflammation: Baclofen 3%, Clonidine 0.2%, Diclofenac 4%, Gabapentin 6%, Lidocaine 3%., Disp: , Rfl:    ondansetron (ZOFRAN ODT) 4 MG disintegrating tablet, Take 1 tablet (4 mg total) by mouth every 8 (eight) hours as needed for nausea or vomiting., Disp: 20 tablet, Rfl: 1   SYNTHROID 150 MCG tablet, Take by mouth., Disp: , Rfl:    SYRINGE-NEEDLE, DISP, 3 ML (B-D 3CC LUER-LOK SYR 25GX5/8") 25G X 5/8" 3 ML MISC, USE TO INJECT TESTOSTERONE EVERY 2 WEEKS AS DIRECTED, Disp: , Rfl:    testosterone cypionate (DEPOTESTOSTERONE CYPIONATE) 200 MG/ML injection, SMARTSIG:0.8 Milliliter(s) IM Every 2 Weeks, Disp: , Rfl:    VITAMIN D PO, 2,000 Units., Disp: , Rfl:    docusate sodium (COLACE) 100 MG capsule, Take by mouth. (Patient not taking: Reported on 11/12/2019), Disp: , Rfl:   Current Facility-Administered Medications:    triamcinolone acetonide (KENALOG) 10 MG/ML injection 10 mg, 10 mg, Other, Once, [place], Tamala Fothergill, DPM   History reviewed. No pertinent family history.   Social History   Tobacco Use   Smoking status: Former Smoker    Quit date: 2006    Years since quitting: 15.8   Smokeless  tobacco: Never Used   Tobacco comment: quit June 19 2004  Vaping Use   Vaping Use: Every day   Devices: cbd  Substance Use Topics   Alcohol use: Yes    Comment: rare   Drug use: No    Allergies as of 11/12/2019 - Review Complete 11/12/2019  Allergen Reaction Noted   Diclofenac Other (See Comments) 10/16/2013   Other Other (See Comments) 01/23/2015    Review of Systems:    All systems reviewed and negative except where noted in HPI.   Physical Exam:  BP (!) 146/99 (BP Location: Left Arm, Patient Position: Sitting, Cuff Size: Normal)    Pulse 78    Temp 98.3 F (36.8 C) (Oral)    Ht 5\' 3"  (1.6 m)    Wt 205 lb 2 oz (93 kg)    BMI 36.34 kg/m  No LMP recorded. Patient has had a hysterectomy.  General:   Alert,  Well-developed, well-nourished, pleasant and cooperative in NAD Head:  Normocephalic and atraumatic. Eyes:  Sclera clear,  no icterus.   Conjunctiva pink. Ears:  Normal auditory acuity. Nose:  No deformity, discharge, or lesions. Mouth:  No deformity or lesions,oropharynx pink & moist. Neck:  Supple; no masses or thyromegaly. Lungs:  Respirations even and unlabored.  Clear throughout to auscultation.   No wheezes, crackles, or rhonchi. No acute distress. Heart:  Regular rate and rhythm; no murmurs, clicks, rubs, or gallops. Abdomen:  Normal bowel sounds. Soft, diffusely distended, tympanic to percussion, diffusely tender, without masses, hepatosplenomegaly or hernias noted.  No guarding or rebound tenderness.   Rectal: Not performed Msk:  Symmetrical without gross deformities. Good, equal movement & strength bilaterally. Pulses:  Normal pulses noted. Extremities:  No clubbing or edema.  No cyanosis. Neurologic:  Alert and oriented x3;  grossly normal neurologically. Skin:  Intact without significant lesions or rashes. No jaundice. Psych:  Alert and cooperative. Normal mood and affect.  Imaging Studies: Reviewed  Assessment and Plan:   Destiny Wright is a 54  y.o. adult with obesity is seen in consultation for dyspepsia, lower abdominal pain, abnormal imaging revealing duodenitis and enteritis, EGD confirmed H. pylori gastritis causing his symptoms  Helicobacter pylori gastritis leading to dyspepsia: S/p triple therapy Recommend H. pylori breath test 4 weeks after completion of treatment, off PPI and H2 blockers, instructions provided to patient today Reassured him that he may have postinfectious dyspepsia symptoms for few weeks.  If his symptoms worsen, or H. pylori breath test is positive, recommend treatment with Pylera  Acid reflux, nonerosive, no evidence of esophagitis: Most likely secondary to body habitus and lifestyle as well as hiatal hernia Continue Pepcid 30 mg twice daily Discussed about antireflux lifestyle, information provided After eradication of H. pylori, if acid reflux symptoms are persistent, will evaluate for hiatal hernia repair  History of recurrent diverticulitis: Discussed about colonoscopy again today, patient said he will think about it Instructions on high-fiber diet provided   Follow up in 2 to 3 months   Cephas Darby, MD

## 2019-11-28 ENCOUNTER — Other Ambulatory Visit: Payer: Self-pay

## 2019-11-28 DIAGNOSIS — A048 Other specified bacterial intestinal infections: Secondary | ICD-10-CM

## 2019-11-29 LAB — H. PYLORI BREATH TEST: H pylori Breath Test: NEGATIVE

## 2020-02-12 ENCOUNTER — Other Ambulatory Visit: Payer: Self-pay

## 2020-02-13 ENCOUNTER — Encounter: Payer: Self-pay | Admitting: Gastroenterology

## 2020-02-13 ENCOUNTER — Other Ambulatory Visit: Payer: Self-pay

## 2020-02-13 ENCOUNTER — Ambulatory Visit: Payer: Medicare HMO | Admitting: Gastroenterology

## 2020-02-13 VITALS — BP 145/80 | HR 105 | Temp 98.0°F | Ht 63.0 in | Wt 192.2 lb

## 2020-02-13 DIAGNOSIS — R21 Rash and other nonspecific skin eruption: Secondary | ICD-10-CM | POA: Diagnosis not present

## 2020-02-13 DIAGNOSIS — L309 Dermatitis, unspecified: Secondary | ICD-10-CM

## 2020-02-13 MED ORDER — CALMOSEPTINE 0.44-20.6 % EX OINT: TOPICAL_OINTMENT | CUTANEOUS | 0 refills | Status: DC

## 2020-02-13 MED ORDER — CLOTRIMAZOLE 1 % EX CREA: 1.0000 "application " | TOPICAL_CREAM | Freq: Two times a day (BID) | CUTANEOUS | 0 refills | Status: DC

## 2020-02-13 MED ORDER — BACITRACIN-NEOMYCIN-POLYMYXIN 400-5-5000 EX OINT: 1.0000 "application " | TOPICAL_OINTMENT | Freq: Two times a day (BID) | CUTANEOUS | 0 refills | Status: DC

## 2020-02-13 NOTE — Progress Notes (Signed)
Cephas Darby, MD [address]  [place], [place] [address]  Main: [phone]  Fax: [phone]    Gastroenterology Consultation  Referring Provider:     Letta Median, MD Primary Care Physician:  Leonel Ramsay, MD Primary Gastroenterologist:  Dr. Cephas Darby Reason for Consultation: Perianal rash      HPI:   Destiny Wright is a 55 y.o. adult referred by Dr. Leonel Ramsay, MD  for consultation & management of approximately 3 weeks history of abdominal pain, lower abdominal as well as epigastric region associated with nausea, globus sensation, severe burning in throat.  Patient is unable to tolerate any food, reports severe regurgitation, he does report significant abdominal bloating.  He also reports 2 loose bowel movements this morning, which were nonbloody.  Due to worsening of the symptoms, he went to ER on 09/26/2019, underwent CT abdomen pelvis which revealed possible duodenitis and enteritis.  Labs were significant for mildly elevated serum lipase, mild AKI.  UA was negative.  Troponins were negative.  Patient has been chronically on Prevacid 15 mg daily since 2003 when he was told that he may have hiatal hernia.  He is recently switched to Pepcid since ER visit, taking it once a day.  He does vape CBD daily  Follow-up visit 11/12/2019 Patient underwent upper endoscopy, confirmed H. pylori gastritis causing his symptoms.  Therefore, treated with triple therapy.  Patient finished antibiotics of 2 weeks course, tolerated well.  He reports significant improvement in his symptoms.  However, last week, he did experience mild abdominal discomfort associated with nausea as well as 1-2 loose stools which were nonbloody.  Patient had several questions regarding the infection its contractility, upper endoscopy findings, reflux as well.  He is currently taking Prevacid twice daily for reflux.  He does report globus sensation, upper endoscopy was however  normal.  He switched from carbonated beverages to sweet tea.  He has been avoiding red meat due to dyspepsia symptoms.  He lost about 20 pounds since the onset of symptoms.  His weight has been stabilized now.  Follow-up visit 02/13/2020 Patient is here to discuss about new onset of perianal/gluteal cleft rash that started about 2 weeks ago.  He reports that he had vaginal yeast infection for which he was being treated in early January by his PCP.  He then noticed severe burning in his gluteal cleft as well as perianal area, as ripping sensation of his skin along the gluteal cleft.  Patient has not tried anything yet.  He reports intense burning of the area.  He does have large perianal skin tags, thrombosed hemorrhoids in the past.  He denies any constipation or diarrhea.  He denies any other GI symptoms.  Is currently taking Prevacid 30 mg twice daily for acid reflux only.  Patient continues to lose weight, trying to follow healthy diet.  Had history of H. pylori infection, treated with triple therapy, confirmed eradication based on H. pylori breath test in November.  NSAIDs: Used to take indomethacin which he has stopped due to AKI in 07/2019  Antiplts/Anticoagulants/Anti thrombotics: None  GI Procedures: Upper endoscopy 2003 Upper endoscopy 10/08/2019 - Erythematous duodenopathy. Biopsied. - Erythematous mucosa in the stomach. Biopsied. - Small hiatal hernia. - Esophagogastric landmarks identified. - Normal gastroesophageal junction and esophagus.  DIAGNOSIS:  A. DUODENUM, RANDOM; COLD BIOPSY:  - ENTERIC MUCOSA WITH PRESERVED VILLOUS ARCHITECTURE AND NO SIGNIFICANT  HISTOPATHOLOGIC CHANGE.  - NEGATIVE FOR FEATURES OF CELIAC, DYSPLASIA,  AND MALIGNANCY.   B. STOMACH, RANDOM; COLD BIOPSY:  - GASTRIC ANTRAL AND OXYNTIC MUCOSA WITH CHRONIC ACTIVE GASTRITIS WITH  HELICOBACTER PYLORI TYPE ORGANISMS.  - NEGATIVE FOR DYSPLASIA AND MALIGNANCY.    Did not undergo colonoscopy yet He denies  family history of GI malignancy  Past Medical History:  Diagnosis Date  . Anxiety   . Cancer ([place])   . Chronic kidney disease   . Depression   . Dysrhythmia    PVC  . Endometriosis   . GERD (gastroesophageal reflux disease)   . Heart murmur   . PVC (premature ventricular contraction)   . Reflux   . Thyroid disease   . Vitamin D deficiency     Past Surgical History:  Procedure Laterality Date  . ABDOMINAL HYSTERECTOMY    . BREAST SURGERY    . ESOPHAGOGASTRODUODENOSCOPY (EGD) WITH PROPOFOL N/A 10/16/2019   Procedure: ESOPHAGOGASTRODUODENOSCOPY (EGD) WITH PROPOFOL;  Surgeon: Lin Landsman, MD;  Location: [facility];  Service: Gastroenterology;  Laterality: N/A;  . KNEE ARTHROSCOPY    . LAPAROSCOPY    . SINUSOTOMY    . TONSILLECTOMY AND ADENOIDECTOMY    . UPPER GI ENDOSCOPY      Current Outpatient Medications:  .  atorvastatin (LIPITOR) 20 MG tablet, Take 20 mg by mouth daily., Disp: , Rfl: 0 .  calcium-vitamin D (OSCAL WITH D) 500-200 MG-UNIT tablet, Take by mouth., Disp: , Rfl:  .  Cinnamon 500 MG capsule, Take by mouth., Disp: , Rfl:  .  clotrimazole (LOTRIMIN) 1 % cream, Apply 1 application topically 2 (two) times daily., Disp: 30 g, Rfl: 0 .  docusate sodium (COLACE) 100 MG capsule, Take by mouth., Disp: , Rfl:  .  fenofibrate 54 MG tablet, , Disp: , Rfl:  .  gabapentin (NEURONTIN) 300 MG capsule, Take 600 mg by mouth 2 (two) times daily., Disp: , Rfl:  .  HYDROcodone-acetaminophen (NORCO/VICODIN) 5-325 MG tablet, Take by mouth., Disp: , Rfl:  .  lansoprazole (PREVACID) 15 MG capsule, Take by mouth., Disp: , Rfl:  .  lisinopril (ZESTRIL) 10 MG tablet, Take by mouth., Disp: , Rfl:  .  Menthol-Zinc Oxide (CALMOSEPTINE) 0.44-20.6 % OINT, Apply as needed 2 to 3 times a day, Disp: 71 g, Rfl: 0 .  NEEDLE, DISP, 18 G (B-D HYPODERMIC NEEDLE 18GX1.5") 18G X 1-1/2" MISC, Use q2 weeks as directed, Disp: , Rfl:  .  neomycin-bacitracin-polymyxin (NEOSPORIN) ointment, Apply 1  application topically every 12 (twelve) hours., Disp: 15 g, Rfl: 0 .  NON FORMULARY, Med Name: CBD ( vapes or tinctures as needed), Disp: , Rfl:  .  NONFORMULARY OR COMPOUNDED ITEM, [place] compound - Musculosketetal pain or inflammation: Baclofen 3%, Clonidine 0.2%, Diclofenac 4%, Gabapentin 6%, Lidocaine 3%., Disp: , Rfl:  .  ondansetron (ZOFRAN ODT) 4 MG disintegrating tablet, Take 1 tablet (4 mg total) by mouth every 8 (eight) hours as needed for nausea or vomiting., Disp: 20 tablet, Rfl: 1 .  SYNTHROID 150 MCG tablet, Take by mouth., Disp: , Rfl:  .  SYRINGE-NEEDLE, DISP, 3 ML (B-D 3CC LUER-LOK SYR 25GX5/8") 25G X 5/8" 3 ML MISC, USE TO INJECT TESTOSTERONE EVERY 2 WEEKS AS DIRECTED, Disp: , Rfl:  .  testosterone cypionate (DEPOTESTOSTERONE CYPIONATE) 200 MG/ML injection, SMARTSIG:0.8 Milliliter(s) IM Every 2 Weeks, Disp: , Rfl:  .  VITAMIN D PO, 2,000 Units., Disp: , Rfl:   Current Facility-Administered Medications:  .  triamcinolone acetonide (KENALOG) 10 MG/ML injection 10 mg, 10 mg, Other, Once, Ila Mcgill  S, DPM   History reviewed. No pertinent family history.   Social History   Tobacco Use  . Smoking status: Former Smoker    Quit date: 2006    Years since quitting: 16.0  . Smokeless tobacco: Never Used  . Tobacco comment: quit June 19 2004  Vaping Use  . Vaping Use: Every day  . Devices: cbd  Substance Use Topics  . Alcohol use: Yes    Comment: rare  . Drug use: No    Allergies as of 02/13/2020 - Review Complete 02/13/2020  Allergen Reaction Noted  . Diclofenac Other (See Comments) 10/16/2013  . Other Other (See Comments) 01/23/2015    Review of Systems:    All systems reviewed and negative except where noted in HPI.   Physical Exam:  BP (!) 145/80 (BP Location: Left Arm, Patient Position: Sitting, Cuff Size: Normal)   Pulse (!) 105   Temp 98 F (36.7 C) (Oral)   Ht 5\' 3"  (1.6 m)   Wt 192 lb 4 oz (87.2 kg)   BMI 34.06 kg/m  No LMP recorded.  Patient has had a hysterectomy.  General:   Alert,  Well-developed, well-nourished, pleasant and cooperative in NAD Head:  Normocephalic and atraumatic. Eyes:  Sclera clear, no icterus.   Conjunctiva pink. Ears:  Normal auditory acuity. Nose:  No deformity, discharge, or lesions. Mouth:  No deformity or lesions,oropharynx pink & moist. Neck:  Supple; no masses or thyromegaly. Lungs:  Respirations even and unlabored.  Clear throughout to auscultation.   No wheezes, crackles, or rhonchi. No acute distress. Heart:  Regular rate and rhythm; no murmurs, clicks, rubs, or gallops. Abdomen:  Normal bowel sounds. Soft, nontender, not distended, without masses, hepatosplenomegaly or hernias noted.  No guarding or rebound tenderness.   Rectal: Large perianal skin tags, nontender, severely erythematous, discrete rash in the perianal area extending posteriorly along the gluteal cleft with superficial skin breakdown Msk:  Symmetrical without gross deformities. Good, equal movement & strength bilaterally. Pulses:  Normal pulses noted. Extremities:  No clubbing or edema.  No cyanosis. Neurologic:  Alert and oriented x3;  grossly normal neurologically. Skin:  Intact without significant lesions or rashes. No jaundice. Psych:  Alert and cooperative. Normal mood and affect.  Imaging Studies: Reviewed  Assessment and Plan:   Destiny Wright is a 55 y.o. adult with obesity, history of H. pylori gastritis s/p triple therapy, confirm eradication based on H. pylori breath test, chronic GERD controlled on PPI is seen in consultation for 2 weeks history of perianal and gluteal cleft rash  Perianal and gluteal cleft rash: Could be secondary to perianal streptococcal dermatitis or fungal infection Recommend 1% clotrimazole 2 times daily for 10 to 14 days Recommend Neosporin 2 times a day Recommend calmoseptine paste as needed If the rash is not improving in about a week or so, advised patient to contact his  PCP Patient is recently tested for HIV, HCV, RPR which were negative.  Hemoglobin A1c is normal  Perianal skin tags: These need to be surgically removed in future  Chronic GERD EGD revealed normal esophagus and GE junction Can decrease Prevacid to 30 mg once a day Continue antireflux lifestyle   Follow up as needed   Cephas Darby, MD

## 2020-02-13 NOTE — Patient Instructions (Signed)
1. Sent two creams to your pharmacy

## 2020-11-28 DIAGNOSIS — Z789 Other specified health status: Secondary | ICD-10-CM | POA: Diagnosis not present

## 2020-11-28 DIAGNOSIS — Z5181 Encounter for therapeutic drug level monitoring: Secondary | ICD-10-CM | POA: Diagnosis not present

## 2020-11-28 DIAGNOSIS — F649 Gender identity disorder, unspecified: Secondary | ICD-10-CM | POA: Diagnosis not present

## 2020-12-02 DIAGNOSIS — I10 Essential (primary) hypertension: Secondary | ICD-10-CM | POA: Diagnosis not present

## 2020-12-02 DIAGNOSIS — N1831 Chronic kidney disease, stage 3a: Secondary | ICD-10-CM | POA: Diagnosis not present

## 2020-12-02 DIAGNOSIS — R062 Wheezing: Secondary | ICD-10-CM | POA: Diagnosis not present

## 2020-12-03 DIAGNOSIS — F649 Gender identity disorder, unspecified: Secondary | ICD-10-CM | POA: Diagnosis not present

## 2020-12-03 DIAGNOSIS — Z5181 Encounter for therapeutic drug level monitoring: Secondary | ICD-10-CM | POA: Diagnosis not present

## 2020-12-03 DIAGNOSIS — Z79890 Hormone replacement therapy (postmenopausal): Secondary | ICD-10-CM | POA: Diagnosis not present

## 2020-12-03 DIAGNOSIS — I10 Essential (primary) hypertension: Secondary | ICD-10-CM | POA: Diagnosis not present

## 2020-12-03 DIAGNOSIS — Z789 Other specified health status: Secondary | ICD-10-CM | POA: Diagnosis not present

## 2020-12-03 DIAGNOSIS — E039 Hypothyroidism, unspecified: Secondary | ICD-10-CM | POA: Diagnosis not present

## 2020-12-03 DIAGNOSIS — N1832 Chronic kidney disease, stage 3b: Secondary | ICD-10-CM | POA: Diagnosis not present

## 2020-12-22 DIAGNOSIS — Z87891 Personal history of nicotine dependence: Secondary | ICD-10-CM | POA: Diagnosis not present

## 2020-12-22 DIAGNOSIS — J449 Chronic obstructive pulmonary disease, unspecified: Secondary | ICD-10-CM | POA: Diagnosis not present

## 2020-12-24 DIAGNOSIS — Z113 Encounter for screening for infections with a predominantly sexual mode of transmission: Secondary | ICD-10-CM | POA: Diagnosis not present

## 2020-12-24 DIAGNOSIS — Z720 Tobacco use: Secondary | ICD-10-CM | POA: Diagnosis not present

## 2020-12-24 DIAGNOSIS — Z09 Encounter for follow-up examination after completed treatment for conditions other than malignant neoplasm: Secondary | ICD-10-CM | POA: Diagnosis not present

## 2020-12-24 DIAGNOSIS — E039 Hypothyroidism, unspecified: Secondary | ICD-10-CM | POA: Diagnosis not present

## 2020-12-24 DIAGNOSIS — N189 Chronic kidney disease, unspecified: Secondary | ICD-10-CM | POA: Diagnosis not present

## 2020-12-24 DIAGNOSIS — I129 Hypertensive chronic kidney disease with stage 1 through stage 4 chronic kidney disease, or unspecified chronic kidney disease: Secondary | ICD-10-CM | POA: Diagnosis not present

## 2020-12-24 DIAGNOSIS — Z23 Encounter for immunization: Secondary | ICD-10-CM | POA: Diagnosis not present

## 2020-12-24 DIAGNOSIS — J449 Chronic obstructive pulmonary disease, unspecified: Secondary | ICD-10-CM | POA: Diagnosis not present

## 2020-12-24 DIAGNOSIS — E785 Hyperlipidemia, unspecified: Secondary | ICD-10-CM | POA: Diagnosis not present

## 2020-12-24 DIAGNOSIS — I10 Essential (primary) hypertension: Secondary | ICD-10-CM | POA: Diagnosis not present

## 2020-12-24 DIAGNOSIS — E7849 Other hyperlipidemia: Secondary | ICD-10-CM | POA: Diagnosis not present

## 2020-12-24 DIAGNOSIS — Z114 Encounter for screening for human immunodeficiency virus [HIV]: Secondary | ICD-10-CM | POA: Diagnosis not present

## 2020-12-24 DIAGNOSIS — Z7251 High risk heterosexual behavior: Secondary | ICD-10-CM | POA: Diagnosis not present

## 2021-01-07 ENCOUNTER — Other Ambulatory Visit: Payer: Self-pay

## 2021-01-07 ENCOUNTER — Encounter: Payer: Self-pay | Admitting: Emergency Medicine

## 2021-01-07 DIAGNOSIS — N189 Chronic kidney disease, unspecified: Secondary | ICD-10-CM | POA: Insufficient documentation

## 2021-01-07 DIAGNOSIS — R1032 Left lower quadrant pain: Secondary | ICD-10-CM | POA: Diagnosis present

## 2021-01-07 DIAGNOSIS — Z87891 Personal history of nicotine dependence: Secondary | ICD-10-CM | POA: Diagnosis not present

## 2021-01-07 DIAGNOSIS — Z859 Personal history of malignant neoplasm, unspecified: Secondary | ICD-10-CM | POA: Insufficient documentation

## 2021-01-07 DIAGNOSIS — Z79899 Other long term (current) drug therapy: Secondary | ICD-10-CM | POA: Insufficient documentation

## 2021-01-07 DIAGNOSIS — K5792 Diverticulitis of intestine, part unspecified, without perforation or abscess without bleeding: Secondary | ICD-10-CM | POA: Insufficient documentation

## 2021-01-07 DIAGNOSIS — K529 Noninfective gastroenteritis and colitis, unspecified: Secondary | ICD-10-CM | POA: Diagnosis not present

## 2021-01-07 DIAGNOSIS — I700 Atherosclerosis of aorta: Secondary | ICD-10-CM | POA: Diagnosis not present

## 2021-01-07 DIAGNOSIS — K5732 Diverticulitis of large intestine without perforation or abscess without bleeding: Secondary | ICD-10-CM | POA: Diagnosis not present

## 2021-01-07 LAB — COMPREHENSIVE METABOLIC PANEL
ALT: 78 U/L — ABNORMAL HIGH (ref 0–44)
AST: 48 U/L — ABNORMAL HIGH (ref 15–41)
Albumin: 4.4 g/dL (ref 3.5–5.0)
Alkaline Phosphatase: 95 U/L (ref 38–126)
Anion gap: 6 (ref 5–15)
BUN: 15 mg/dL (ref 6–20)
CO2: 27 mmol/L (ref 22–32)
Calcium: 10.1 mg/dL (ref 8.9–10.3)
Chloride: 100 mmol/L (ref 98–111)
Creatinine, Ser: 1.5 mg/dL — ABNORMAL HIGH (ref 0.44–1.00)
GFR, Estimated: 41 mL/min — ABNORMAL LOW (ref >60)
Glucose, Bld: 98 mg/dL (ref 70–99)
Potassium: 3.9 mmol/L (ref 3.5–5.1)
Sodium: 133 mmol/L — ABNORMAL LOW (ref 135–145)
Total Bilirubin: 1.1 mg/dL (ref 0.3–1.2)
Total Protein: 8.7 g/dL — ABNORMAL HIGH (ref 6.5–8.1)

## 2021-01-07 LAB — CBC
HCT: 45.6 % (ref 36.0–46.0)
Hemoglobin: 16.1 g/dL — ABNORMAL HIGH (ref 12.0–15.0)
MCH: 33.6 pg (ref 26.0–34.0)
MCHC: 35.3 g/dL (ref 30.0–36.0)
MCV: 95.2 fL (ref 80.0–100.0)
Platelets: 232 10*3/uL (ref 150–400)
RBC: 4.79 MIL/uL (ref 3.87–5.11)
RDW: 12.2 % (ref 11.5–15.5)
WBC: 8.5 10*3/uL (ref 4.0–10.5)
nRBC: 0 % (ref 0.0–0.2)

## 2021-01-07 LAB — URINALYSIS, ROUTINE W REFLEX MICROSCOPIC
Glucose, UA: NEGATIVE mg/dL
Hgb urine dipstick: NEGATIVE
Nitrite: NEGATIVE
Protein, ur: 30 mg/dL — AB
Specific Gravity, Urine: >1.03 — ABNORMAL HIGH (ref 1.005–1.030)
pH: 5 (ref 5.0–8.0)

## 2021-01-07 LAB — LIPASE, BLOOD: Lipase: 45 U/L (ref 11–51)

## 2021-01-07 NOTE — ED Triage Notes (Signed)
Pt reports that they are having lower left pelvic pain. Denies any N/V/D has had a low grade fever off an on for the last several days.

## 2021-01-08 ENCOUNTER — Emergency Department
Admission: EM | Admit: 2021-01-08 | Discharge: 2021-01-08 | Disposition: A | Discharge status: Home / Self Care | Payer: Medicare HMO | Attending: Emergency Medicine | Admitting: Emergency Medicine

## 2021-01-08 ENCOUNTER — Emergency Department: Payer: Medicare HMO

## 2021-01-08 ENCOUNTER — Encounter: Payer: Self-pay | Admitting: Emergency Medicine

## 2021-01-08 DIAGNOSIS — K529 Noninfective gastroenteritis and colitis, unspecified: Secondary | ICD-10-CM | POA: Diagnosis not present

## 2021-01-08 DIAGNOSIS — K5792 Diverticulitis of intestine, part unspecified, without perforation or abscess without bleeding: Secondary | ICD-10-CM

## 2021-01-08 DIAGNOSIS — I700 Atherosclerosis of aorta: Secondary | ICD-10-CM | POA: Diagnosis not present

## 2021-01-08 DIAGNOSIS — K5732 Diverticulitis of large intestine without perforation or abscess without bleeding: Secondary | ICD-10-CM | POA: Diagnosis not present

## 2021-01-08 MED ORDER — ACETAMINOPHEN 500 MG PO TABS
1000.0000 mg | ORAL_TABLET | Freq: Once | ORAL | Status: AC
  Administered 2021-01-08: 08:00:00 1000 mg via ORAL
  Filled 2021-01-08: qty 2

## 2021-01-08 MED ORDER — OXYCODONE HCL 5 MG PO TABS
5.0000 mg | ORAL_TABLET | Freq: Once | ORAL | Status: AC
  Administered 2021-01-08: 08:00:00 5 mg via ORAL
  Filled 2021-01-08: qty 1

## 2021-01-08 NOTE — ED Provider Notes (Signed)
[facility] Emergency Department Provider Note ____________________________________________   Event Date/Time   First MD Initiated Contact with Patient 01/08/21 [phone]     (approximate)  I have reviewed the triage vital signs and the nursing notes.  HISTORY  Chief Complaint Abdominal Pain   HPI Destiny Wright is a 55 y.o. adultwho presents to the ED for evaluation of abd pain.   Chart review indicates obese F->M transgender patient with history of CKD and previous smoking.  S/p hysterectomy for endometriosis.  Currently on testosterone, Synthroid. Hx diverticular dz., never had a colonoscopy but has had an EGD where he was diagnosed with H. Pylori, treated.  Who presents to the ED for evaluation of about 36 hours of LLQ abdominal pain.  Reports no diarrhea, hematochezia or melena, and reports feeling more constipated where it has been nearly 2 days since a bowel movement and that one was fairly firm and constipated.  Denies emesis, nausea or fever.  Reports having a few Norco at home that he occasionally uses for arthritis, but did not use it for this because did not want to "mask the pain." Reports moderate to severe pain to the LLQ that is nonradiating.   Past Medical History:  Diagnosis Date   Anxiety    Cancer ([place])    Chronic kidney disease    Depression    Dysrhythmia    PVC   Endometriosis    GERD (gastroesophageal reflux disease)    Heart murmur    PVC (premature ventricular contraction)    Reflux    Thyroid disease    Vitamin D deficiency     Patient Active Problem List   Diagnosis Date Noted   Dyspepsia    Murmur, cardiac 05/14/2016   Premature ventricular contractions 05/14/2016   Endometriosis 08/31/2013    Past Surgical History:  Procedure Laterality Date   ABDOMINAL HYSTERECTOMY     BREAST SURGERY     ESOPHAGOGASTRODUODENOSCOPY (EGD) WITH PROPOFOL N/A 10/16/2019   Procedure: ESOPHAGOGASTRODUODENOSCOPY (EGD) WITH PROPOFOL;   Surgeon: Lin Landsman, MD;  Location: [facility];  Service: Gastroenterology;  Laterality: N/A;   KNEE ARTHROSCOPY     LAPAROSCOPY     SINUSOTOMY     TONSILLECTOMY AND ADENOIDECTOMY     UPPER GI ENDOSCOPY      Prior to Admission medications   Medication Sig Start Date End Date Taking? Authorizing Provider  atorvastatin (LIPITOR) 20 MG tablet Take 20 mg by mouth daily. 06/19/14   [provider]  calcium-vitamin D (OSCAL WITH D) 500-200 MG-UNIT tablet Take by mouth.    [provider]  Cinnamon 500 MG capsule Take by mouth. 08/13/19   [provider]  clotrimazole (LOTRIMIN) 1 % cream Apply 1 application topically 2 (two) times daily. 02/13/20   Lin Landsman, MD  docusate sodium (COLACE) 100 MG capsule Take by mouth. 08/13/19   [provider]  fenofibrate 54 MG tablet  10/01/19   [provider]  gabapentin (NEURONTIN) 300 MG capsule Take 600 mg by mouth 2 (two) times daily. 09/26/19   [provider]  HYDROcodone-acetaminophen (NORCO/VICODIN) 5-325 MG tablet Take by mouth.    [provider]  lansoprazole (PREVACID) 15 MG capsule Take by mouth.    [provider]  lisinopril (ZESTRIL) 10 MG tablet Take by mouth. 11/29/19   [provider]  Menthol-Zinc Oxide (CALMOSEPTINE) 0.44-20.6 % OINT Apply as needed 2 to 3 times a day 02/13/20   Lin Landsman, MD  NEEDLE, DISP, 18 G (B-D HYPODERMIC NEEDLE 18GX1.5") 18G X 1-1/2" MISC Use q2 weeks as directed 07/17/19   [provider]  neomycin-bacitracin-polymyxin (NEOSPORIN) ointment Apply 1 application topically every 12 (twelve) hours. 02/13/20   Lin Landsman, MD  NON FORMULARY Med Name: CBD ( vapes or tinctures as needed)    [provider]  NONFORMULARY OR COMPOUNDED [place] compound - Musculosketetal pain or inflammation: Baclofen 3%, Clonidine 0.2%, Diclofenac 4%, Gabapentin 6%, Lidocaine 3%.    Wallene Huh,  DPM  ondansetron (ZOFRAN ODT) 4 MG disintegrating tablet Take 1 tablet (4 mg total) by mouth every 8 (eight) hours as needed for nausea or vomiting. 10/08/19   Vanga, Tally Due, MD  SYNTHROID 150 MCG tablet Take by mouth. 08/21/19   [provider]  SYRINGE-NEEDLE, DISP, 3 ML (B-D 3CC LUER-LOK SYR 25GX5/8") 25G X 5/8" 3 ML MISC USE TO INJECT TESTOSTERONE EVERY 2 WEEKS AS DIRECTED 07/17/19   [provider]  testosterone cypionate (DEPOTESTOSTERONE CYPIONATE) 200 MG/ML injection SMARTSIG:0.8 Milliliter(s) IM Every 2 Weeks 08/22/19   [provider]  VITAMIN D PO 2,000 Units. 08/13/19   [provider]    Allergies Diclofenac and Other  No family history on file.  Social History Social History   Tobacco Use   Smoking status: Former    Types: Cigarettes    Quit date: 2006    Years since quitting: 16.9   Smokeless tobacco: Never   Tobacco comments:    quit June 19 2004  Vaping Use   Vaping Use: Every day   Devices: cbd  Substance Use Topics   Alcohol use: Yes    Comment: rare   Drug use: No    Review of Systems  Constitutional: No fever/chills Eyes: No visual changes. ENT: No sore throat. Cardiovascular: Denies chest pain. Respiratory: Denies shortness of breath. Gastrointestinal: Positive for abdominal pain  No nausea, no vomiting.  No diarrhea.   Genitourinary: Negative for dysuria. Musculoskeletal: Negative for back pain. Skin: Negative for rash. Neurological: Negative for headaches, focal weakness or numbness.  ____________________________________________   PHYSICAL EXAM:  VITAL SIGNS: Vitals:   01/08/21 0425 01/08/21 0715  BP: (!) 135/92 (!) 138/95  Pulse: 93 83  Resp: 18 18  Temp: 98.2 F (36.8 C)   SpO2: 98% 99%    Constitutional: Alert and oriented. Well appearing and in no acute distress.  Obese, pleasant and conversational. Eyes: Conjunctivae are normal. PERRL. EOMI. Head: Atraumatic. Nose: No  congestion/rhinnorhea. Mouth/Throat: Mucous membranes are moist.  Oropharynx non-erythematous. Neck: No stridor. No cervical spine tenderness to palpation. Cardiovascular: Normal rate, regular rhythm. Grossly normal heart sounds.  Good peripheral circulation. Respiratory: Normal respiratory effort.  No retractions. Lungs CTAB. Gastrointestinal: Soft , nondistended,. No CVA tenderness. Diffuse lower abdominal tenderness with some voluntary guarding. Musculoskeletal: No lower extremity tenderness nor edema.  No joint effusions. No signs of acute trauma. Neurologic:  Normal speech and language. No gross focal neurologic deficits are appreciated. No gait instability noted. Skin:  Skin is warm, dry and intact. No rash noted. Psychiatric: Mood and affect are normal. Speech and behavior are normal. ____________________________________________   LABS (all labs ordered are listed, but only abnormal results are displayed)  Labs Reviewed  COMPREHENSIVE METABOLIC PANEL - Abnormal; Notable for the following components:      Result Value   Sodium 133 (*)    Creatinine, Ser 1.50 (*)    Total Protein 8.7 (*)    AST 48 (*)  ALT 78 (*)    GFR, Estimated 41 (*)    All other components within normal limits  CBC - Abnormal; Notable for the following components:   Hemoglobin 16.1 (*)    All other components within normal limits  URINALYSIS, ROUTINE W REFLEX MICROSCOPIC - Abnormal; Notable for the following components:   APPearance CLEAR (*)    Specific Gravity, Urine >1.030 (*)    Bilirubin Urine SMALL (*)    Ketones, ur TRACE (*)    Protein, ur 30 (*)    Leukocytes,Ua TRACE (*)    Bacteria, UA RARE (*)    All other components within normal limits  LIPASE, BLOOD   ____________________________________________  12 Lead EKG   ____________________________________________  RADIOLOGY  ED MD interpretation:    Official radiology report(s): CT ABDOMEN PELVIS WO CONTRAST  Result Date:  01/08/2021 CLINICAL DATA:  Left lower abdominal pain EXAM: CT ABDOMEN AND PELVIS WITHOUT CONTRAST TECHNIQUE: Multidetector CT imaging of the abdomen and pelvis was performed following the standard protocol without IV contrast. COMPARISON:  09/26/2019 FINDINGS: Lower chest: Stable mild scarring in the posterior basal segment right lower lobe. Hepatobiliary: Unremarkable Pancreas: Unremarkable Spleen: Unremarkable Adrenals/Urinary Tract: Unremarkable Stomach/Bowel: Scattered sigmoid colon diverticula with mild inflammatory stranding along diverticula in the proximal sigmoid colon on images 40 through 46 of series 5 most compatible with mild acute diverticulitis. No extraluminal gas or abscess identified. Vascular/Lymphatic: Mild abdominal aortic atherosclerotic calcification. No pathologic adenopathy is identified. Reproductive: Unremarkable Other: No supplemental non-categorized findings. Musculoskeletal: Unremarkable IMPRESSION: 1. Mild acute diverticulitis along the proximal sigmoid colon. No extraluminal gas or abscess. 2.  Aortic Atherosclerosis (ICD10-I70.0). Electronically Signed   By: Van Clines M.D.   On: 01/08/2021 08:18    ____________________________________________   PROCEDURES and INTERVENTIONS  Procedure(s) performed (including Critical Care):  Procedures  Medications  acetaminophen (TYLENOL) tablet 1,000 mg (1,000 mg Oral Given 01/08/21 0743)  oxyCODONE (Oxy IR/ROXICODONE) immediate release tablet 5 mg (5 mg Oral Given 01/08/21 0742)    ____________________________________________   MDM / ED COURSE   55 year old transgender patient presents to the ED with evidence of acute uncomplicated diverticulitis amenable to outpatient management.  Normal vitals.  No evidence of sepsis or systemic illness.  No evidence of abscess, perforation on CT. no evidence of UTI blood work is generally benign.  We will refer her back to GI for colonoscopy and discussed management of  diverticulitis at home.  Clinical Course as of 01/08/21 1434  Thu Jan 08, 2021  0914 Reassessed.  Feeling better.  We discussed uncomplicated diverticulitis and no need for antibiotics at this time.  We discussed following up with GI, need for colonoscopy and return precautions for the ED.  Discussed dietary adjustments [DS]    Clinical Course User Index [DS] Vladimir Crofts, MD    ____________________________________________   FINAL CLINICAL IMPRESSION(S) / ED DIAGNOSES  Final diagnoses:  Acute diverticulitis     ED Discharge Orders     None        Karim Aiello Tamala Julian   Note:  This document was prepared using Dragon voice recognition software and may include unintentional dictation errors.    Vladimir Crofts, MD 01/08/21 1435

## 2021-01-08 NOTE — Discharge Instructions (Addendum)
Use Tylenol for pain and fevers.  Up to 1000 mg per dose, up to 4 times per day.  Do not take more than 4000 mg of Tylenol/acetaminophen within 24 hours..  Use your hydrocodone as needed on top of this for more severe pain.  Reach out to Dr. Marius Ditch at the GI clinic to discuss having colonoscopy.  If you develop more severe pain, high fevers, please return to the ED.

## 2021-01-08 NOTE — ED Notes (Signed)
Patient transported to CT 

## 2021-01-16 DIAGNOSIS — Z20822 Contact with and (suspected) exposure to covid-19: Secondary | ICD-10-CM | POA: Diagnosis not present

## 2021-05-22 IMAGING — CR DG CHEST 2V
2 series · 2 of 2 positions shown · non-contrast
Comparison: 09/24/2006

CLINICAL DATA: Upper abdominal and chest pain for several weeks,
nausea

EXAM:
CHEST - 2 VIEW

[chest pa]
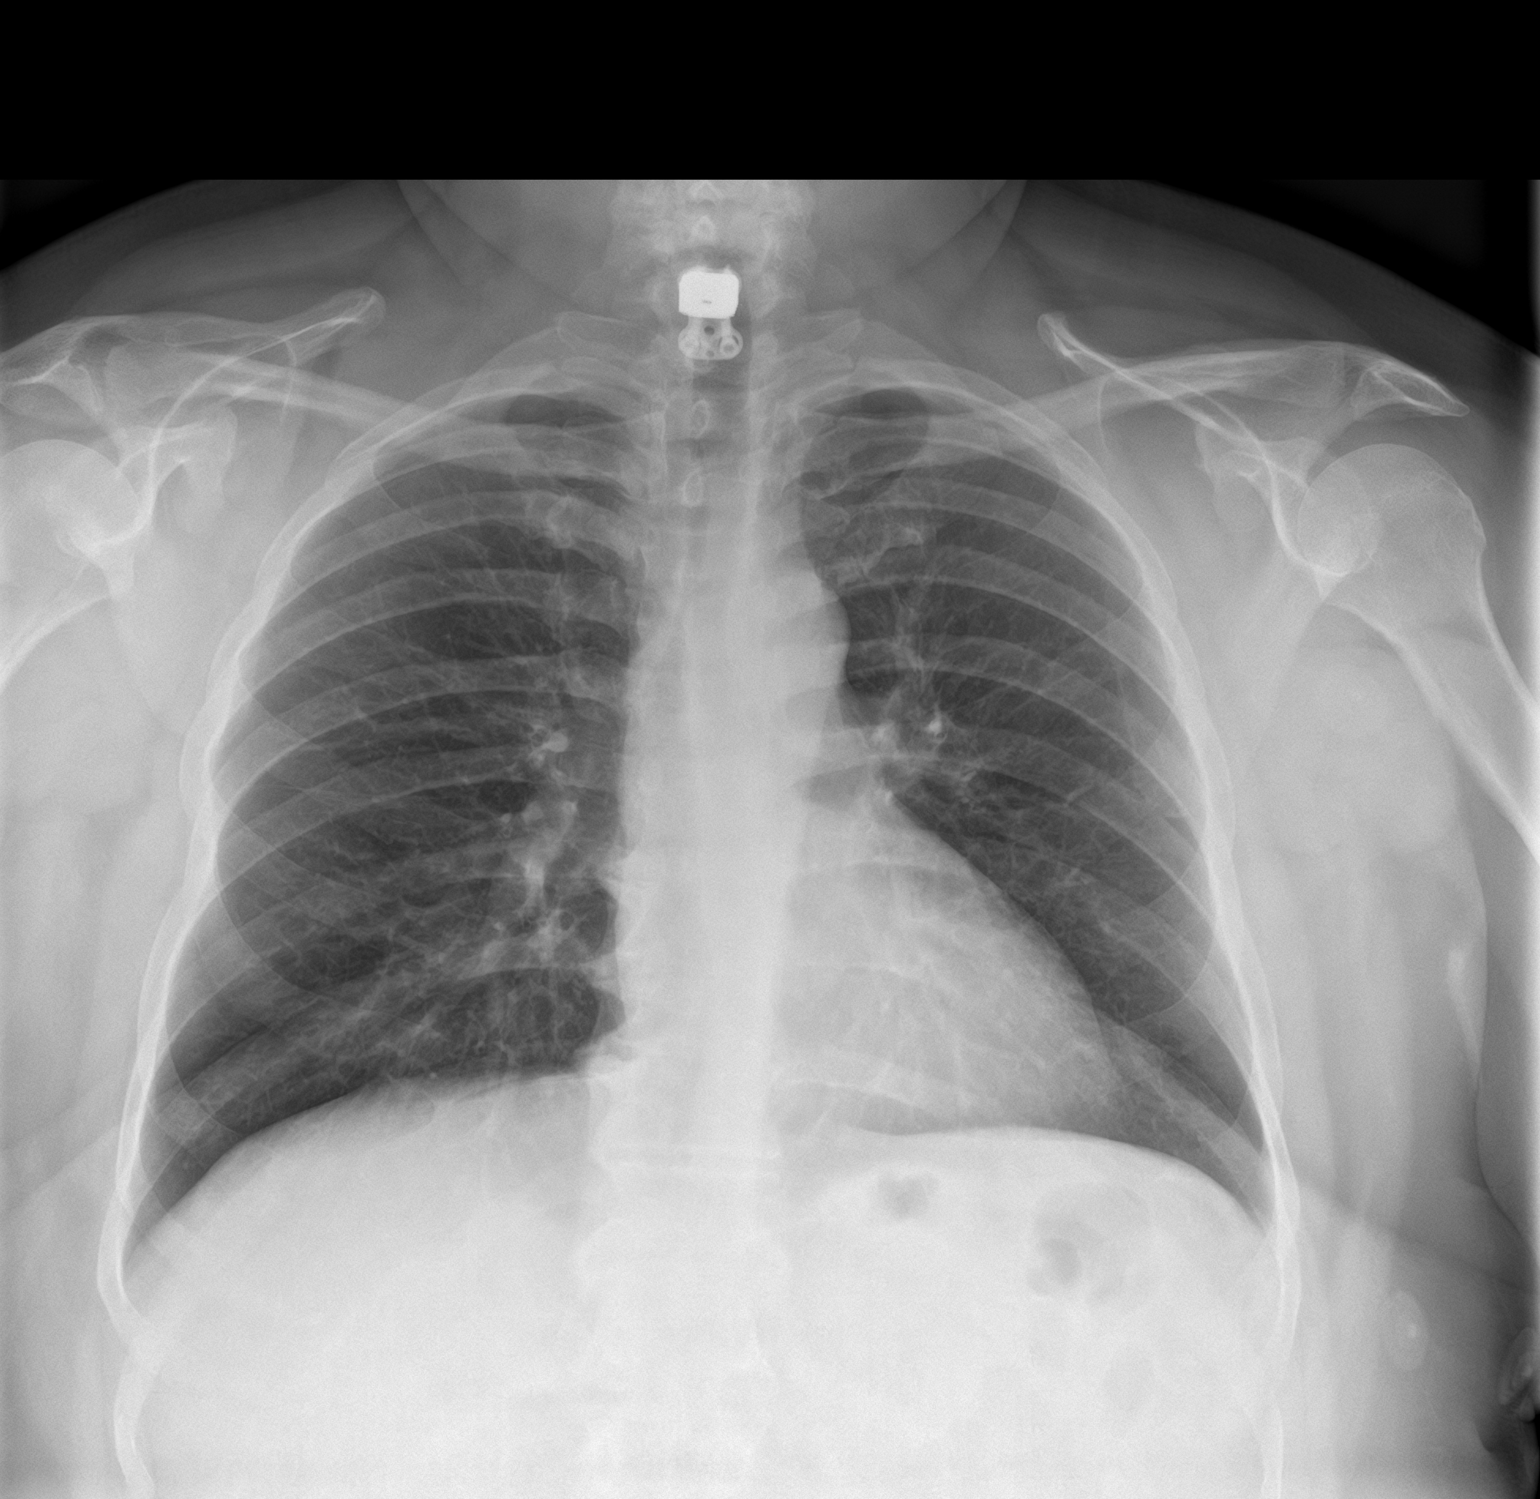

[chest lat]
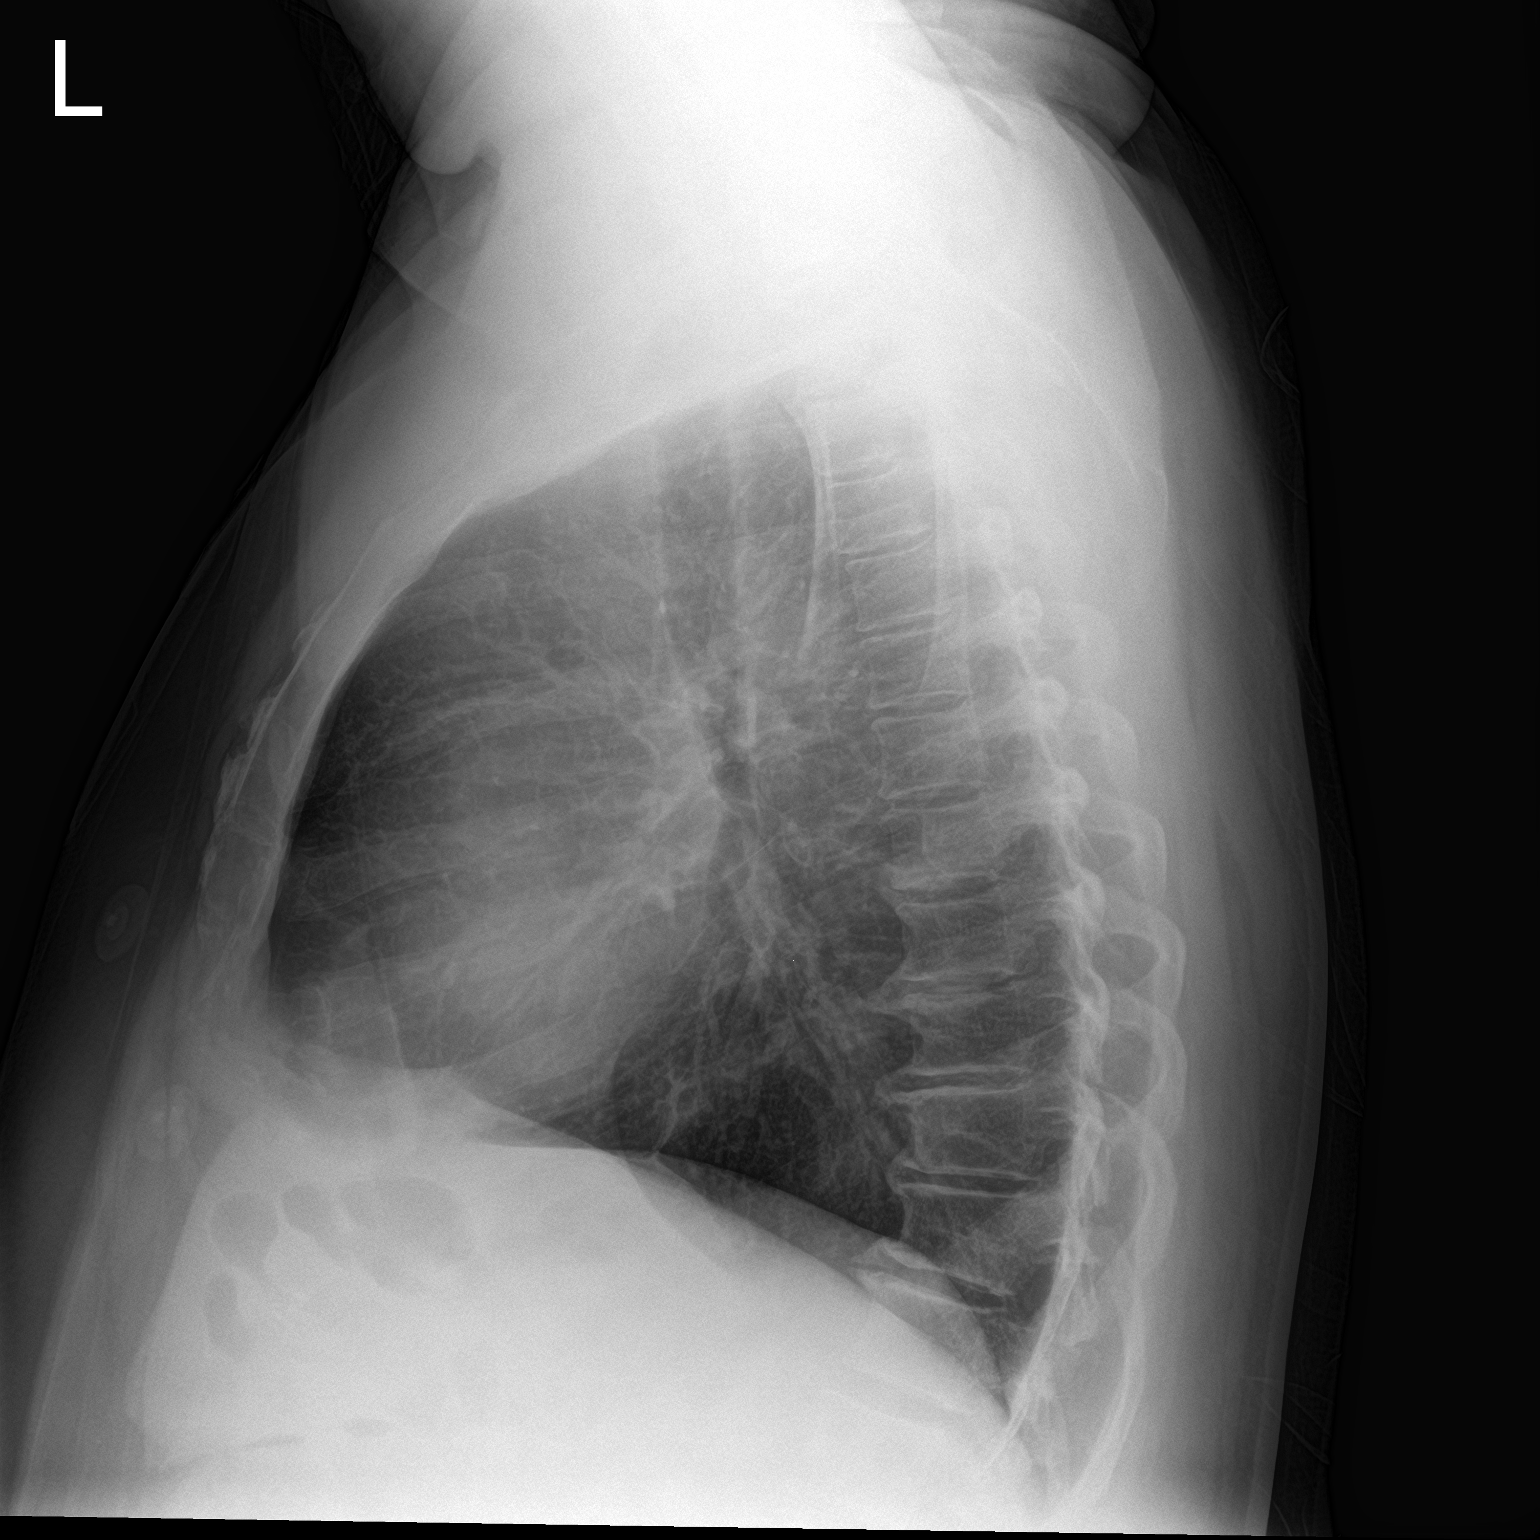

[2 of 2 positions shown; findings below may reference images not displayed]

FINDINGS: The heart size and mediastinal contours are within normal limits.
Both lungs are clear. The visualized skeletal structures are
unremarkable.
IMPRESSION: No active cardiopulmonary disease.

## 2021-08-19 DIAGNOSIS — I10 Essential (primary) hypertension: Secondary | ICD-10-CM | POA: Diagnosis not present

## 2021-08-19 DIAGNOSIS — N179 Acute kidney failure, unspecified: Secondary | ICD-10-CM | POA: Diagnosis not present

## 2021-08-19 DIAGNOSIS — N1831 Chronic kidney disease, stage 3a: Secondary | ICD-10-CM | POA: Diagnosis not present

## 2021-08-25 DIAGNOSIS — N1831 Chronic kidney disease, stage 3a: Secondary | ICD-10-CM | POA: Diagnosis not present

## 2021-08-25 DIAGNOSIS — I10 Essential (primary) hypertension: Secondary | ICD-10-CM | POA: Diagnosis not present

## 2021-09-17 DIAGNOSIS — N189 Chronic kidney disease, unspecified: Secondary | ICD-10-CM | POA: Diagnosis not present

## 2021-09-17 DIAGNOSIS — Z114 Encounter for screening for human immunodeficiency virus [HIV]: Secondary | ICD-10-CM | POA: Diagnosis not present

## 2021-09-17 DIAGNOSIS — N1832 Chronic kidney disease, stage 3b: Secondary | ICD-10-CM | POA: Diagnosis not present

## 2021-09-17 DIAGNOSIS — Z0000 Encounter for general adult medical examination without abnormal findings: Secondary | ICD-10-CM | POA: Diagnosis not present

## 2021-09-17 DIAGNOSIS — E7849 Other hyperlipidemia: Secondary | ICD-10-CM | POA: Diagnosis not present

## 2021-09-17 DIAGNOSIS — E039 Hypothyroidism, unspecified: Secondary | ICD-10-CM | POA: Diagnosis not present

## 2021-09-17 DIAGNOSIS — E785 Hyperlipidemia, unspecified: Secondary | ICD-10-CM | POA: Diagnosis not present

## 2021-09-17 DIAGNOSIS — Z113 Encounter for screening for infections with a predominantly sexual mode of transmission: Secondary | ICD-10-CM | POA: Diagnosis not present

## 2021-09-17 DIAGNOSIS — I129 Hypertensive chronic kidney disease with stage 1 through stage 4 chronic kidney disease, or unspecified chronic kidney disease: Secondary | ICD-10-CM | POA: Diagnosis not present

## 2021-09-17 DIAGNOSIS — I10 Essential (primary) hypertension: Secondary | ICD-10-CM | POA: Diagnosis not present

## 2021-09-17 DIAGNOSIS — E669 Obesity, unspecified: Secondary | ICD-10-CM | POA: Diagnosis not present

## 2021-09-17 DIAGNOSIS — Z7251 High risk heterosexual behavior: Secondary | ICD-10-CM | POA: Diagnosis not present

## 2021-10-16 DIAGNOSIS — D582 Other hemoglobinopathies: Secondary | ICD-10-CM | POA: Diagnosis not present

## 2021-10-16 DIAGNOSIS — Z789 Other specified health status: Secondary | ICD-10-CM | POA: Diagnosis not present

## 2021-10-16 DIAGNOSIS — F649 Gender identity disorder, unspecified: Secondary | ICD-10-CM | POA: Diagnosis not present

## 2021-11-23 DIAGNOSIS — Z5181 Encounter for therapeutic drug level monitoring: Secondary | ICD-10-CM | POA: Diagnosis not present

## 2021-11-23 DIAGNOSIS — E039 Hypothyroidism, unspecified: Secondary | ICD-10-CM | POA: Diagnosis not present

## 2021-11-23 DIAGNOSIS — F649 Gender identity disorder, unspecified: Secondary | ICD-10-CM | POA: Diagnosis not present

## 2021-12-21 DIAGNOSIS — M1990 Unspecified osteoarthritis, unspecified site: Secondary | ICD-10-CM | POA: Diagnosis not present

## 2021-12-21 DIAGNOSIS — N189 Chronic kidney disease, unspecified: Secondary | ICD-10-CM | POA: Diagnosis not present

## 2021-12-21 DIAGNOSIS — Z7251 High risk heterosexual behavior: Secondary | ICD-10-CM | POA: Diagnosis not present

## 2021-12-21 DIAGNOSIS — E039 Hypothyroidism, unspecified: Secondary | ICD-10-CM | POA: Diagnosis not present

## 2021-12-21 DIAGNOSIS — Z124 Encounter for screening for malignant neoplasm of cervix: Secondary | ICD-10-CM | POA: Diagnosis not present

## 2021-12-21 DIAGNOSIS — E785 Hyperlipidemia, unspecified: Secondary | ICD-10-CM | POA: Diagnosis not present

## 2021-12-21 DIAGNOSIS — Z114 Encounter for screening for human immunodeficiency virus [HIV]: Secondary | ICD-10-CM | POA: Diagnosis not present

## 2021-12-21 DIAGNOSIS — I129 Hypertensive chronic kidney disease with stage 1 through stage 4 chronic kidney disease, or unspecified chronic kidney disease: Secondary | ICD-10-CM | POA: Diagnosis not present

## 2021-12-21 DIAGNOSIS — B977 Papillomavirus as the cause of diseases classified elsewhere: Secondary | ICD-10-CM | POA: Diagnosis not present

## 2021-12-21 DIAGNOSIS — R87612 Low grade squamous intraepithelial lesion on cytologic smear of cervix (LGSIL): Secondary | ICD-10-CM | POA: Diagnosis not present

## 2022-04-15 DIAGNOSIS — J449 Chronic obstructive pulmonary disease, unspecified: Secondary | ICD-10-CM | POA: Diagnosis not present

## 2022-05-03 DIAGNOSIS — N189 Chronic kidney disease, unspecified: Secondary | ICD-10-CM | POA: Diagnosis not present

## 2022-05-03 DIAGNOSIS — E785 Hyperlipidemia, unspecified: Secondary | ICD-10-CM | POA: Diagnosis not present

## 2022-05-03 DIAGNOSIS — Z0000 Encounter for general adult medical examination without abnormal findings: Secondary | ICD-10-CM | POA: Diagnosis not present

## 2022-05-03 DIAGNOSIS — Z113 Encounter for screening for infections with a predominantly sexual mode of transmission: Secondary | ICD-10-CM | POA: Diagnosis not present

## 2022-05-03 DIAGNOSIS — Z7251 High risk heterosexual behavior: Secondary | ICD-10-CM | POA: Diagnosis not present

## 2022-05-03 DIAGNOSIS — E039 Hypothyroidism, unspecified: Secondary | ICD-10-CM | POA: Diagnosis not present

## 2022-05-03 DIAGNOSIS — Z1331 Encounter for screening for depression: Secondary | ICD-10-CM | POA: Diagnosis not present

## 2022-05-03 DIAGNOSIS — E7849 Other hyperlipidemia: Secondary | ICD-10-CM | POA: Diagnosis not present

## 2022-05-03 DIAGNOSIS — Z6834 Body mass index (BMI) 34.0-34.9, adult: Secondary | ICD-10-CM | POA: Diagnosis not present

## 2022-05-03 DIAGNOSIS — B977 Papillomavirus as the cause of diseases classified elsewhere: Secondary | ICD-10-CM | POA: Diagnosis not present

## 2022-05-03 DIAGNOSIS — E669 Obesity, unspecified: Secondary | ICD-10-CM | POA: Diagnosis not present

## 2022-05-03 DIAGNOSIS — N1832 Chronic kidney disease, stage 3b: Secondary | ICD-10-CM | POA: Diagnosis not present

## 2022-05-03 DIAGNOSIS — I10 Essential (primary) hypertension: Secondary | ICD-10-CM | POA: Diagnosis not present

## 2022-05-03 DIAGNOSIS — E6609 Other obesity due to excess calories: Secondary | ICD-10-CM | POA: Diagnosis not present

## 2022-05-03 DIAGNOSIS — Z114 Encounter for screening for human immunodeficiency virus [HIV]: Secondary | ICD-10-CM | POA: Diagnosis not present

## 2022-05-06 ENCOUNTER — Other Ambulatory Visit: Payer: Self-pay

## 2022-05-06 DIAGNOSIS — R7989 Other specified abnormal findings of blood chemistry: Secondary | ICD-10-CM

## 2022-05-12 ENCOUNTER — Ambulatory Visit
Admission: RE | Admit: 2022-05-12 | Discharge: 2022-05-12 | Disposition: A | Discharge status: Home / Self Care | Payer: Medicare HMO | Source: Ambulatory Visit | Attending: Infectious Diseases | Admitting: Infectious Diseases

## 2022-05-12 DIAGNOSIS — R945 Abnormal results of liver function studies: Secondary | ICD-10-CM | POA: Diagnosis not present

## 2022-05-12 DIAGNOSIS — R7989 Other specified abnormal findings of blood chemistry: Secondary | ICD-10-CM | POA: Diagnosis not present

## 2022-05-21 DIAGNOSIS — N1832 Chronic kidney disease, stage 3b: Secondary | ICD-10-CM | POA: Diagnosis not present

## 2022-05-21 DIAGNOSIS — N179 Acute kidney failure, unspecified: Secondary | ICD-10-CM | POA: Diagnosis not present

## 2022-05-31 DIAGNOSIS — R87612 Low grade squamous intraepithelial lesion on cytologic smear of cervix (LGSIL): Secondary | ICD-10-CM | POA: Diagnosis not present

## 2022-06-04 ENCOUNTER — Other Ambulatory Visit: Payer: Self-pay | Admitting: Obstetrics and Gynecology

## 2022-06-04 NOTE — H&P (Signed)
Destiny Wright is a 57 y.o. adult here for Pre Op Consulting (Surgical consult - phone call )   Referring provider: Self   History of Present Illness: Patient presents today to discuss his abnormal pap results. He had an abnormal pap in 2022 and had a colposcopy with me which was benign. Another pap performed 12/2021 with Dr. Sampson Goon was LSIL.   Today:    Other than above, prior abnormal paps:  Date:  06/2020       Findings: neg/hrHPV+ type 18/45 + --> colpo 10/2020 benign  Date:  12/2021     Findings: LSIL   Contraception: hysterectomy - some cervix left because attached to bowels per patient's recollection of Dr. Santina Evans discussion after surgery.    Pertinent Hx: - S/p hysterectomy  - Abnormal pap results    Past Medical History:  has a past medical history of Anxiety, Asthma, unspecified asthma severity, unspecified whether complicated, unspecified whether persistent (HHS-HCC), Diverticula of colon, Endometriosis, GERD (gastroesophageal reflux disease), Helicobacter pylori ab+ (09/2019), Hepatic steatosis, Hepatitis, History of abnormal cervical Pap smear (2022), Hyperlipidemia, Hypertension, Plantar fasciitis, bilateral, Skin cancer, Stage 3 chronic kidney disease (2020), and Thyroid disease.  Past Surgical History:  has a past surgical history that includes Knee arthroscopy; Hysterectomy; Tonsillectomy; neck surgery; Colonoscopy (01/06/1999); egd; Neck dissection; Mastectomy Bilateral Simple; and Breast biopsy. Family History: family history includes Basal cell carcinoma in his father; Diabetes in his mother; Diabetes type II in his mother; High blood pressure (Hypertension) in his father and mother; Lung disease in his father; Melanoma in his father; Prostate cancer in his father; Skin cancer in his mother; Sleep apnea in his father; Stroke in his father and mother; Thyroid disease in his father. Social History:  reports that he has been smoking cigarettes. He started smoking about  38 years ago. He has a 30 pack-year smoking history. He has never used smokeless tobacco. He reports current alcohol use. He reports that he does not use drugs. OB/GYN History:  OB History  No obstetric history on file.     Allergies: is allergic to diclofenac, molnupiravir, nsaids (non-steroidal anti-inflammatory drug), and other. Medications: Current Medications Current Outpatient Medications:    acetaminophen (TYLENOL) 650 MG ER tablet, Take 2 tablets (1,300 mg total) by mouth once daily as needed for Pain, Disp: , Rfl:    albuterol MDI, PROVENTIL, VENTOLIN, PROAIR, HFA 90 mcg/actuation inhaler, Inhale 2 inhalations into the lungs every 6 (six) hours as needed for Wheezing, Disp: 1 each, Rfl: 2   atorvastatin (LIPITOR) 20 MG tablet, TAKE 1 TABLET(20 MG) BY MOUTH EVERY DAY, Disp: 90 tablet, Rfl: 3   budesonide-glycopyrrolate-formoterol (BREZTRI AEROSPHERE) 160-9-4.8 mcg/actuation inhaler, Inhale 2 inhalations into the lungs 2 (two) times daily, Disp: 10.7 g, Rfl: 0   calcium carbonate-vitamin D3 (CALTRATE 600+D) 600 mg(1,500mg ) -400 unit tablet, Take 2 tablets by mouth once daily DOSE IS CALCIUM 650 + VIT D 500 UNITS, Disp: , Rfl:    cholecalciferol (VITAMIN D3) 2,000 unit capsule, Take 2,000 Units by mouth once daily., Disp: , Rfl:    cinnamon bark 500 mg capsule, Take 1,000 mg by mouth once daily   , Disp: , Rfl:    Compound Medication, Med Name: CBD ( vapes or tinctures as needed), Disp: , Rfl:    cranberry fruit concentrate (AZO CRANBERRY ORAL), Take by mouth, Disp: , Rfl:    docusate (COLACE) 100 MG capsule, Take 100 mg by mouth once daily, Disp: , Rfl:    emtricitabine-tenofovir, TDF, (TRUVADA) 200-300  mg tablet, Take 1 tablet by mouth once daily, Disp: 90 tablet, Rfl: 1   fenofibrate 54 MG tablet, Take 1 tablet (54 mg total) by mouth once daily, Disp: 90 tablet, Rfl: 1   fluconazole (DIFLUCAN) 200 MG tablet, Take 1 tablet (200 mg total) by mouth once a week for 180 days, Disp: 12  tablet, Rfl: 1   gabapentin (NEURONTIN) 300 MG capsule, Take 1 capsule (300 mg total) by mouth once daily, Disp: , Rfl:    HYDROcodone-acetaminophen (NORCO) 5-325 mg tablet, Take 1-2 tablets by mouth every 6 (six) hours as needed for Pain, Disp: 20 tablet, Rfl: 0   lansoprazole (PREVACID) 15 MG DR capsule, Take 15 mg by mouth once daily, Disp: , Rfl:    needle, disp, 18 G (HYPODERMIC NEEDLES) 18 gauge x 1 1/2" Ndle, Use q2 weeks as directed, Disp: 10 each, Rfl: 3   spironolactone (ALDACTONE) 25 MG tablet, Take 25 mg by mouth once daily, Disp: , Rfl:    SYNTHROID 150 mcg tablet, TAKE 1 TABLET BY MOUTH 4 DAYS A WEEK( MONDAY TO THURSDAY), Disp: 50 tablet, Rfl: 3   syringe with needle (BD LUER-LOK SYRINGE) 3 mL 25 x 5/8" Syrg, USE TO INJECT TESTOSTERONE EVERY 2 WEEKS AS DIRECTED, Disp: 10 each, Rfl: 3   testosterone cypionate (DEPO-TESTOSTERONE) 200 mg/mL injection, INJECT 0.5 MLS IN THE MUSCLE EVERY 14 DAILY, Disp: 3 mL, Rfl: 1    Review of Systems: No SOB, no palpitations or chest pain, no new lower extremity edema, no nausea or vomiting or bowel or bladder complaints. See HPI for gyn specific ROS.    Exam:   Will be performed on the morning of surgery.   Impression:   The primary encounter diagnosis was Low grade squamous intraepithelial lesion on cytologic smear of cervix (LGSIL). Diagnoses of Endometriosis and S/P abdominal supracervical subtotal hysterectomy were also pertinent to this visit.   Plan:   - Today we discussed via telephone call the patient's desire for trachelectomy. He has had multiple abnormal pap smears with a history of supracervical hysterectomy. The cervix was not removed d/t scarring and adhesion to the bowels.  - Would plan for RA Dx laparoscopy with trachelectomy, LOA and excision of endometriosis.     - I discussed likely that some of the adhesions seen in the previous surgery could be improved d/t lack of ovaries for many years now.  - We reviewed risks and  restrictions of before & after surgery    - We did encourage him to consider the option for referral to [facility].   - All of his questions were answered. Consents to be signed the morning of surgery; verbal consent today. Pt is significantly anxious in the gyn office and has requested all visits be by telephone if possible.   Diagnoses and all orders for this visit:   Low grade squamous intraepithelial lesion on cytologic smear of cervix (LGSIL)   Endometriosis   S/P abdominal supracervical subtotal hysterectomy

## 2022-06-11 DIAGNOSIS — F649 Gender identity disorder, unspecified: Secondary | ICD-10-CM | POA: Diagnosis not present

## 2022-06-11 DIAGNOSIS — R7989 Other specified abnormal findings of blood chemistry: Secondary | ICD-10-CM | POA: Diagnosis not present

## 2022-06-11 DIAGNOSIS — E039 Hypothyroidism, unspecified: Secondary | ICD-10-CM | POA: Diagnosis not present

## 2022-06-11 DIAGNOSIS — Z1159 Encounter for screening for other viral diseases: Secondary | ICD-10-CM | POA: Diagnosis not present

## 2022-06-18 ENCOUNTER — Encounter
Admission: RE | Admit: 2022-06-18 | Discharge: 2022-06-18 | Disposition: A | Discharge status: Home / Self Care | Payer: Medicare HMO | Source: Ambulatory Visit | Attending: Obstetrics and Gynecology | Admitting: Obstetrics and Gynecology

## 2022-06-18 VITALS — Ht 63.0 in | Wt 209.0 lb

## 2022-06-18 DIAGNOSIS — N1832 Chronic kidney disease, stage 3b: Secondary | ICD-10-CM

## 2022-06-18 DIAGNOSIS — Z01812 Encounter for preprocedural laboratory examination: Secondary | ICD-10-CM

## 2022-06-18 DIAGNOSIS — I10 Essential (primary) hypertension: Secondary | ICD-10-CM

## 2022-06-18 HISTORY — DX: Gender identity disorder, unspecified: F64.9

## 2022-06-18 HISTORY — DX: Unspecified mononeuropathy of left lower limb: G57.92

## 2022-06-18 HISTORY — DX: Chronic kidney disease, stage 3b: N18.32

## 2022-06-18 HISTORY — DX: Epigastric pain: R10.13

## 2022-06-18 HISTORY — DX: Chronic obstructive pulmonary disease, unspecified: J44.9

## 2022-06-18 HISTORY — DX: Unspecified asthma, uncomplicated: J45.909

## 2022-06-18 HISTORY — DX: Hypothyroidism, unspecified: E03.9

## 2022-06-18 HISTORY — DX: Inflammatory liver disease, unspecified: K75.9

## 2022-06-18 HISTORY — DX: Basal cell carcinoma of skin of unspecified upper limb, including shoulder: C44.611

## 2022-06-18 HISTORY — DX: Other specified postprocedural states: Z98.890

## 2022-06-18 HISTORY — DX: Hyperlipidemia, unspecified: E78.5

## 2022-06-18 HISTORY — DX: Nausea with vomiting, unspecified: R11.2

## 2022-06-18 HISTORY — DX: Essential (primary) hypertension: I10

## 2022-06-18 HISTORY — DX: Fatty (change of) liver, not elsewhere classified: K76.0

## 2022-06-18 HISTORY — DX: Unspecified osteoarthritis, unspecified site: M19.90

## 2022-06-18 NOTE — Patient Instructions (Addendum)
Your procedure is scheduled on: Thursday, June 13 Report to the Registration Desk on the 1st floor of the [facility] To find out your arrival time, please call [phone] between 1PM - 3PM on: Wednesday, June 12 If your arrival time is 6:00 am, do not arrive before that time as the [facility] entrance doors do not open until 6:00 am.  REMEMBER: Instructions that are not followed completely may result in serious medical risk, up to and including death; or upon the discretion of your surgeon and anesthesiologist your surgery may need to be rescheduled.  Do not eat food after midnight the night before surgery.  No gum chewing or hard candies.  You may however, drink CLEAR liquids up to 2 hours before you are scheduled to arrive for your surgery. Do not drink anything within 2 hours of your scheduled arrival time.  Clear liquids include: - water  - apple juice without pulp - gatorade (not RED colors) - black coffee or tea (Do NOT add milk or creamers to the coffee or tea) Do NOT drink anything that is not on this list.  One week prior to surgery: starting June 6 Stop Anti-inflammatories (NSAIDS) such as Advil, Aleve, Ibuprofen, Motrin, Naproxen, Naprosyn and Aspirin based products such as Excedrin, Goody's Powder, BC Powder. Stop ANY OVER THE COUNTER supplements until after surgery. Stop calcium, cinnamon, cranberry, vitamin D. You may however, continue to take Tylenol if needed for pain up until the day of surgery.  Continue taking all prescribed medications   TAKE ONLY THESE MEDICATIONS THE MORNING OF SURGERY WITH A SIP OF WATER:  Atorvastatin (Lipitor) Breztri inhaler Emtricitabine (Truvada) Fenofibrate Gabapentin Lansoprazole (Prevacid) - (take one the night before and one on the morning of surgery - helps to prevent nausea after surgery.) Levothyroxine (Synthroid)  Use inhaler on the day of surgery and bring your albuterol inhaler to the hospital.  No Alcohol for 24  hours before or after surgery.  No Smoking including e-cigarettes for 24 hours before surgery.  No chewable tobacco products for at least 6 hours before surgery.  No nicotine patches on the day of surgery.  Do not use any "recreational" drugs for at least a week (preferably 2 weeks) before your surgery.  Please be advised that the combination of cocaine and anesthesia may have negative outcomes, up to and including death. If you test positive for cocaine, your surgery will be cancelled.  On the morning of surgery brush your teeth with toothpaste and water, you may rinse your mouth with mouthwash if you wish. Do not swallow any toothpaste or mouthwash.  Use CHG Soap as directed on instruction sheet.  Do not wear jewelry, make-up, hairpins, clips or nail polish.  Do not wear lotions, powders, or perfumes.   Do not shave body hair from the neck down 48 hours before surgery.  Contact lenses, hearing aids and dentures may not be worn into surgery.  Do not bring valuables to the hospital. [facility] is not responsible for any missing/lost belongings or valuables.   Notify your doctor if there is any change in your medical condition (cold, fever, infection).  Wear comfortable clothing (specific to your surgery type) to the hospital.  After surgery, you can help prevent lung complications by doing breathing exercises.  Take deep breaths and cough every 1-2 hours. Your doctor may order a device called an Incentive Spirometer to help you take deep breaths. When coughing or sneezing, hold a pillow firmly against your incision with  both hands. This is called "splinting." Doing this helps protect your incision. It also decreases belly discomfort.  If you are being discharged the day of surgery, you will not be allowed to drive home. You will need a responsible individual to drive you home and stay with you for 24 hours after surgery.   If you are taking public transportation, you will need to  have a responsible individual with you.  Please call the Pre-admissions Testing Dept. at [phone] if you have any questions about these instructions.  Surgery Visitation Policy:  Patients having surgery or a procedure may have two visitors.  Children under the age of 68 must have an adult with them who is not the patient.     Preparing for Surgery with CHLORHEXIDINE GLUCONATE (CHG) Soap  Chlorhexidine Gluconate (CHG) Soap  o An antiseptic cleaner that kills germs and bonds with the skin to continue killing germs even after washing  o Used for showering the night before surgery and morning of surgery  Before surgery, you can play an important role by reducing the number of germs on your skin.  CHG (Chlorhexidine gluconate) soap is an antiseptic cleanser which kills germs and bonds with the skin to continue killing germs even after washing.  Please do not use if you have an allergy to CHG or antibacterial soaps. If your skin becomes reddened/irritated stop using the CHG.  1. Shower the NIGHT BEFORE SURGERY and the MORNING OF SURGERY with CHG soap.  2. If you choose to wash your hair, wash your hair first as usual with your normal shampoo.  3. After shampooing, rinse your hair and body thoroughly to remove the shampoo.  4. Use CHG as you would any other liquid soap. You can apply CHG directly to the skin and wash gently with a scrungie or a clean washcloth.  5. Apply the CHG soap to your body only from the neck down. Do not use on open wounds or open sores. Avoid contact with your eyes, ears, mouth, and genitals (private parts). Wash face and genitals (private parts) with your normal soap.  6. Wash thoroughly, paying special attention to the area where your surgery will be performed.  7. Thoroughly rinse your body with warm water.  8. Do not shower/wash with your normal soap after using and rinsing off the CHG soap.  9. Pat yourself dry with a clean towel.  10. Wear  clean pajamas to bed the night before surgery.  12. Place clean sheets on your bed the night of your first shower and do not sleep with pets.  13. Shower again with the CHG soap on the day of surgery prior to arriving at the hospital.  14. Do not apply any deodorants/lotions/powders.  15. Please wear clean clothes to the hospital.

## 2022-06-21 DIAGNOSIS — F640 Transsexualism: Secondary | ICD-10-CM | POA: Diagnosis not present

## 2022-06-21 DIAGNOSIS — Z79899 Other long term (current) drug therapy: Secondary | ICD-10-CM | POA: Diagnosis not present

## 2022-06-21 DIAGNOSIS — E039 Hypothyroidism, unspecified: Secondary | ICD-10-CM | POA: Diagnosis not present

## 2022-06-21 DIAGNOSIS — Z5181 Encounter for therapeutic drug level monitoring: Secondary | ICD-10-CM | POA: Diagnosis not present

## 2022-06-22 ENCOUNTER — Encounter
Admission: RE | Admit: 2022-06-22 | Discharge: 2022-06-22 | Disposition: A | Discharge status: Home / Self Care | Payer: Medicare HMO | Source: Ambulatory Visit | Attending: Obstetrics and Gynecology | Admitting: Obstetrics and Gynecology

## 2022-06-22 DIAGNOSIS — Z01810 Encounter for preprocedural cardiovascular examination: Secondary | ICD-10-CM | POA: Diagnosis not present

## 2022-06-22 DIAGNOSIS — N1832 Chronic kidney disease, stage 3b: Secondary | ICD-10-CM | POA: Diagnosis not present

## 2022-06-22 DIAGNOSIS — R87612 Low grade squamous intraepithelial lesion on cytologic smear of cervix (LGSIL): Secondary | ICD-10-CM

## 2022-06-22 DIAGNOSIS — Z01818 Encounter for other preprocedural examination: Secondary | ICD-10-CM | POA: Insufficient documentation

## 2022-06-22 DIAGNOSIS — I10 Essential (primary) hypertension: Secondary | ICD-10-CM

## 2022-06-22 DIAGNOSIS — Z01812 Encounter for preprocedural laboratory examination: Secondary | ICD-10-CM

## 2022-06-22 LAB — BASIC METABOLIC PANEL
Anion gap: 8 (ref 5–15)
BUN: 20 mg/dL (ref 6–20)
CO2: 24 mmol/L (ref 22–32)
Calcium: 8.9 mg/dL (ref 8.9–10.3)
Chloride: 105 mmol/L (ref 98–111)
Creatinine, Ser: 1.79 mg/dL — ABNORMAL HIGH (ref 0.44–1.00)
GFR, Estimated: 33 mL/min — ABNORMAL LOW (ref >60)
Glucose, Bld: 148 mg/dL — ABNORMAL HIGH (ref 70–99)
Potassium: 3.6 mmol/L (ref 3.5–5.1)
Sodium: 137 mmol/L (ref 135–145)

## 2022-06-22 LAB — CBC
HCT: 39.1 % (ref 36.0–46.0)
Hemoglobin: 13.2 g/dL (ref 12.0–15.0)
MCH: 34.2 pg — ABNORMAL HIGH (ref 26.0–34.0)
MCHC: 33.8 g/dL (ref 30.0–36.0)
MCV: 101.3 fL — ABNORMAL HIGH (ref 80.0–100.0)
Platelets: 276 10*3/uL (ref 150–400)
RBC: 3.86 MIL/uL — ABNORMAL LOW (ref 3.87–5.11)
RDW: 12.2 % (ref 11.5–15.5)
WBC: 4.5 10*3/uL (ref 4.0–10.5)
nRBC: 0 % (ref 0.0–0.2)

## 2022-07-01 ENCOUNTER — Encounter: Payer: Self-pay | Admitting: Obstetrics and Gynecology

## 2022-07-01 ENCOUNTER — Other Ambulatory Visit: Payer: Self-pay

## 2022-07-01 ENCOUNTER — Ambulatory Visit: Payer: Medicare HMO | Admitting: Anesthesiology

## 2022-07-01 ENCOUNTER — Ambulatory Visit: Payer: Medicare HMO | Admitting: Urgent Care

## 2022-07-01 ENCOUNTER — Encounter
Admission: RE | Disposition: A | Discharge status: Home / Self Care | Payer: Self-pay | Source: Home / Self Care | Attending: Obstetrics and Gynecology

## 2022-07-01 ENCOUNTER — Ambulatory Visit
Admission: RE | Admit: 2022-07-01 | Discharge: 2022-07-01 | Disposition: A | Discharge status: Home / Self Care | Payer: Medicare HMO | Attending: Obstetrics and Gynecology | Admitting: Obstetrics and Gynecology

## 2022-07-01 DIAGNOSIS — N736 Female pelvic peritoneal adhesions (postinfective): Secondary | ICD-10-CM | POA: Diagnosis not present

## 2022-07-01 DIAGNOSIS — N888 Other specified noninflammatory disorders of cervix uteri: Secondary | ICD-10-CM | POA: Diagnosis not present

## 2022-07-01 DIAGNOSIS — F17210 Nicotine dependence, cigarettes, uncomplicated: Secondary | ICD-10-CM | POA: Diagnosis not present

## 2022-07-01 DIAGNOSIS — E039 Hypothyroidism, unspecified: Secondary | ICD-10-CM | POA: Diagnosis not present

## 2022-07-01 DIAGNOSIS — K219 Gastro-esophageal reflux disease without esophagitis: Secondary | ICD-10-CM | POA: Diagnosis not present

## 2022-07-01 DIAGNOSIS — I10 Essential (primary) hypertension: Secondary | ICD-10-CM | POA: Insufficient documentation

## 2022-07-01 DIAGNOSIS — R87619 Unspecified abnormal cytological findings in specimens from cervix uteri: Secondary | ICD-10-CM | POA: Diagnosis not present

## 2022-07-01 DIAGNOSIS — N871 Moderate cervical dysplasia: Secondary | ICD-10-CM | POA: Diagnosis not present

## 2022-07-01 DIAGNOSIS — N881 Old laceration of cervix uteri: Secondary | ICD-10-CM | POA: Diagnosis not present

## 2022-07-01 DIAGNOSIS — J449 Chronic obstructive pulmonary disease, unspecified: Secondary | ICD-10-CM | POA: Insufficient documentation

## 2022-07-01 DIAGNOSIS — R87612 Low grade squamous intraepithelial lesion on cytologic smear of cervix (LGSIL): Secondary | ICD-10-CM | POA: Insufficient documentation

## 2022-07-01 DIAGNOSIS — Z90711 Acquired absence of uterus with remaining cervical stump: Secondary | ICD-10-CM | POA: Diagnosis not present

## 2022-07-01 DIAGNOSIS — N809 Endometriosis, unspecified: Secondary | ICD-10-CM | POA: Diagnosis not present

## 2022-07-01 HISTORY — PX: TRACHELECTOMY: SHX6586

## 2022-07-01 HISTORY — PX: LYSIS OF ADHESION: SHX5961

## 2022-07-01 SURGERY — TRACHELECTOMY
Anesthesia: General | Site: Pelvis

## 2022-07-01 MED ORDER — SODIUM CHLORIDE FLUSH 0.9 % IV SOLN
INTRAVENOUS | Status: AC
  Filled 2022-07-01: qty 30

## 2022-07-01 MED ORDER — FENTANYL CITRATE (PF) 100 MCG/2ML IJ SOLN
INTRAMUSCULAR | Status: DC | PRN
  Administered 2022-07-01: 100 ug via INTRAVENOUS

## 2022-07-01 MED ORDER — LACTATED RINGERS IV SOLN: INTRAVENOUS | Status: DC

## 2022-07-01 MED ORDER — LIDOCAINE HCL (PF) 2 % IJ SOLN
INTRAMUSCULAR | Status: AC
  Filled 2022-07-01: qty 5

## 2022-07-01 MED ORDER — PROPOFOL 1000 MG/100ML IV EMUL
INTRAVENOUS | Status: AC
  Filled 2022-07-01: qty 100

## 2022-07-01 MED ORDER — ROCURONIUM BROMIDE 10 MG/ML (PF) SYRINGE
PREFILLED_SYRINGE | INTRAVENOUS | Status: AC
  Filled 2022-07-01: qty 10

## 2022-07-01 MED ORDER — PROPOFOL 10 MG/ML IV BOLUS
INTRAVENOUS | Status: AC
  Filled 2022-07-01: qty 40

## 2022-07-01 MED ORDER — HYDROMORPHONE HCL 1 MG/ML IJ SOLN
INTRAMUSCULAR | Status: AC
  Filled 2022-07-01: qty 1

## 2022-07-01 MED ORDER — MIDAZOLAM HCL 2 MG/2ML IJ SOLN
INTRAMUSCULAR | Status: AC
  Filled 2022-07-01: qty 2

## 2022-07-01 MED ORDER — SCOPOLAMINE 1 MG/3DAYS TD PT72
MEDICATED_PATCH | TRANSDERMAL | Status: AC
  Filled 2022-07-01: qty 1

## 2022-07-01 MED ORDER — LIDOCAINE HCL (CARDIAC) PF 100 MG/5ML IV SOSY
PREFILLED_SYRINGE | INTRAVENOUS | Status: DC | PRN
  Administered 2022-07-01: 60 mg via INTRAVENOUS

## 2022-07-01 MED ORDER — HYDROCODONE-ACETAMINOPHEN 5-325 MG PO TABS: 1.0000 | ORAL_TABLET | ORAL | 0 refills | Status: AC | PRN

## 2022-07-01 MED ORDER — SCOPOLAMINE 1 MG/3DAYS TD PT72
1.0000 | MEDICATED_PATCH | TRANSDERMAL | Status: DC
  Administered 2022-07-01: 1.5 mg via TRANSDERMAL

## 2022-07-01 MED ORDER — HYDROMORPHONE HCL 1 MG/ML IJ SOLN
0.2500 mg | INTRAMUSCULAR | Status: DC | PRN
  Administered 2022-07-01 (×3): 0.5 mg via INTRAVENOUS

## 2022-07-01 MED ORDER — PROPOFOL 500 MG/50ML IV EMUL
INTRAVENOUS | Status: DC | PRN
  Administered 2022-07-01: 150 ug/kg/min via INTRAVENOUS

## 2022-07-01 MED ORDER — ORAL CARE MOUTH RINSE: 15.0000 mL | Freq: Once | OROMUCOSAL | Status: AC

## 2022-07-01 MED ORDER — PROPOFOL 10 MG/ML IV BOLUS
INTRAVENOUS | Status: AC
  Filled 2022-07-01: qty 20

## 2022-07-01 MED ORDER — ACETAMINOPHEN EXTRA STRENGTH 500 MG PO TABS: 1000.0000 mg | ORAL_TABLET | Freq: Four times a day (QID) | ORAL | 0 refills | Status: AC

## 2022-07-01 MED ORDER — ACETAMINOPHEN 500 MG PO TABS
ORAL_TABLET | ORAL | Status: AC
  Filled 2022-07-01: qty 2

## 2022-07-01 MED ORDER — ACETAMINOPHEN 500 MG PO TABS: 1000.0000 mg | ORAL_TABLET | ORAL | Status: DC

## 2022-07-01 MED ORDER — ONDANSETRON HCL 4 MG/2ML IJ SOLN
INTRAMUSCULAR | Status: AC
  Filled 2022-07-01: qty 2

## 2022-07-01 MED ORDER — BUPIVACAINE HCL (PF) 0.5 % IJ SOLN
INTRAMUSCULAR | Status: AC
  Filled 2022-07-01: qty 30

## 2022-07-01 MED ORDER — POVIDONE-IODINE 10 % EX SWAB
2.0000 | Freq: Once | CUTANEOUS | Status: AC
  Administered 2022-07-01: 2 via TOPICAL

## 2022-07-01 MED ORDER — CHLORHEXIDINE GLUCONATE 0.12 % MT SOLN
15.0000 mL | Freq: Once | OROMUCOSAL | Status: AC
  Administered 2022-07-01: 15 mL via OROMUCOSAL

## 2022-07-01 MED ORDER — HYDROMORPHONE HCL 1 MG/ML IJ SOLN
INTRAMUSCULAR | Status: DC | PRN
  Administered 2022-07-01 (×2): .5 mg via INTRAVENOUS

## 2022-07-01 MED ORDER — KETOROLAC TROMETHAMINE 0.5 % OP SOLN
1.0000 [drp] | Freq: Four times a day (QID) | OPHTHALMIC | Status: DC
  Filled 2022-07-01: qty 3

## 2022-07-01 MED ORDER — FENTANYL CITRATE (PF) 100 MCG/2ML IJ SOLN
INTRAMUSCULAR | Status: AC
  Filled 2022-07-01: qty 2

## 2022-07-01 MED ORDER — BUPIVACAINE HCL (PF) 0.5 % IJ SOLN
INTRAMUSCULAR | Status: DC | PRN
  Administered 2022-07-01: 15 mL

## 2022-07-01 MED ORDER — OXYCODONE HCL 5 MG PO TABS: 5.0000 mg | ORAL_TABLET | ORAL | 0 refills | Status: DC | PRN

## 2022-07-01 MED ORDER — KETAMINE HCL 50 MG/5ML IJ SOSY
PREFILLED_SYRINGE | INTRAMUSCULAR | Status: AC
  Filled 2022-07-01: qty 5

## 2022-07-01 MED ORDER — ACETAMINOPHEN 10 MG/ML IV SOLN
INTRAVENOUS | Status: AC
  Filled 2022-07-01: qty 100

## 2022-07-01 MED ORDER — DEXAMETHASONE SODIUM PHOSPHATE 10 MG/ML IJ SOLN
INTRAMUSCULAR | Status: DC | PRN
  Administered 2022-07-01: 10 mg via INTRAVENOUS

## 2022-07-01 MED ORDER — ACETAMINOPHEN 10 MG/ML IV SOLN
INTRAVENOUS | Status: DC | PRN
  Administered 2022-07-01: 1000 mg via INTRAVENOUS

## 2022-07-01 MED ORDER — DEXMEDETOMIDINE HCL IN NACL 80 MCG/20ML IV SOLN
INTRAVENOUS | Status: DC | PRN
  Administered 2022-07-01 (×2): 8 ug via INTRAVENOUS
  Administered 2022-07-01: 4 ug via INTRAVENOUS

## 2022-07-01 MED ORDER — SUGAMMADEX SODIUM 200 MG/2ML IV SOLN
INTRAVENOUS | Status: DC | PRN
  Administered 2022-07-01: 200 mg via INTRAVENOUS

## 2022-07-01 MED ORDER — GABAPENTIN 300 MG PO CAPS
ORAL_CAPSULE | ORAL | Status: AC
  Filled 2022-07-01: qty 1

## 2022-07-01 MED ORDER — HEMOSTATIC AGENTS (NO CHARGE) OPTIME
TOPICAL | Status: DC | PRN
  Administered 2022-07-01: 1 via TOPICAL

## 2022-07-01 MED ORDER — CEFAZOLIN SODIUM-DEXTROSE 2-4 GM/100ML-% IV SOLN
INTRAVENOUS | Status: AC
  Filled 2022-07-01: qty 100

## 2022-07-01 MED ORDER — 0.9 % SODIUM CHLORIDE (POUR BTL) OPTIME
TOPICAL | Status: DC | PRN
  Administered 2022-07-01: 500 mL

## 2022-07-01 MED ORDER — ROCURONIUM BROMIDE 100 MG/10ML IV SOLN
INTRAVENOUS | Status: DC | PRN
  Administered 2022-07-01: 60 mg via INTRAVENOUS
  Administered 2022-07-01 (×2): 20 mg via INTRAVENOUS

## 2022-07-01 MED ORDER — MIDAZOLAM HCL 2 MG/2ML IJ SOLN
INTRAMUSCULAR | Status: DC | PRN
  Administered 2022-07-01: 2 mg via INTRAVENOUS

## 2022-07-01 MED ORDER — DEXAMETHASONE SODIUM PHOSPHATE 10 MG/ML IJ SOLN
INTRAMUSCULAR | Status: AC
  Filled 2022-07-01: qty 1

## 2022-07-01 MED ORDER — PROPOFOL 10 MG/ML IV BOLUS
INTRAVENOUS | Status: DC | PRN
  Administered 2022-07-01: 140 mg via INTRAVENOUS

## 2022-07-01 MED ORDER — OXYCODONE HCL 5 MG/5ML PO SOLN: 5.0000 mg | Freq: Once | ORAL | Status: DC | PRN

## 2022-07-01 MED ORDER — ONDANSETRON HCL 4 MG/2ML IJ SOLN
INTRAMUSCULAR | Status: DC | PRN
  Administered 2022-07-01: 4 mg via INTRAVENOUS

## 2022-07-01 MED ORDER — CHLORHEXIDINE GLUCONATE 0.12 % MT SOLN
OROMUCOSAL | Status: AC
  Filled 2022-07-01: qty 15

## 2022-07-01 MED ORDER — DOCUSATE SODIUM 100 MG PO CAPS: 100.0000 mg | ORAL_CAPSULE | Freq: Two times a day (BID) | ORAL | 0 refills | Status: AC

## 2022-07-01 MED ORDER — KETAMINE HCL 10 MG/ML IJ SOLN
INTRAMUSCULAR | Status: DC | PRN
  Administered 2022-07-01: 30 mg via INTRAVENOUS
  Administered 2022-07-01: 20 mg via INTRAVENOUS

## 2022-07-01 MED ORDER — CEFAZOLIN SODIUM-DEXTROSE 2-4 GM/100ML-% IV SOLN
2.0000 g | INTRAVENOUS | Status: AC
  Administered 2022-07-01: 2 g via INTRAVENOUS

## 2022-07-01 MED ORDER — GABAPENTIN 300 MG PO CAPS: 300.0000 mg | ORAL_CAPSULE | Freq: Every day | ORAL | 0 refills | Status: AC

## 2022-07-01 MED ORDER — GABAPENTIN 300 MG PO CAPS
300.0000 mg | ORAL_CAPSULE | ORAL | Status: AC
  Administered 2022-07-01: 300 mg via ORAL

## 2022-07-01 MED ORDER — VASOPRESSIN 20 UNIT/ML IV SOLN
INTRAVENOUS | Status: AC
  Filled 2022-07-01: qty 1

## 2022-07-01 MED ORDER — OXYCODONE HCL 5 MG PO TABS: 5.0000 mg | ORAL_TABLET | Freq: Once | ORAL | Status: DC | PRN

## 2022-07-01 SURGICAL SUPPLY — 90 items
ADH SKN CLS APL DERMABOND .7 (GAUZE/BANDAGES/DRESSINGS) ×2
APL SRG 38 LTWT LNG FL B (MISCELLANEOUS) ×2
APPLICATOR ARISTA FLEXITIP XL (MISCELLANEOUS) IMPLANT
BAG DRN RND TRDRP ANRFLXCHMBR (UROLOGICAL SUPPLIES) ×2
BAG URINE DRAIN 2000ML AR STRL (UROLOGICAL SUPPLIES) ×2 IMPLANT
BLADE CLIPPER SURG (BLADE) IMPLANT
BLADE SURG SZ10 CARB STEEL (BLADE) ×2 IMPLANT
BLADE SURG SZ11 CARB STEEL (BLADE) ×2 IMPLANT
CATH FOLEY 2WAY 5CC 16FR (CATHETERS) ×2
CATH URTH 16FR FL 2W BLN LF (CATHETERS) ×2 IMPLANT
COUNTER NEEDLE 20/40 LG (NEEDLE) ×2 IMPLANT
COVER LIGHT HANDLE STERIS (MISCELLANEOUS) IMPLANT
COVER TIP SHEARS 8 DVNC (MISCELLANEOUS) ×2 IMPLANT
COVER WAND RF STERILE (DRAPES) ×2 IMPLANT
DERMABOND ADVANCED .7 DNX12 (GAUZE/BANDAGES/DRESSINGS) ×2 IMPLANT
DRAPE 3/4 80X56 (DRAPES) ×2 IMPLANT
DRAPE ARM DVNC X/XI (DISPOSABLE) ×6 IMPLANT
DRAPE COLUMN DVNC XI (DISPOSABLE) ×2 IMPLANT
DRAPE PERI LITHO V/GYN (MISCELLANEOUS) ×2 IMPLANT
DRAPE ROBOT W/ LEGGING 30X125 (DRAPES) ×2 IMPLANT
DRAPE SURG 17X11 SM STRL (DRAPES) ×2 IMPLANT
DRAPE UNDER BUTTOCK W/FLU (DRAPES) ×2 IMPLANT
ELECT REM PT RETURN 9FT ADLT (ELECTROSURGICAL) ×2
ELECTRODE REM PT RTRN 9FT ADLT (ELECTROSURGICAL) ×2 IMPLANT
FORCEPS BPLR FENES DVNC XI (FORCEP) ×2 IMPLANT
GAUZE 4X4 16PLY ~~LOC~~+RFID DBL (SPONGE) ×2 IMPLANT
GLOVE BIO SURGEON STRL SZ7 (GLOVE) ×8 IMPLANT
GLOVE INDICATOR 7.5 STRL GRN (GLOVE) ×8 IMPLANT
GOWN STRL REUS W/ TWL LRG LVL3 (GOWN DISPOSABLE) ×8 IMPLANT
GOWN STRL REUS W/ TWL XL LVL3 (GOWN DISPOSABLE) ×2 IMPLANT
GOWN STRL REUS W/TWL LRG LVL3 (GOWN DISPOSABLE) ×8
GOWN STRL REUS W/TWL XL LVL3 (GOWN DISPOSABLE) ×2
GRASPER SUT TROCAR 14GX15 (MISCELLANEOUS) ×2 IMPLANT
GRASPER TIP-UP FEN DVNC XI (INSTRUMENTS) IMPLANT
HEMOSTAT ARISTA ABSORB 3G PWDR (HEMOSTASIS) IMPLANT
IRRIGATION STRYKERFLOW (MISCELLANEOUS) IMPLANT
IRRIGATOR STRYKERFLOW (MISCELLANEOUS)
IV NS 1000ML (IV SOLUTION)
IV NS 1000ML BAXH (IV SOLUTION) IMPLANT
KIT PINK PAD W/HEAD ARE REST (MISCELLANEOUS) ×2
KIT PINK PAD W/HEAD ARM REST (MISCELLANEOUS) ×2 IMPLANT
KIT TURNOVER CYSTO (KITS) ×2 IMPLANT
LABEL OR SOLS (LABEL) ×2 IMPLANT
MANIFOLD NEPTUNE II (INSTRUMENTS) ×2 IMPLANT
MANIPULATOR UTERINE 4.5 ZUMI (MISCELLANEOUS) ×2 IMPLANT
MANIPULATOR VCARE STD CRV RETR (MISCELLANEOUS) IMPLANT
NDL DRIVE SUT CUT DVNC (INSTRUMENTS) ×2 IMPLANT
NDL HYPO 22X1.5 SAFETY MO (MISCELLANEOUS) ×2 IMPLANT
NDL SAFETY ECLIP 18X1.5 (MISCELLANEOUS) ×2 IMPLANT
NEEDLE DRIVE SUT CUT DVNC (INSTRUMENTS) ×2 IMPLANT
NEEDLE HYPO 22X1.5 SAFETY MO (MISCELLANEOUS) ×2 IMPLANT
NS IRRIG 1000ML POUR BTL (IV SOLUTION) ×2 IMPLANT
NS IRRIG 500ML POUR BTL (IV SOLUTION) ×2 IMPLANT
OBTURATOR OPTICAL STND 8 DVNC (TROCAR) ×2
OBTURATOR OPTICALSTD 8 DVNC (TROCAR) ×2 IMPLANT
OCCLUDER COLPOPNEUMO (BALLOONS) IMPLANT
PACK BASIN MINOR ARMC (MISCELLANEOUS) ×2 IMPLANT
PACK GYN LAPAROSCOPIC (MISCELLANEOUS) ×2 IMPLANT
PAD OB MATERNITY 4.3X12.25 (PERSONAL CARE ITEMS) ×2 IMPLANT
PAD PREP OB/GYN DISP 24X41 (PERSONAL CARE ITEMS) ×2 IMPLANT
SCISSORS MNPLR CVD DVNC XI (INSTRUMENTS) ×2 IMPLANT
SCRUB CHG 4% DYNA-HEX 4OZ (MISCELLANEOUS) ×2 IMPLANT
SEAL UNIV 5-12 XI (MISCELLANEOUS) ×6 IMPLANT
SEALER VESSEL EXT DVNC XI (MISCELLANEOUS) IMPLANT
SET CYSTO W/LG BORE CLAMP LF (SET/KITS/TRAYS/PACK) IMPLANT
SET TUBE SMOKE EVAC HIGH FLOW (TUBING) ×2 IMPLANT
SOL ELECTROSURG ANTI STICK (MISCELLANEOUS) ×2
SOL PREP PVP 2OZ (MISCELLANEOUS) ×2
SOL SCRUB PVP POV-IOD 4OZ 7.5% (MISCELLANEOUS) ×2
SOLUTION ELECTROSURG ANTI STCK (MISCELLANEOUS) ×2 IMPLANT
SOLUTION PREP PVP 2OZ (MISCELLANEOUS) ×2 IMPLANT
SOLUTION SCRB POV-IOD 4OZ 7.5% (MISCELLANEOUS) ×2 IMPLANT
SURGILUBE 2OZ TUBE FLIPTOP (MISCELLANEOUS) ×2 IMPLANT
SUT DVC VLOC 180 0 12IN GS21 (SUTURE) ×2
SUT MNCRL 4-0 (SUTURE) ×4
SUT MNCRL 4-0 27XMFL (SUTURE) ×4
SUT VIC AB 0 CT1 27 (SUTURE) ×2
SUT VIC AB 0 CT1 27XCR 8 STRN (SUTURE) ×4 IMPLANT
SUT VIC AB 0 CT1 36 (SUTURE) ×2 IMPLANT
SUT VIC AB 0 CT2 27 (SUTURE) ×4 IMPLANT
SUT VLOC 90 2/L VL 12 GS22 (SUTURE) IMPLANT
SUTURE DVC VLC 180 0 12IN GS21 (SUTURE) IMPLANT
SUTURE MNCRL 4-0 27XMF (SUTURE) ×2 IMPLANT
SYR 10ML LL (SYRINGE) ×2 IMPLANT
SYR 30ML LL (SYRINGE) ×2 IMPLANT
SYR 50ML LL SCALE MARK (SYRINGE) IMPLANT
SYR CONTROL 10ML LL (SYRINGE) ×2 IMPLANT
TRAP FLUID SMOKE EVACUATOR (MISCELLANEOUS) ×2 IMPLANT
WATER STERILE IRR 1000ML POUR (IV SOLUTION) ×2 IMPLANT
WATER STERILE IRR 500ML POUR (IV SOLUTION) ×2 IMPLANT

## 2022-07-01 NOTE — Transfer of Care (Signed)
Immediate Anesthesia Transfer of Care Note  Patient: Destiny Wright  Procedure(s) Performed: ROBOTIC ASSISTED TRACHELECTOMY (Pelvis) LYSIS OF ADHESION XI ROBOTIC ASSISTED DIAGNOSTIC LAPAROSCOPY  Patient Location: PACU  Anesthesia Type:General  Level of Consciousness: drowsy  Airway & Oxygen Therapy: Patient Spontanous Breathing and Patient connected to face mask oxygen  Post-op Assessment: Report given to RN, Post -op Vital signs reviewed and stable, and Patient moving all extremities  Post vital signs: Reviewed and stable  Last Vitals:  Vitals Value Taken Time  BP 158/101 07/01/22 1030  Temp 36.6 C 07/01/22 1023  Pulse 73 07/01/22 1035  Resp 19 07/01/22 1035  SpO2 100 % 07/01/22 1035  Vitals shown include unvalidated device data.  Last Pain:  Vitals:   07/01/22 1023  TempSrc:   PainSc: Asleep         Complications: No notable events documented.

## 2022-07-01 NOTE — Anesthesia Preprocedure Evaluation (Signed)
Anesthesia Evaluation  Patient identified by MRN, date of birth, ID band Patient awake    Reviewed: Allergy & Precautions, NPO status , Patient's Chart, lab work & pertinent test results  History of Anesthesia Complications (+) PONV and history of anesthetic complications  Airway Mallampati: III  TM Distance: >3 FB Neck ROM: full    Dental  (+) Chipped   Pulmonary asthma , COPD,  COPD inhaler, former smoker   Pulmonary exam normal        Cardiovascular hypertension, On Medications Normal cardiovascular exam+ Valvular Problems/Murmurs      Neuro/Psych  PSYCHIATRIC DISORDERS Anxiety Depression     Neuromuscular disease    GI/Hepatic Neg liver ROS,GERD  Controlled and Medicated,,  Endo/Other  Hypothyroidism    Renal/GU Renal disease     Musculoskeletal  (+) Arthritis ,    Abdominal   Peds  Hematology negative hematology ROS (+)   Anesthesia Other Findings Past Medical History: No date: Anxiety No date: Asthma No date: Basal cell carcinoma, arm     Comment:  (bilateral forearm) No date: COPD (chronic obstructive pulmonary disease) (HCC) No date: Depression No date: Dyspepsia No date: Endometriosis No date: Gender dysphoria No date: GERD (gastroesophageal reflux disease) 2018: Heart murmur     Comment:  echocardiogram No date: Hepatic steatosis No date: Hepatitis 2021: History of Helicobacter pylori infection No date: Hyperlipidemia No date: Hypertension No date: Hypothyroidism No date: Neuropathy of left foot No date: Osteoarthritis No date: PONV (postoperative nausea and vomiting) 2008: PVC (premature ventricular contraction)     Comment:  echocardiogram No date: Stage 3b chronic kidney disease (CKD) (HCC) No date: Vitamin D deficiency  Past Surgical History: 2014: ANTERIOR CERVICAL DECOMP/DISCECTOMY FUSION 2012: BREAST BIOPSY; Right 2000: COLONOSCOPY 10/16/2019: ESOPHAGOGASTRODUODENOSCOPY (EGD)  WITH PROPOFOL; N/A     Comment:  Procedure: ESOPHAGOGASTRODUODENOSCOPY (EGD) WITH               PROPOFOL;  Surgeon: Toney Reil, MD;  Location:               [facility] ENDOSCOPY;  Service: Gastroenterology;  Laterality:               N/A; 2015: HAMMER TOE SURGERY; Left 2004: KNEE ARTHROSCOPY; Right     Comment:  arthritis 2000: LAPAROSCOPY     Comment:  endometriosis 2009: NASAL SEPTUM SURGERY 2017: SIMPLE MASTECTOMY; Bilateral     Comment:  with nipple graft 2009: TONSILLECTOMY 2006: TOTAL ABDOMINAL HYSTERECTOMY W/ BILATERAL SALPINGOOPHORECTOMY 2003: UPPER GI ENDOSCOPY  BMI    Body Mass Index: 37.02 kg/m      Reproductive/Obstetrics negative OB ROS                             Anesthesia Physical Anesthesia Plan  ASA: 2  Anesthesia Plan: General ETT   Post-op Pain Management: Tylenol PO (pre-op)*, Gabapentin PO (pre-op)*, Toradol IV (intra-op)*, Dilaudid IV and Ketamine IV*   Induction: Intravenous  PONV Risk Score and Plan: 4 or greater and Ondansetron, Dexamethasone, Midazolam, Treatment may vary due to age or medical condition and Scopolamine patch - Pre-op  Airway Management Planned: Oral ETT  Additional Equipment:   Intra-op Plan:   Post-operative Plan: Extubation in OR  Informed Consent: I have reviewed the patients History and Physical, chart, labs and discussed the procedure including the risks, benefits and alternatives for the proposed anesthesia with the patient or authorized representative who has indicated his/her understanding and acceptance.  Dental Advisory Given  Plan Discussed with: Anesthesiologist, CRNA and Surgeon  Anesthesia Plan Comments: (Patient consented for risks of anesthesia including but not limited to:  - adverse reactions to medications - damage to eyes, teeth, lips or other oral mucosa - nerve damage due to positioning  - sore throat or hoarseness - Damage to heart, brain, nerves, lungs, other  parts of body or loss of life  Patient voiced understanding.)        Anesthesia Quick Evaluation

## 2022-07-01 NOTE — Op Note (Signed)
Destiny Wright PROCEDURE DATE: 07/01/2022  PREOPERATIVE DIAGNOSIS:   Abnormal pap smear  POSTOPERATIVE DIAGNOSIS:   Abnormal pap smear  SURGEON:   Christeen Douglas, M.D. ASSISTANT: Thomasene Mohair, M.D. OPERATION:  Robotically-assisted laparoscopic lysis of adhesions, laparoscopic trachelectomy  ANESTHESIA:  General endotracheal. Anesthesiologist: Anesthesiologist: Louie Boston, MD CRNA: Hezzie Bump, CRNA; Johney Maine D, CRNA  INDICATIONS: The patient is a 57 y.o. M with history of endometriosis, prior open supracervical hysterectomy with adhesions. The patient made a decision to undergo definite surgical treatment. On the preoperative visit, the risks, benefits, indications, and alternatives of the procedure were reviewed with the patient.  On the day of surgery, the risks of surgery were again discussed with the patient including but not limited to: bleeding which may require transfusion or reoperation; infection which may require antibiotics; injury to bowel, bladder, ureters or other surrounding organs; need for additional procedures; thromboembolic phenomenon, incisional problems and other postoperative/anesthesia complications. Written informed consent was obtained.    OPERATIVE FINDINGS: Bowels lightly adherent to the apex of the cervix. Normal cervix externally. Normal bladder. Significant bowel and omental adhesions to the right of the umbilicus at the anterior abdominal wall.  ESTIMATED BLOOD LOSS: 20 ml FLUIDS:  800 ml of Lactated Ringers URINE OUTPUT:  200 ml of clear yellow urine. SPECIMENS:  cervix sent to pathology COMPLICATIONS:  None immediate.  DESCRIPTION OF PROCEDURE:  The patient received prophylactic intravenous antibiotics and had sequential compression devices applied to her lower extremities while in the preoperative area.    He was taken to the operating room, where he was identified by name and birth date. General anesthesia was administered and was  found to be adequate.  He was placed in the dorsal lithotomy position, and was prepped and draped in a sterile manner.  A formal time out procedure was performed with all team members present and in agreement. A Foley catheter was inserted into his bladder and attached to gravity drainage. Of note, all sutures used in this case were 0 Vicryl unless otherwise noted.    Attention was turned to the abdomen where an umbilical incision was made with the scalpel.  The Optiview 8-mm trocar and sleeve were then advanced without difficulty with the laparoscope under direct visualization into the abdomen.  The abdomen was then insufflated with carbon dioxide gas and adequate pneumoperitoneum was obtained.   A detailed survey of the patient's pelvis and abdomen revealed the findings as mentioned above. Three additional 8mm trocars were placed laterally under direct visualization.  The abdomen and pelvis were examined as above.  The abdominal adhesions were left in situ.The pelvic bowel adhesions were taken down using sharp cautery. The bladder was backfilled with of sterile saline.  The bladder flap was created, the later uterine arteries were identified and skeletonized, and the colpotomy was created with the scissors. Once the cervix was free, it was then delivered via the posterior cul-de-sac and sent to pathology.  After completion of the trachelectomy, all pedicles were examined and hemostasis was confirmed.  The vaginal cuff was then closed with in a running locked fashion with care given to incorporate the uterosacral pedicles bilaterally, and imbricated to include the safe peritoneum. The posterior rectosigmoid was noted to the lateral and below the cuff. All instruments were then removed from the pelvis.  Attention was returned to the abdomen, and hemostasis was assured. All incisions were closed with 4-0 Vicryl and Dermabond.   The patient tolerated the procedure well.  All  instruments, needles, and  sponge counts were correct x 2. The patient was taken to the recovery room in stable condition.    Cline Cools, MD, MPH

## 2022-07-01 NOTE — Interval H&P Note (Signed)
History and Physical Interval Note:  07/01/2022 7:18 AM  Destiny Wright  has presented today for surgery, with the diagnosis of abnormal pap smear, endometriosis.  The various methods of treatment have been discussed with the patient and family. After consideration of risks, benefits and other options for treatment, the patient has consented to  Procedure(s): ROBOTIC ASSISTED TRACHELECTOMY (N/A) LYSIS OF ADHESION, EXCISION OF ENDOMETRIOSIS (N/A) XI ROBOTIC ASSISTED DIAGNOSTIC LAPAROSCOPY (N/A) as a surgical intervention.  The patient's history has been reviewed, patient examined, no change in status, stable for surgery.  I have reviewed the patient's chart and labs.  Questions were answered to the patient's satisfaction.     Christeen Douglas

## 2022-07-01 NOTE — Anesthesia Procedure Notes (Signed)
Procedure Name: Intubation Date/Time: 07/01/2022 7:47 AM  Performed by: Katherine Basset, CRNAPre-anesthesia Checklist: Patient identified, Emergency Drugs available, Suction available and Patient being monitored Patient Re-evaluated:Patient Re-evaluated prior to induction Oxygen Delivery Method: Circle system utilized Preoxygenation: Pre-oxygenation with 100% oxygen Induction Type: IV induction Ventilation: Mask ventilation without difficulty and Oral airway inserted - appropriate to patient size Laryngoscope Size: Hyacinth Meeker and 2 Grade View: Grade II Tube type: Oral Tube size: 7.0 mm Number of attempts: 1 Airway Equipment and Method: Stylet, Oral airway and Bite block Placement Confirmation: ETT inserted through vocal cords under direct vision, positive ETCO2 and breath sounds checked- equal and bilateral Secured at: 21 cm Tube secured with: Tape Dental Injury: Teeth and Oropharynx as per pre-operative assessment

## 2022-07-01 NOTE — Anesthesia Postprocedure Evaluation (Signed)
Anesthesia Post Note  Patient: Destiny Wright  Procedure(s) Performed: ROBOTIC ASSISTED TRACHELECTOMY (Pelvis) LYSIS OF ADHESION XI ROBOTIC ASSISTED DIAGNOSTIC LAPAROSCOPY  Patient location during evaluation: PACU Anesthesia Type: General Level of consciousness: awake and alert Pain management: pain level controlled Vital Signs Assessment: post-procedure vital signs reviewed and stable Respiratory status: spontaneous breathing, nonlabored ventilation, respiratory function stable and patient connected to nasal cannula oxygen Cardiovascular status: blood pressure returned to baseline and stable Postop Assessment: no apparent nausea or vomiting Anesthetic complications: no   No notable events documented.   Last Vitals:  Vitals:   07/01/22 1115 07/01/22 1130  BP: 137/77 137/80  Pulse: 70 64  Resp: 13 (!) 8  Temp:    SpO2: 96% 96%    Last Pain:  Vitals:   07/01/22 1115  TempSrc:   PainSc: Asleep                 Louie Boston

## 2022-07-01 NOTE — Discharge Instructions (Addendum)
For the next three days, take ibuprofen (unless you have an allergy to it) and acetaminophen on a schedule, every 8 hours. You can take them together or you can intersperse them, and take one every four hours. I also gave you gabapentin for nighttime, to help you sleep and also to control pain. Take gabapentin medicines at night for at least the next 3 nights. You also have a narcotic, oxycodone, to take as needed if the above medicines don't help. Continue on your home pain meds as the baseline for what you need.  Postop constipation is a major cause of pain. Stay well hydrated, walk as you tolerate, and take over the counter senna as well as stool softeners if you need them.  You will likely have vaginal discharge and spotting over the next week. In about 6-8 weeks you may have another week of vaginal discharge as the sutures dissolve at the top of the vaginal where the cervix was previously.  Signs and Symptoms to Report Call our office at [phone] if you have any of the following.   Fever over 100.4 degrees or higher  Severe stomach pain not relieved with pain medications  Bright red bleeding that's heavier than a period that does not slow with rest  To go the bathroom a lot (frequency), you can't hold your urine (urgency), or it hurts when you empty your bladder (urinate)  Chest pain  Shortness of breath  Pain in the calves of your legs  Severe nausea and vomiting not relieved with anti-nausea medications  Signs of infection around your wounds, such as redness, hot to touch, swelling, green/yellow drainage (like pus), bad smelling discharge  Any concerns  What You Can Expect after Surgery  You may see some pink tinged, bloody fluid and bruising around the wound. This is normal.  You may notice shoulder and neck pain. This is caused by the gas used during surgery to expand your abdomen so your surgeon could get to the pelvis easier.  You may have a sore throat because of the tube  in your mouth during general anesthesia. This will go away in 2 to 3 days.  You may have some stomach cramps.  You may notice spotting on your underwear or pad.  You may have pain around the incision sites.   Activities after Your Discharge Follow these guidelines to help speed your recovery at home:  Do the coughing and deep breathing as you did in the hospital for 2 weeks. Use the small blue breathing device, called the incentive spirometer for 2 weeks.  Don't drive if you are in pain or taking heavy narcotic pain medicine. You may drive when you can safely slam on the brakes, turn the wheel forcefully, and rotate your torso comfortably. This is typically 1-2 weeks. Practice in a parking lot or side street prior to attempting to drive regularly.   Ask others to help with household chores for 4 weeks.  Do not lift anything heavier that 10 pounds for 4-6 weeks. This includes pets, children, and groceries.  Don't do strenuous activities, exercises, or sports like vacuuming, tennis, squash, etc. until your doctor says it is safe to do so. ---Do not place anything in the vagina or rectum for 8 weeks.   Walk as you feel able. Rest often since it may take two or three weeks for your energy level to return to normal.   You may climb stairs  Avoid constipation:   -Eat fruits, vegetables, and  whole grains. Eat small meals as your appetite will take time to return to normal.   -Drink 6 to 8 glasses of water each day unless your doctor has told you to limit your fluids.   -Use a laxative or stool softener as needed if constipation becomes a problem. You may take Miralax, metamucil, Citrucil, Colace, Senekot, FiberCon, etc. If this does not relieve the constipation, try two tablespoons of Milk Of Magnesia every 8 hours until your bowels move.   You may shower. Gently wash the wounds with a mild soap and water. Pat dry.  Do not get in a hot tub, swimming pool, etc. until your doctor agrees.  Do not use  lotions, oils, powders on the wounds.  Do not douche, use tampons, or have sex until your doctor says it is okay.  Take your pain medicine when you need it. The medicine may not work as well if the pain is bad.  Take the medicines you were taking before surgery. Other medications you will need are pain medications (Norco or Percocet) and nausea medications (Zofran). AMBULATORY SURGERY  DISCHARGE INSTRUCTIONS   The drugs that you were given will stay in your system until tomorrow so for the next 24 hours you should not:  Drive an automobile Make any legal decisions Drink any alcoholic beverage   You may resume regular meals tomorrow.  Today it is better to start with liquids and gradually work up to solid foods.  You may eat anything you prefer, but it is better to start with liquids, then soup and crackers, and gradually work up to solid foods.   Please notify your doctor immediately if you have any unusual bleeding, trouble breathing, redness and pain at the surgery site, drainage, fever, or pain not relieved by medication.             Eye abrasion Please apply 1 drop to eye every 6 hours as needed.  If pain persist over 24 hours, please contact an ophthalmologist as directed by anesthesiologist.

## 2022-07-02 ENCOUNTER — Encounter: Payer: Self-pay | Admitting: Obstetrics and Gynecology

## 2022-07-19 DIAGNOSIS — N179 Acute kidney failure, unspecified: Secondary | ICD-10-CM | POA: Diagnosis not present

## 2022-07-19 DIAGNOSIS — N1832 Chronic kidney disease, stage 3b: Secondary | ICD-10-CM | POA: Diagnosis not present

## 2022-07-26 DIAGNOSIS — N1832 Chronic kidney disease, stage 3b: Secondary | ICD-10-CM | POA: Diagnosis not present

## 2022-07-26 DIAGNOSIS — I10 Essential (primary) hypertension: Secondary | ICD-10-CM | POA: Diagnosis not present

## 2022-07-28 DIAGNOSIS — R87612 Low grade squamous intraepithelial lesion on cytologic smear of cervix (LGSIL): Secondary | ICD-10-CM | POA: Diagnosis not present

## 2022-07-28 DIAGNOSIS — Z09 Encounter for follow-up examination after completed treatment for conditions other than malignant neoplasm: Secondary | ICD-10-CM | POA: Diagnosis not present

## 2022-07-28 DIAGNOSIS — N809 Endometriosis, unspecified: Secondary | ICD-10-CM | POA: Diagnosis not present

## 2022-08-11 DIAGNOSIS — N1832 Chronic kidney disease, stage 3b: Secondary | ICD-10-CM | POA: Diagnosis not present

## 2022-08-11 DIAGNOSIS — I10 Essential (primary) hypertension: Secondary | ICD-10-CM | POA: Diagnosis not present

## 2022-08-12 DIAGNOSIS — I10 Essential (primary) hypertension: Secondary | ICD-10-CM | POA: Diagnosis not present

## 2022-08-12 DIAGNOSIS — N1832 Chronic kidney disease, stage 3b: Secondary | ICD-10-CM | POA: Diagnosis not present

## 2022-08-23 DIAGNOSIS — N189 Chronic kidney disease, unspecified: Secondary | ICD-10-CM | POA: Diagnosis not present

## 2022-08-23 DIAGNOSIS — M1990 Unspecified osteoarthritis, unspecified site: Secondary | ICD-10-CM | POA: Diagnosis not present

## 2022-08-23 DIAGNOSIS — N1832 Chronic kidney disease, stage 3b: Secondary | ICD-10-CM | POA: Diagnosis not present

## 2022-08-23 DIAGNOSIS — E7849 Other hyperlipidemia: Secondary | ICD-10-CM | POA: Diagnosis not present

## 2022-08-23 DIAGNOSIS — B977 Papillomavirus as the cause of diseases classified elsewhere: Secondary | ICD-10-CM | POA: Diagnosis not present

## 2022-08-23 DIAGNOSIS — E6609 Other obesity due to excess calories: Secondary | ICD-10-CM | POA: Diagnosis not present

## 2022-08-23 DIAGNOSIS — E785 Hyperlipidemia, unspecified: Secondary | ICD-10-CM | POA: Diagnosis not present

## 2022-08-23 DIAGNOSIS — Z6834 Body mass index (BMI) 34.0-34.9, adult: Secondary | ICD-10-CM | POA: Diagnosis not present

## 2022-08-23 DIAGNOSIS — E039 Hypothyroidism, unspecified: Secondary | ICD-10-CM | POA: Diagnosis not present

## 2022-08-23 DIAGNOSIS — Z114 Encounter for screening for human immunodeficiency virus [HIV]: Secondary | ICD-10-CM | POA: Diagnosis not present

## 2022-08-23 DIAGNOSIS — I10 Essential (primary) hypertension: Secondary | ICD-10-CM | POA: Diagnosis not present

## 2022-08-23 DIAGNOSIS — E669 Obesity, unspecified: Secondary | ICD-10-CM | POA: Diagnosis not present

## 2022-08-23 DIAGNOSIS — I129 Hypertensive chronic kidney disease with stage 1 through stage 4 chronic kidney disease, or unspecified chronic kidney disease: Secondary | ICD-10-CM | POA: Diagnosis not present

## 2022-08-23 DIAGNOSIS — Z7251 High risk heterosexual behavior: Secondary | ICD-10-CM | POA: Diagnosis not present

## 2022-09-05 IMAGING — CT CT ABD-PELV W/O CM
2 of 4 series · 17 of 46 positions shown, 19 images · non-contrast
Comparison: 09/26/2019

CLINICAL DATA: Left lower abdominal pain

EXAM:
CT ABDOMEN AND PELVIS WITHOUT CONTRAST
TECHNIQUE: Multidetector CT imaging of the abdomen and pelvis was performed
following the standard protocol without IV contrast.

[Series 2: routine abd/pel wo · axial · 0.83mm/px · z∈[-1072,-667]mm · 14 of 89 slices shown, 16 images]
[im 4/89  soft-tissue]
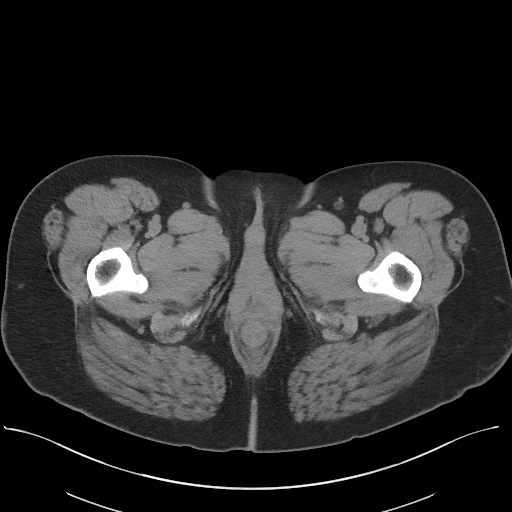
[im 4/89  bone]
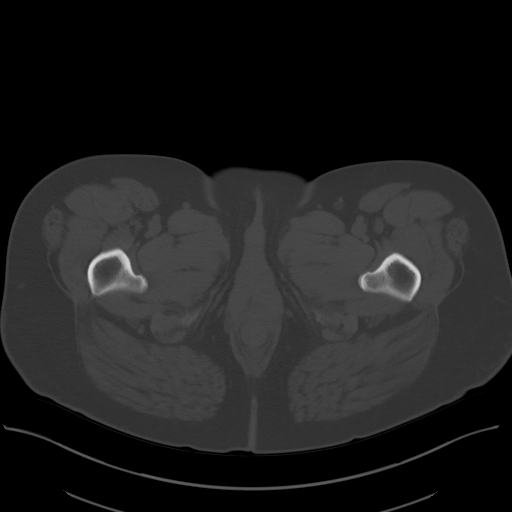
[im 12/89  soft-tissue]
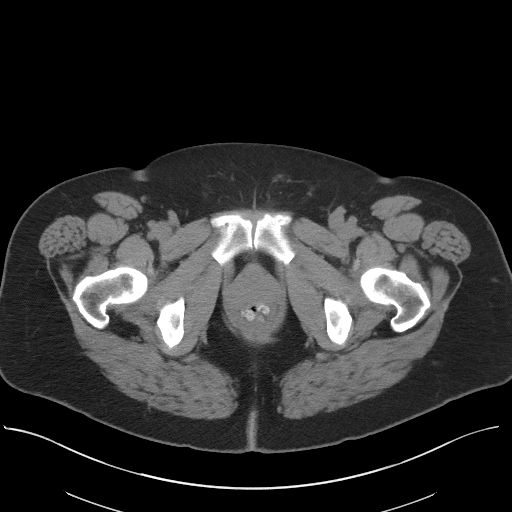
[im 19/89  soft-tissue]
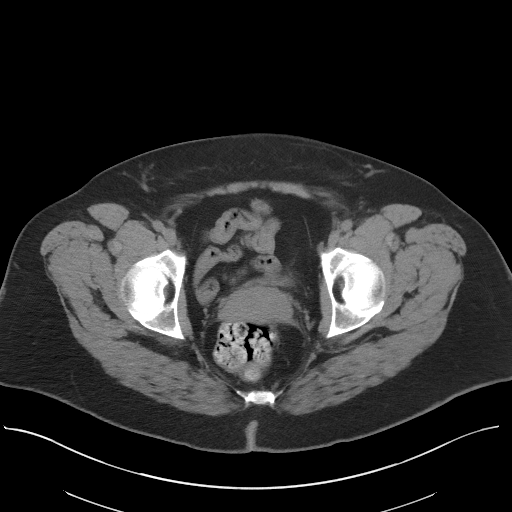
[im 23/89  soft-tissue]
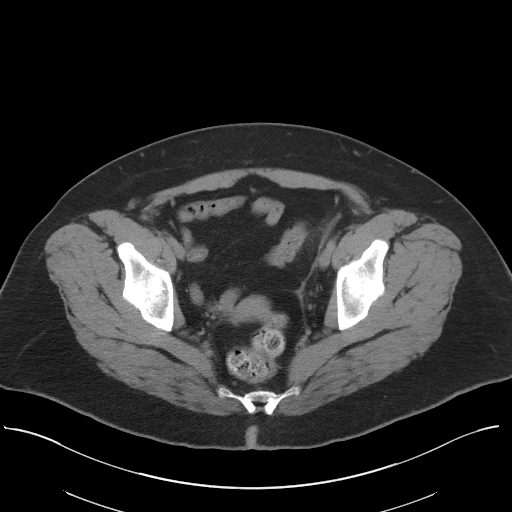
[im 30/89  soft-tissue]
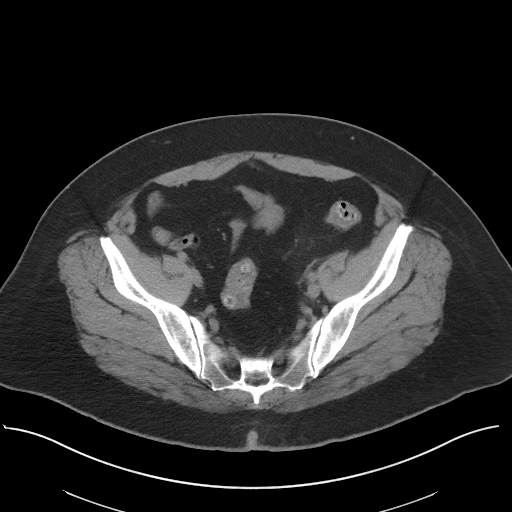
[im 37/89  soft-tissue]
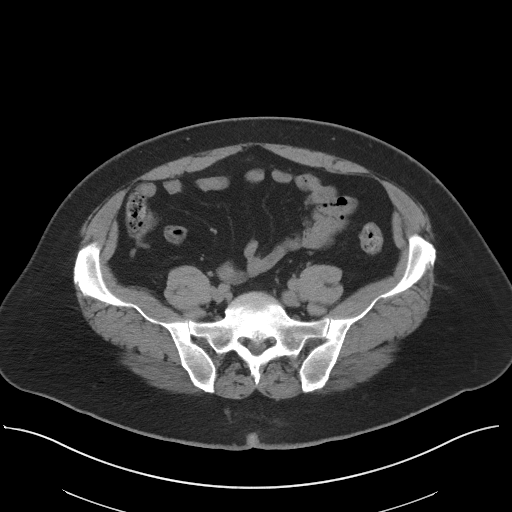
[im 41/89  soft-tissue]
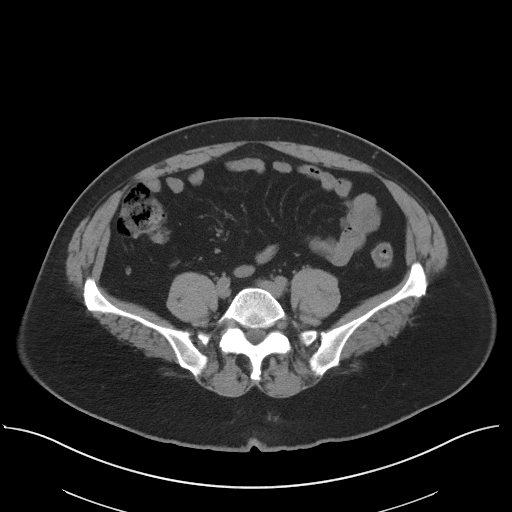
[im 48/89  soft-tissue]
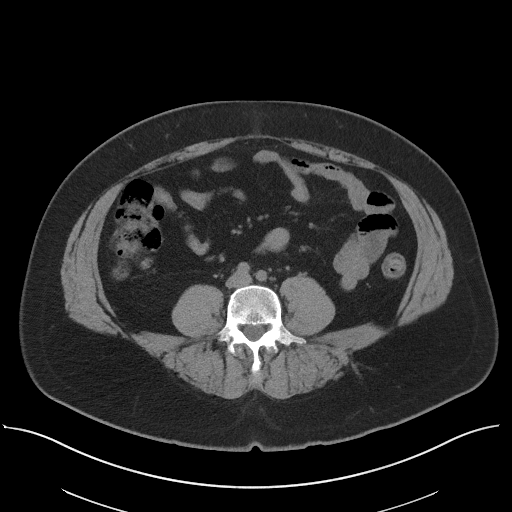
[im 52/89  soft-tissue]
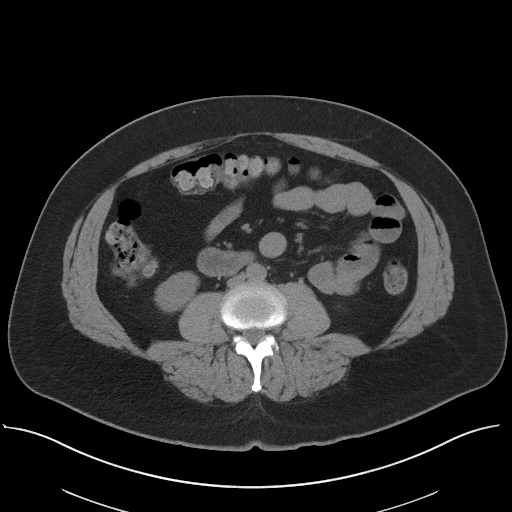
[im 52/89  bone]
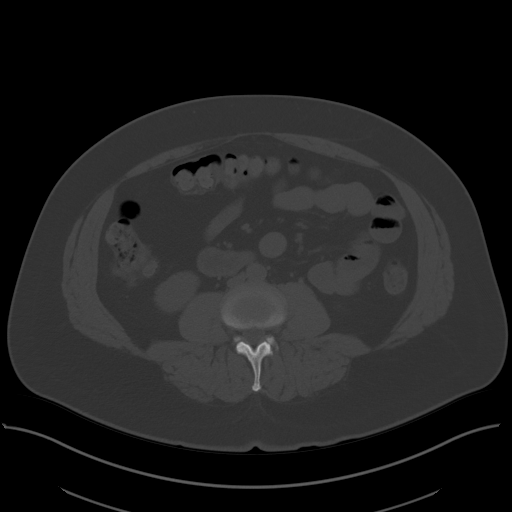
[im 59/89  soft-tissue]
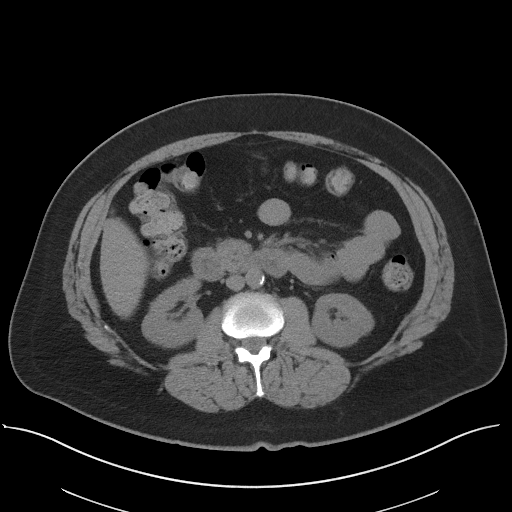
[im 67/89  soft-tissue]
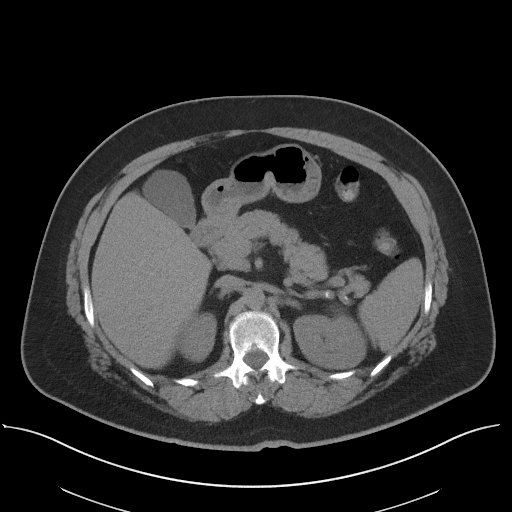
[im 70/89  soft-tissue]
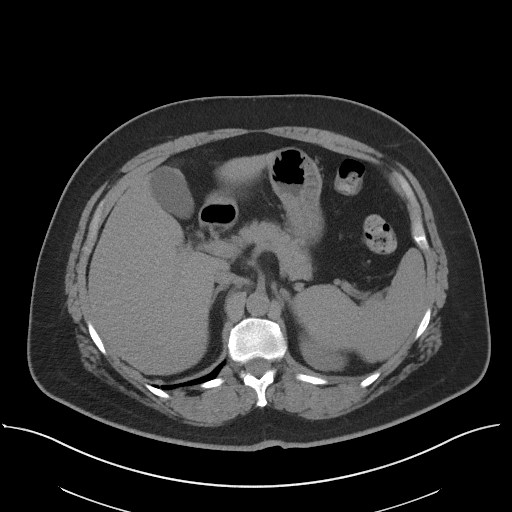
[im 78/89  soft-tissue]
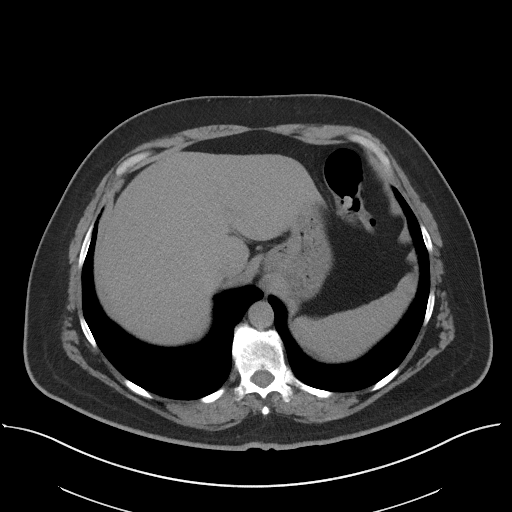
[im 85/89  soft-tissue]
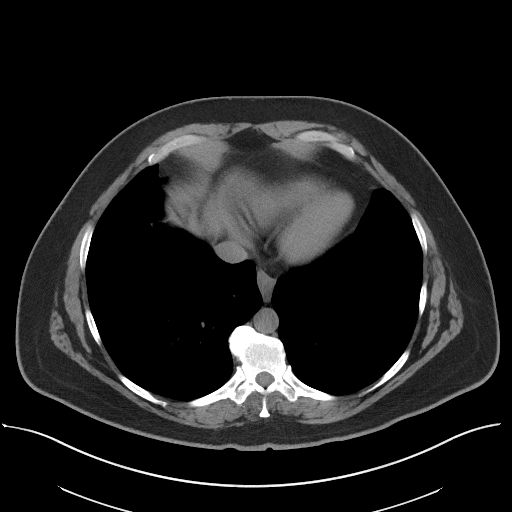

[Series 5: coronal st · coronal · 0.82mm/px · 3 of 100 slices shown]
[im 34/100  soft-tissue]
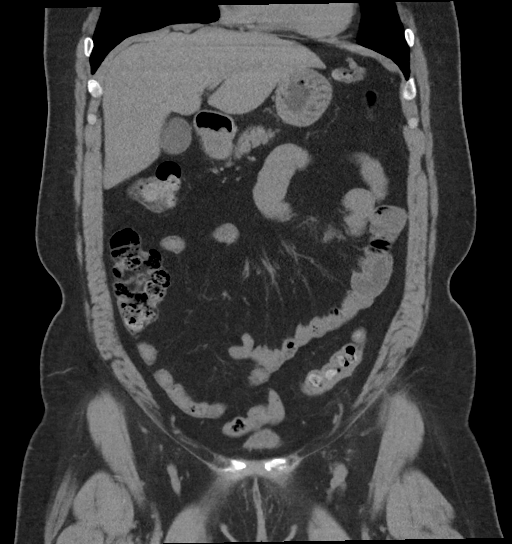
[im 45/100  soft-tissue]
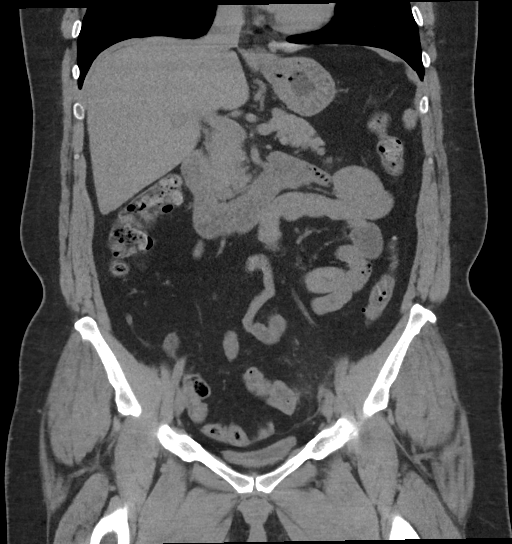
[im 56/100  soft-tissue]
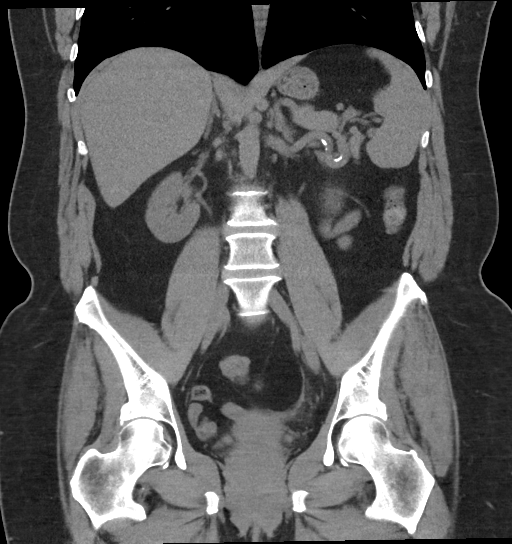

[17 of 46 positions shown; findings below may reference images not displayed]

FINDINGS: Lower chest: Stable mild scarring in the posterior basal segment
right lower lobe.

Hepatobiliary: Unremarkable

Pancreas: Unremarkable

Spleen: Unremarkable

Adrenals/Urinary Tract: Unremarkable

Stomach/Bowel: Scattered sigmoid colon diverticula with mild
inflammatory stranding along diverticula in the proximal sigmoid
colon on images 40 through 46 of series 5 most compatible with mild
acute diverticulitis. No extraluminal gas or abscess identified.

Vascular/Lymphatic: Mild abdominal aortic atherosclerotic
calcification. No pathologic adenopathy is identified.

Reproductive: Unremarkable

Other: No supplemental non-categorized findings.

Musculoskeletal: Unremarkable
IMPRESSION: 1. Mild acute diverticulitis along the proximal sigmoid colon. No
extraluminal gas or abscess.
2.  Aortic Atherosclerosis ([id number]).

## 2022-09-13 DIAGNOSIS — N1832 Chronic kidney disease, stage 3b: Secondary | ICD-10-CM | POA: Diagnosis not present

## 2022-09-13 DIAGNOSIS — K760 Fatty (change of) liver, not elsewhere classified: Secondary | ICD-10-CM | POA: Diagnosis not present

## 2022-09-13 DIAGNOSIS — R7989 Other specified abnormal findings of blood chemistry: Secondary | ICD-10-CM | POA: Diagnosis not present

## 2022-12-20 DIAGNOSIS — E66811 Obesity, class 1: Secondary | ICD-10-CM | POA: Diagnosis not present

## 2022-12-20 DIAGNOSIS — E7849 Other hyperlipidemia: Secondary | ICD-10-CM | POA: Diagnosis not present

## 2022-12-20 DIAGNOSIS — E039 Hypothyroidism, unspecified: Secondary | ICD-10-CM | POA: Diagnosis not present

## 2022-12-20 DIAGNOSIS — M79672 Pain in left foot: Secondary | ICD-10-CM | POA: Diagnosis not present

## 2022-12-20 DIAGNOSIS — Z6835 Body mass index (BMI) 35.0-35.9, adult: Secondary | ICD-10-CM | POA: Diagnosis not present

## 2022-12-20 DIAGNOSIS — M7989 Other specified soft tissue disorders: Secondary | ICD-10-CM | POA: Diagnosis not present

## 2022-12-20 DIAGNOSIS — I129 Hypertensive chronic kidney disease with stage 1 through stage 4 chronic kidney disease, or unspecified chronic kidney disease: Secondary | ICD-10-CM | POA: Diagnosis not present

## 2022-12-20 DIAGNOSIS — M79675 Pain in left toe(s): Secondary | ICD-10-CM | POA: Diagnosis not present

## 2022-12-20 DIAGNOSIS — N189 Chronic kidney disease, unspecified: Secondary | ICD-10-CM | POA: Diagnosis not present

## 2022-12-20 DIAGNOSIS — Z7251 High risk heterosexual behavior: Secondary | ICD-10-CM | POA: Diagnosis not present

## 2022-12-20 DIAGNOSIS — E669 Obesity, unspecified: Secondary | ICD-10-CM | POA: Diagnosis not present

## 2022-12-24 DIAGNOSIS — E039 Hypothyroidism, unspecified: Secondary | ICD-10-CM | POA: Diagnosis not present

## 2022-12-24 DIAGNOSIS — N1832 Chronic kidney disease, stage 3b: Secondary | ICD-10-CM | POA: Diagnosis not present

## 2022-12-24 DIAGNOSIS — F640 Transsexualism: Secondary | ICD-10-CM | POA: Diagnosis not present

## 2022-12-24 DIAGNOSIS — Z114 Encounter for screening for human immunodeficiency virus [HIV]: Secondary | ICD-10-CM | POA: Diagnosis not present

## 2022-12-24 DIAGNOSIS — E7849 Other hyperlipidemia: Secondary | ICD-10-CM | POA: Diagnosis not present

## 2022-12-24 DIAGNOSIS — Z79899 Other long term (current) drug therapy: Secondary | ICD-10-CM | POA: Diagnosis not present

## 2022-12-24 DIAGNOSIS — Z7251 High risk heterosexual behavior: Secondary | ICD-10-CM | POA: Diagnosis not present

## 2023-01-03 DIAGNOSIS — E039 Hypothyroidism, unspecified: Secondary | ICD-10-CM | POA: Diagnosis not present

## 2023-01-03 DIAGNOSIS — F649 Gender identity disorder, unspecified: Secondary | ICD-10-CM | POA: Diagnosis not present

## 2023-01-03 DIAGNOSIS — Z5181 Encounter for therapeutic drug level monitoring: Secondary | ICD-10-CM | POA: Diagnosis not present

## 2023-01-03 DIAGNOSIS — Z79899 Other long term (current) drug therapy: Secondary | ICD-10-CM | POA: Diagnosis not present

## 2023-01-03 DIAGNOSIS — F640 Transsexualism: Secondary | ICD-10-CM | POA: Diagnosis not present

## 2023-01-10 DIAGNOSIS — N179 Acute kidney failure, unspecified: Secondary | ICD-10-CM | POA: Diagnosis not present

## 2023-01-10 DIAGNOSIS — I10 Essential (primary) hypertension: Secondary | ICD-10-CM | POA: Diagnosis not present

## 2023-01-10 DIAGNOSIS — N1832 Chronic kidney disease, stage 3b: Secondary | ICD-10-CM | POA: Diagnosis not present

## 2023-01-17 DIAGNOSIS — I10 Essential (primary) hypertension: Secondary | ICD-10-CM | POA: Diagnosis not present

## 2023-01-17 DIAGNOSIS — N1832 Chronic kidney disease, stage 3b: Secondary | ICD-10-CM | POA: Diagnosis not present

## 2023-02-01 ENCOUNTER — Ambulatory Visit: Payer: Medicare Other | Admitting: Infectious Diseases

## 2023-02-04 ENCOUNTER — Telehealth: Payer: Self-pay

## 2023-02-04 ENCOUNTER — Other Ambulatory Visit (HOSPITAL_COMMUNITY): Payer: Self-pay

## 2023-02-04 NOTE — Telephone Encounter (Signed)
[facility] Patient Advocate Encounter  Insurance verification completed.    The patient is insured through [facility]). Patient has Medicare and is not eligible for a copay card, but may be able to apply for patient assistance or Medicare RX Payment Plan (Patient Must reach out to their plan, if eligible for payment plan), if available.    Ran test claim for DESCOVY  The current 30 day co-pay is $820.32.  Ran test claim for TRUVADA   The current 30 day co-pay is $72.48.  FOR TRUVADA AND DESCOVY I WAS ALSO GIVEN THIS IN THE REJECTION   Ran test claim for APRETUDE  The current 30 day co-pay is $0.  We will continue to follow to see if copay assistance is needed.  This test claim was processed through [facility]- copay amounts may vary at other pharmacies due to pharmacy/plan contracts, or as the patient moves through the different stages of their insurance plan.

## 2023-02-09 ENCOUNTER — Other Ambulatory Visit (HOSPITAL_COMMUNITY): Payer: Self-pay

## 2023-02-14 ENCOUNTER — Other Ambulatory Visit (HOSPITAL_COMMUNITY)
Admission: RE | Admit: 2023-02-14 | Discharge: 2023-02-14 | Disposition: A | Discharge status: Home / Self Care | Payer: Medicare Other | Source: Ambulatory Visit | Attending: Infectious Disease | Admitting: Infectious Disease

## 2023-02-14 ENCOUNTER — Other Ambulatory Visit (HOSPITAL_COMMUNITY): Payer: Self-pay

## 2023-02-14 ENCOUNTER — Other Ambulatory Visit: Payer: Self-pay

## 2023-02-14 ENCOUNTER — Ambulatory Visit (INDEPENDENT_AMBULATORY_CARE_PROVIDER_SITE_OTHER): Payer: Medicare Other | Admitting: Pharmacist

## 2023-02-14 DIAGNOSIS — Z113 Encounter for screening for infections with a predominantly sexual mode of transmission: Secondary | ICD-10-CM | POA: Diagnosis present

## 2023-02-14 DIAGNOSIS — Z2981 Encounter for HIV pre-exposure prophylaxis: Secondary | ICD-10-CM

## 2023-02-14 DIAGNOSIS — Z7189 Other specified counseling: Secondary | ICD-10-CM

## 2023-02-14 DIAGNOSIS — Z23 Encounter for immunization: Secondary | ICD-10-CM

## 2023-02-14 MED ORDER — CABOTEGRAVIR ER 600 MG/3ML IM SUER
600.0000 mg | Freq: Once | INTRAMUSCULAR | Status: AC
Start: 2023-02-14 — End: 2023-02-14
  Administered 2023-02-14: 600 mg via INTRAMUSCULAR

## 2023-02-14 MED ORDER — APRETUDE 600 MG/3ML IM SUER
600.0000 mg | INTRAMUSCULAR | 1 refills | Status: DC
Start: 2023-02-14 — End: 2023-04-25
  Filled 2023-02-14: qty 3, 30d supply, fill #0
  Filled 2023-03-08: qty 3, 30d supply, fill #1

## 2023-02-14 NOTE — Progress Notes (Signed)
NEW REFERRAL TO CPP CLINIC    HPI: Kiri Hinderliter is a 58 y.o. adult who presents to the [facility] pharmacy clinic to discuss and initiate PrEP.  Insured   [x]    Uninsured  []    Patient Active Problem List   Diagnosis Date Noted   Dyspepsia    Murmur, cardiac 05/14/2016   Premature ventricular contractions 05/14/2016   Endometriosis 08/31/2013    Patient's Medications  New Prescriptions   No medications on file  Previous Medications   ALBUTEROL (VENTOLIN HFA) 108 (90 BASE) MCG/ACT INHALER    Inhale 2 puffs into the lungs every 6 (six) hours as needed for wheezing or shortness of breath.   ATORVASTATIN (LIPITOR) 20 MG TABLET    Take 20 mg by mouth daily.   BUDESON-GLYCOPYRROL-FORMOTEROL (BREZTRI AEROSPHERE) 160-9-4.8 MCG/ACT AERO    Inhale 2 puffs into the lungs 2 (two) times daily.   CALCIUM-VITAMIN D (OSCAL WITH D) 500-200 MG-UNIT TABLET    Take 2 tablets by mouth daily.   CINNAMON 500 MG CAPSULE    Take 1,000 mg by mouth daily.   CRANBERRY-VITAMIN C-PROBIOTIC (AZO CRANBERRY PO)    Take 2 tablets by mouth daily.   DOCUSATE SODIUM (COLACE) 100 MG CAPSULE    Take 100 mg by mouth daily.   DOCUSATE SODIUM (COLACE) 100 MG CAPSULE    Take 1 capsule (100 mg total) by mouth 2 (two) times daily. To keep stools soft   EMTRICITABINE-TENOFOVIR (TRUVADA) 200-300 MG TABLET    Take 1 tablet by mouth daily.   FENOFIBRATE 54 MG TABLET    Take 54 mg by mouth daily.   GABAPENTIN (NEURONTIN) 300 MG CAPSULE    Take 600 mg by mouth daily.   GABAPENTIN (NEURONTIN) 300 MG CAPSULE    Take 1 capsule (300 mg total) by mouth at bedtime. PRN   HYDROCODONE-ACETAMINOPHEN (NORCO/VICODIN) 5-325 MG TABLET    Take 1-2 tablets by mouth every 6 (six) hours as needed.   HYDROCODONE-ACETAMINOPHEN (NORCO/VICODIN) 5-325 MG TABLET    Take 1 tablet by mouth every 4 (four) hours as needed for moderate pain.   LANSOPRAZOLE (PREVACID) 15 MG CAPSULE    Take 15 mg by mouth daily.   NEEDLE, DISP, 18 G (B-D HYPODERMIC NEEDLE  18GX1.5") 18G X 1-1/2" MISC    Use q2 weeks as directed   OXYCODONE (OXY IR/ROXICODONE) 5 MG IMMEDIATE RELEASE TABLET    Take 1 tablet (5 mg total) by mouth every 4 (four) hours as needed for severe pain.   SPIRONOLACTONE (ALDACTONE) 25 MG TABLET    Take 25 mg by mouth daily.   SYNTHROID 150 MCG TABLET    Take 150 mcg by mouth daily. 4 days per week; M-Th only   SYRINGE-NEEDLE, DISP, 3 ML (B-D 3CC LUER-LOK SYR 25GX5/8") 25G X 5/8" 3 ML MISC    USE TO INJECT TESTOSTERONE EVERY 2 WEEKS AS DIRECTED   TESTOSTERONE CYPIONATE (DEPOTESTOSTERONE CYPIONATE) 200 MG/ML INJECTION    Inject 0.5 mLs into the muscle every 14 (fourteen) days.   VITAMIN D PO    Take 2,000 Units by mouth daily.  Modified Medications   No medications on file  Discontinued Medications   No medications on file        No data to display          Labs:  SCr: Lab Results  Component Value Date   CREATININE 1.79 (H) 06/22/2022   CREATININE 1.50 (H) 01/07/2021   CREATININE 1.46 (H) 09/25/2019   CREATININE  1.07 (H) 04/08/2017   CREATININE 1.03 (H) 01/23/2015   HIV No results found for: "HIV" Hepatitis B No results found for: "HEPBSAB", "HEPBSAG", "HEPBCAB" Hepatitis C No results found for: "HEPCAB", "HCVRNAPCRQN" Hepatitis A No results found for: "HAV" RPR and STI No results found for: "LABRPR", "RPRTITER"      No data to display          Assessment: Mardelle Matte comes in today to discuss initiating Apretude for HIV PrEP. He has followed with Clydie Braun for several years at the [facility] and has been taking Truvada for several years. He was diagnosed with kidney disease back in 2020/2021 due to overuse of NSAIDS (per note) and medication used for a toenail fungus. His CrCl has declined over the last several years with recent SCr of 2.07 on 01/17/23. His nephrologist recommended he stop taking Truvada and try to find another medication for PrEP that did not contain tenofovir. Dr. Sampson Goon referred him here  to discuss Apretude.   He is sexually active with men only and engages in oral and insertive sexual practices. No rectal sex. Last checked for HIV on 12/25/22 and was negative. He was also negative for all STIs at that point as well. He has 2 steady partners right now. He does not use condoms with one as they are also on PrEP and share their results with him frequently. He uses condoms most of the time with his other partner. He is a transgender female on testosterone therapy managed by an endocrinologist. He states that he is immune to Hepatitis A but is in need of the Hepatitis B vaccine. Labs from 06/11/22 showed no immunity to Hepatitis A or B and negative for Hepatitis C. He wishes to receive the Hepatitis B vaccine today so will start that series. He will receive his 2nd and final vaccine at his next follow up appointment. I will check his Hepatitis A antibody to ensure he does have immunity. He also requests a COVID-19 vaccine today as well. Apretude is approved through his [employer] and is $0.   He has several doctor's appointments that he keeps up with and also helps take care of his mother. After thorough discussion of his options, he elects to start the injection. Discussed when he will obtain adequate levels of protection and to make sure to use condoms for the first few weeks.   Counseled that Apretude is one intramuscular injection in the gluteal muscle for each visit. Explained that the second injection is 30 days after the initial injection then every 2 months thereafter. Explained that showing up to injection appointments is very important and warned that if appointments are missed, protection will be minimal and the risk of acquiring HIV becomes much higher. Counseled on possible side effects associated with the injections such as injection site pain, which is usually mild to moderate in nature, injection site nodules, and injection site reactions. Advised that he can take Motrin or  Tylenol for injection site pain if needed. He is unable to take any NSAIDs due to kidney disease so will medicate with Tylenol, if needed. Answered all questions. He would like [place] to move forward with initiating Apretude. 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  He states that he hasn't been sexually active since stopping Truvada on 01/18/23. He lives in [place] and it takes ~30-40 minutes to get here. Advised that we usually like a negative HIV test within 7  days prior to initiating Apretude but since he has not been sexually active since he stopped Truvada, will go ahead and administer his 1st dose of Apretude today. Administered on right upper outer quadrant of the gluteal muscle  He also had a bump on his groin area that looked like a pimple and popped a few days ago. He showed me and it looks to be healing nicely with a minor scab. Ensured him that this does not look like a STD. We will check all labs today including HIV, STDs, and a few other labs. He has a hard time providing a urine sample and wishes to bring one from home for his next visit. Advised him that it was fine as long as it is fresh and not out of the fridge longer than an hour. Will see him back in 1 month for PrEP follow up and his next Apretude injection.    Plan: - HIV antibody, RPR, oral cytology, Hepatitis A antibody, and Hepatitis B surface antigen today - Apretude administered - Heplisav B vaccine #1/2 today; next due in Feb at next appointment - COVID vaccine administered - Follow up for next Apretude injection on 03/14/23  Brenen Beigel L. Lelend Heinecke, PharmD, BCIDP, AAHIVP, CPP Clinical Pharmacist Practitioner Infectious Diseases Clinical Pharmacist [facility] 02/14/2023, 10:28 AM

## 2023-02-14 NOTE — Progress Notes (Signed)
Specialty Pharmacy Initial Fill Coordination Note  Destiny Wright is a 58 y.o. adult contacted today regarding initial fill of specialty medication(s) Cabotegravir (Apretude)   Patient requested Courier to Provider Office   Delivery date: 02/16/23   Verified address: [address] [place] [address]   Medication will be filled on 02/15/23.   Patient is aware of 0.00 copayment.

## 2023-02-14 NOTE — Patient Instructions (Addendum)
It was great meeting you today!   You received the long-acting injectable, Apretude, today. Injection site reactions are common with the first few injections. They are generally mild to moderate in nature and only last a few days. For the first 2-3 injections, take an OTC pain medication, such as Motrin or Tylenol, within a couple hours before your injection and continue as needed for a few days. You may also apply a warm compress or heating pad to the injection site for 15-20 minutes after the injection. Please try not to rub the injection site as this can disrupt the medication pocket in your muscle.   Other side effect such as a nodule at the site of injection, pain, tenderness, swelling, or bruising can happen but are rare. Please let us know if you have any issues.  We are not sure how long it takes for Apretude to get protective levels in your body to prevent HIV infection. Please use condoms for the first 2 weeks after receiving your first injection. Apretude lasts a long time in your body but does not keep protective levels in your body for more than a few months. Please make all of your scheduled appointments and let us know ASAP if you have to miss an injection.   You received the Heplisav B vaccine today! LOT [id number], expiration date 02/17/25. You will receive you 2nd and final one next month.  You also received the updated 2024-2025 COVID-19 vaccine today. LOT [id number], expiration date 04/25/23.   Please send [place] a MyChart message if you have any questions. Or reach out via telephone [phone]. Have a great day!  Keshaun Dubey L. Hedaya Latendresse, PharmD [facility] Clinical Pharmacist Practitioner

## 2023-02-15 ENCOUNTER — Other Ambulatory Visit: Payer: Self-pay

## 2023-02-15 LAB — HEPATITIS A ANTIBODY, TOTAL: Hepatitis A AB,Total: REACTIVE — AB

## 2023-02-15 LAB — CYTOLOGY, (ORAL, ANAL, URETHRAL) ANCILLARY ONLY
Chlamydia: NEGATIVE
Comment: NEGATIVE
Comment: NORMAL
Neisseria Gonorrhea: NEGATIVE

## 2023-02-15 LAB — HEPATITIS B SURFACE ANTIGEN: Hepatitis B Surface Ag: NONREACTIVE

## 2023-02-15 LAB — HIV ANTIBODY (ROUTINE TESTING W REFLEX): HIV 1&2 Ab, 4th Generation: NONREACTIVE

## 2023-02-15 LAB — RPR: RPR Ser Ql: NONREACTIVE

## 2023-02-16 ENCOUNTER — Telehealth: Payer: Self-pay

## 2023-02-16 NOTE — Telephone Encounter (Signed)
[facility] Patient Advocate Encounter  Patient's medications APRETUDE have been couriered to [facility] from [facility] and will be administered at the patients appointment on 02/14/23.  Kae Heller, CPhT Specialty Pharmacy Patient [facility] for Infectious Disease Phone: [phone] Fax:  [phone]

## 2023-03-08 ENCOUNTER — Other Ambulatory Visit: Payer: Self-pay

## 2023-03-08 ENCOUNTER — Other Ambulatory Visit (HOSPITAL_COMMUNITY): Payer: Self-pay

## 2023-03-08 NOTE — Progress Notes (Signed)
Specialty Pharmacy Refill Coordination Note  Destiny Wright is a 58 y.o. adult assessed today regarding refills of clinic administered specialty medication(s) Cabotegravir (Apretude)   Clinic requested Courier to Provider Office   Delivery date: 03/14/23   Verified address: [address] [place] [address]   Medication will be filled on 03/11/23.

## 2023-03-14 ENCOUNTER — Telehealth: Payer: Self-pay

## 2023-03-14 ENCOUNTER — Ambulatory Visit (INDEPENDENT_AMBULATORY_CARE_PROVIDER_SITE_OTHER): Payer: Medicare Other | Admitting: Pharmacist

## 2023-03-14 ENCOUNTER — Other Ambulatory Visit: Payer: Self-pay

## 2023-03-14 DIAGNOSIS — Z23 Encounter for immunization: Secondary | ICD-10-CM | POA: Diagnosis not present

## 2023-03-14 DIAGNOSIS — Z113 Encounter for screening for infections with a predominantly sexual mode of transmission: Secondary | ICD-10-CM

## 2023-03-14 DIAGNOSIS — Z2981 Encounter for HIV pre-exposure prophylaxis: Secondary | ICD-10-CM | POA: Diagnosis not present

## 2023-03-14 MED ORDER — CABOTEGRAVIR ER 600 MG/3ML IM SUER
600.0000 mg | Freq: Once | INTRAMUSCULAR | Status: AC
Start: 2023-03-14 — End: 2023-03-14
  Administered 2023-03-14: 600 mg via INTRAMUSCULAR

## 2023-03-14 NOTE — Progress Notes (Signed)
 HPI: Destiny Wright is a 58 y.o. adult who presents to the [facility] pharmacy clinic for Apretude administration and HIV PrEP follow up.  Insured   [x]    Uninsured  []    Patient Active Problem List   Diagnosis Date Noted   Dyspepsia    Murmur, cardiac 05/14/2016   Premature ventricular contractions 05/14/2016   Endometriosis 08/31/2013    Patient's Medications  New Prescriptions   No medications on file  Previous Medications   ALBUTEROL (VENTOLIN HFA) 108 (90 BASE) MCG/ACT INHALER    Inhale 2 puffs into the lungs every 6 (six) hours as needed for wheezing or shortness of breath.   ATORVASTATIN (LIPITOR) 20 MG TABLET    Take 20 mg by mouth daily.   BUDESON-GLYCOPYRROL-FORMOTEROL (BREZTRI AEROSPHERE) 160-9-4.8 MCG/ACT AERO    Inhale 2 puffs into the lungs 2 (two) times daily.   CABOTEGRAVIR ER (APRETUDE) 600 MG/3ML INJECTION    Inject 3 mLs (600 mg total) into the muscle every 30 (thirty) days.   CALCIUM-VITAMIN D (OSCAL WITH D) 500-200 MG-UNIT TABLET    Take 2 tablets by mouth daily.   CINNAMON 500 MG CAPSULE    Take 1,000 mg by mouth daily.   CRANBERRY-VITAMIN C-PROBIOTIC (AZO CRANBERRY PO)    Take 2 tablets by mouth daily.   DOCUSATE SODIUM (COLACE) 100 MG CAPSULE    Take 100 mg by mouth daily.   DOCUSATE SODIUM (COLACE) 100 MG CAPSULE    Take 1 capsule (100 mg total) by mouth 2 (two) times daily. To keep stools soft   EMTRICITABINE-TENOFOVIR (TRUVADA) 200-300 MG TABLET    Take 1 tablet by mouth daily.   FENOFIBRATE 54 MG TABLET    Take 54 mg by mouth daily.   GABAPENTIN (NEURONTIN) 300 MG CAPSULE    Take 600 mg by mouth daily.   GABAPENTIN (NEURONTIN) 300 MG CAPSULE    Take 1 capsule (300 mg total) by mouth at bedtime. PRN   HYDROCODONE-ACETAMINOPHEN (NORCO/VICODIN) 5-325 MG TABLET    Take 1-2 tablets by mouth every 6 (six) hours as needed.   HYDROCODONE-ACETAMINOPHEN (NORCO/VICODIN) 5-325 MG TABLET    Take 1 tablet by mouth every 4 (four) hours as needed for moderate pain.    LANSOPRAZOLE (PREVACID) 15 MG CAPSULE    Take 15 mg by mouth daily.   NEEDLE, DISP, 18 G (B-D HYPODERMIC NEEDLE 18GX1.5") 18G X 1-1/2" MISC    Use q2 weeks as directed   OXYCODONE (OXY IR/ROXICODONE) 5 MG IMMEDIATE RELEASE TABLET    Take 1 tablet (5 mg total) by mouth every 4 (four) hours as needed for severe pain.   SPIRONOLACTONE (ALDACTONE) 25 MG TABLET    Take 25 mg by mouth daily.   SYNTHROID 150 MCG TABLET    Take 150 mcg by mouth daily. 4 days per week; M-Th only   SYRINGE-NEEDLE, DISP, 3 ML (B-D 3CC LUER-LOK SYR 25GX5/8") 25G X 5/8" 3 ML MISC    USE TO INJECT TESTOSTERONE EVERY 2 WEEKS AS DIRECTED   TESTOSTERONE CYPIONATE (DEPOTESTOSTERONE CYPIONATE) 200 MG/ML INJECTION    Inject 0.5 mLs into the muscle every 14 (fourteen) days.   VITAMIN D PO    Take 2,000 Units by mouth daily.  Modified Medications   No medications on file  Discontinued Medications   No medications on file    Allergies: Allergies  Allergen Reactions   Diclofenac Other (See Comments)    Gas , nauseated , headache    Molnupiravir Hives   Nsaids  Due to Kidney disease    Labs: No results found for: "HIV1RNAQUANT", "HIV1RNAVL", "CD4TABS"  RPR and STI Lab Results  Component Value Date   LABRPR NON-REACTIVE 02/14/2023    STI Results GC CT  02/14/2023  3:58 PM Negative  Negative     Hepatitis B Lab Results  Component Value Date   HEPBSAG NON-REACTIVE 02/14/2023   Hepatitis C No results found for: "HEPCAB", "HCVRNAPCRQN" Hepatitis A Lab Results  Component Value Date   HAV REACTIVE (A) 02/14/2023   Lipids: No results found for: "CHOL", "TRIG", "HDL", "CHOLHDL", "VLDL", "LDLCALC"  TARGET DATE: May 15, 2023  Assessment: Destiny Wright presents today for his Apretude injection and to follow up for HIV PrEP. No issues with past injections except soreness. Suggested the patient moves around more or exercises on injection days to help alleviate soreness. Denies any symptoms of acute HIV. Last STI  screening was on 02/14/23 and was negative. No known exposures to any STIs since last visit. He agrees to STI testing today (oral swab and urine). Will check HIV RNA. He requested to receive the shingles vaccine today. Recommended administering Hepatitis B 2/2 dose today as well and he agreed to this. He mentioned that he will be seeing his nephrologist on April 1 and will be prescribed additional medications. Recommended that he lets [place] know of any new medications at that time.   Per [employer] guidelines, a rapid HIV test should be drawn prior to Apretude administration. Due to state shortage of rapid HIV tests, this is temporarily unable to be done. Per decision from [facility] physicians, we will proceed with Apretude administration at this time without a negative rapid HIV test beforehand. HIV RNA was collected today and is in process.  Administered cabotegravir 600mg /82mL in right upper outer quadrant of the gluteal muscle. Will see him back in 2 months for injection, labs, and HIV PrEP follow up.   Plan: - Apretude injection administered - HIV RNA today - Next injection, labs, and PrEP follow up appointment scheduled for 05/09/23 - STI testing (oral swab and urine) today - Shingrix vaccine (1 of 2 doses)(right deltoid) administered today, next dose due in 2-6 months. - Hepatitis B vaccine (2 of 2 doses)(right deltoid) administered today  - Call with any issues or questions  Buddy Duty, PharmD [facility]

## 2023-03-14 NOTE — Telephone Encounter (Signed)
 [facility] Patient Advocate Encounter  Patient's medications Apretude have been couriered to [facility] from [facility] and will be administered at the patients appointment on 03/14/23.  Clearance Coots, CPhT Specialty Pharmacy Patient [facility] for [facility] Disease Phone: [phone] Fax:  [phone]

## 2023-03-15 LAB — C. TRACHOMATIS/N. GONORRHOEAE RNA
C. trachomatis RNA, TMA: NOT DETECTED
N. gonorrhoeae RNA, TMA: NOT DETECTED

## 2023-03-15 LAB — GC/CHLAMYDIA PROBE, AMP (THROAT)
Chlamydia trachomatis RNA: NOT DETECTED
Neisseria gonorrhoeae RNA: NOT DETECTED

## 2023-03-17 LAB — RPR: RPR Ser Ql: NONREACTIVE

## 2023-03-17 LAB — HIV-1 RNA QUANT-NO REFLEX-BLD
HIV 1 RNA Quant: NOT DETECTED {copies}/mL
HIV-1 RNA Quant, Log: NOT DETECTED {Log}

## 2023-04-25 ENCOUNTER — Other Ambulatory Visit: Payer: Self-pay

## 2023-04-25 ENCOUNTER — Other Ambulatory Visit (HOSPITAL_COMMUNITY): Payer: Self-pay

## 2023-04-25 ENCOUNTER — Other Ambulatory Visit: Payer: Self-pay | Admitting: Pharmacist

## 2023-04-25 DIAGNOSIS — Z2981 Encounter for HIV pre-exposure prophylaxis: Secondary | ICD-10-CM

## 2023-04-25 MED ORDER — APRETUDE 600 MG/3ML IM SUER
600.0000 mg | INTRAMUSCULAR | 5 refills | Status: AC
  Filled 2023-04-25: qty 3, 60d supply, fill #0
  Filled 2023-06-28: qty 3, 60d supply, fill #1
  Filled 2023-09-01: qty 3, 60d supply, fill #2
  Filled 2023-11-03: qty 3, 60d supply, fill #3

## 2023-04-25 NOTE — Progress Notes (Signed)
 Specialty Pharmacy Refill Coordination Note  Destiny Wright is a 58 y.o. adult assessed today regarding refills of clinic administered specialty medication(s) Cabotegravir (Apretude)   Clinic requested Courier to Provider Office   Delivery date: 05/05/23   Verified address: [address] [place] [address]   Medication will be filled on 05/04/23.

## 2023-05-04 ENCOUNTER — Other Ambulatory Visit: Payer: Self-pay

## 2023-05-05 ENCOUNTER — Telehealth: Payer: Self-pay

## 2023-05-05 NOTE — Telephone Encounter (Signed)
 [facility] Patient Advocate Encounter  Patient's medications APRETUDE have been couriered to [facility] from [facility] and will be administered at the patients appointment on 05/09/23.  Verline Glow, CPhT Specialty Pharmacy Patient [facility] for Infectious Disease Phone: [phone] Fax:  [phone]

## 2023-05-08 NOTE — Progress Notes (Signed)
 HPI: Destiny Wright is a 58 y.o. adult who presents to the [facility] pharmacy clinic for Apretude  administration and HIV PrEP follow up.  Insured   [x]    Uninsured  []    Patient Active Problem List   Diagnosis Date Noted   Dyspepsia    Murmur, cardiac 05/14/2016   Premature ventricular contractions 05/14/2016   Endometriosis 08/31/2013    Patient's Medications  New Prescriptions   No medications on file  Previous Medications   ALBUTEROL (VENTOLIN HFA) 108 (90 BASE) MCG/ACT INHALER    Inhale 2 puffs into the lungs every 6 (six) hours as needed for wheezing or shortness of breath.   ATORVASTATIN (LIPITOR) 20 MG TABLET    Take 20 mg by mouth daily.   BUDESON-GLYCOPYRROL-FORMOTEROL (BREZTRI AEROSPHERE) 160-9-4.8 MCG/ACT AERO    Inhale 2 puffs into the lungs 2 (two) times daily.   CABOTEGRAVIR  ER (APRETUDE ) 600 MG/3ML INJECTION    Inject 3 mLs (600 mg total) into the muscle every 2 (two) months.   CALCIUM-VITAMIN D (OSCAL WITH D) 500-200 MG-UNIT TABLET    Take 2 tablets by mouth daily.   CINNAMON 500 MG CAPSULE    Take 1,000 mg by mouth daily.   CRANBERRY-VITAMIN C-PROBIOTIC (AZO CRANBERRY PO)    Take 2 tablets by mouth daily.   DOCUSATE SODIUM  (COLACE) 100 MG CAPSULE    Take 100 mg by mouth daily.   DOCUSATE SODIUM  (COLACE) 100 MG CAPSULE    Take 1 capsule (100 mg total) by mouth 2 (two) times daily. To keep stools soft   EMTRICITABINE-TENOFOVIR (TRUVADA) 200-300 MG TABLET    Take 1 tablet by mouth daily.   FENOFIBRATE 54 MG TABLET    Take 54 mg by mouth daily.   GABAPENTIN  (NEURONTIN ) 300 MG CAPSULE    Take 600 mg by mouth daily.   GABAPENTIN  (NEURONTIN ) 300 MG CAPSULE    Take 1 capsule (300 mg total) by mouth at bedtime. PRN   HYDROCODONE -ACETAMINOPHEN  (NORCO/VICODIN) 5-325 MG TABLET    Take 1-2 tablets by mouth every 6 (six) hours as needed.   HYDROCODONE -ACETAMINOPHEN  (NORCO/VICODIN) 5-325 MG TABLET    Take 1 tablet by mouth every 4 (four) hours as needed for moderate pain.    LANSOPRAZOLE (PREVACID) 15 MG CAPSULE    Take 15 mg by mouth daily.   NEEDLE, DISP, 18 G (B-D HYPODERMIC NEEDLE 18GX1.5") 18G X 1-1/2" MISC    Use q2 weeks as directed   OXYCODONE  (OXY IR/ROXICODONE ) 5 MG IMMEDIATE RELEASE TABLET    Take 1 tablet (5 mg total) by mouth every 4 (four) hours as needed for severe pain.   SPIRONOLACTONE (ALDACTONE) 25 MG TABLET    Take 25 mg by mouth daily.   SYNTHROID 150 MCG TABLET    Take 150 mcg by mouth daily. 4 days per week; M-Th only   SYRINGE-NEEDLE, DISP, 3 ML (B-D 3CC LUER-LOK SYR 25GX5/8") 25G X 5/8" 3 ML MISC    USE TO INJECT TESTOSTERONE  EVERY 2 WEEKS AS DIRECTED   TESTOSTERONE  CYPIONATE (DEPOTESTOSTERONE CYPIONATE) 200 MG/ML INJECTION    Inject 0.5 mLs into the muscle every 14 (fourteen) days.   VITAMIN D PO    Take 2,000 Units by mouth daily.  Modified Medications   No medications on file  Discontinued Medications   No medications on file    Allergies: Allergies  Allergen Reactions   Diclofenac  Other (See Comments)    Gas , nauseated , headache    Molnupiravir Hives   Nsaids  Due to Kidney disease    Labs: Lab Results  Component Value Date   HIV1RNAQUANT Not Detected 03/14/2023    RPR and STI Lab Results  Component Value Date   LABRPR NON-REACTIVE 03/14/2023   LABRPR NON-REACTIVE 02/14/2023    STI Results GC CT  02/14/2023  3:58 PM Negative  Negative     Hepatitis B Lab Results  Component Value Date   HEPBSAG NON-REACTIVE 02/14/2023   Hepatitis C No results found for: "HEPCAB", "HCVRNAPCRQN" Hepatitis A Lab Results  Component Value Date   HAV REACTIVE (A) 02/14/2023   Lipids: No results found for: "CHOL", "TRIG", "HDL", "CHOLHDL", "VLDL", "LDLCALC"  TARGET DATE: 27th  Assessment: Destiny Wright presents today for 2 month Apretude  injection and to follow up for HIV PrEP. No issues with past injections. Denies any symptoms of acute HIV. Last STI screening was on 03/14/2023 and was negative. No known exposures to any STIs  since last visit. Destiny Wright agrees to STI testing today.    Per [employer] guidelines, a rapid HIV test should be drawn prior to Apretude  administration. Due to state shortage of rapid HIV tests, this is temporarily unable to be done. Per decision from [facility] physicians, we will proceed with Apretude  administration at this time without a negative rapid HIV test beforehand. HIV RNA was collected today and is in process.  Administered cabotegravir  600mg /62mL in right upper outer quadrant of the gluteal muscle. Will see Destiny Wright back in 2 months for injection, labs, and HIV PrEP follow up.  Immunizations: Last shingles vaccination was 03/14/2023 and the second dose is due within 2-6 months. Today is a few days early, but patient is aware that he will be due at the next visit.   Plan: - Apretude  injection administered - HIV RNA today - Oral/urine cytologies and RPR  - Next injection, labs, and PrEP follow up appointment scheduled for 07/18/2023 with Cassie - Call with any issues or questions   Barbra Boone, PharmD PGY1 Acute Care Pharmacy Resident  05/09/2023 3:35 PM

## 2023-05-09 ENCOUNTER — Other Ambulatory Visit: Payer: Self-pay

## 2023-05-09 ENCOUNTER — Ambulatory Visit (INDEPENDENT_AMBULATORY_CARE_PROVIDER_SITE_OTHER): Payer: Medicare Other | Admitting: Pharmacist

## 2023-05-09 DIAGNOSIS — Z2981 Encounter for HIV pre-exposure prophylaxis: Secondary | ICD-10-CM | POA: Diagnosis not present

## 2023-05-09 DIAGNOSIS — Z113 Encounter for screening for infections with a predominantly sexual mode of transmission: Secondary | ICD-10-CM

## 2023-05-09 MED ORDER — CABOTEGRAVIR ER 600 MG/3ML IM SUER
600.0000 mg | Freq: Once | INTRAMUSCULAR | Status: AC
  Administered 2023-05-09: 600 mg via INTRAMUSCULAR

## 2023-05-10 LAB — GC/CHLAMYDIA PROBE, AMP (THROAT)
Chlamydia trachomatis RNA: NOT DETECTED
Neisseria gonorrhoeae RNA: NOT DETECTED

## 2023-05-10 LAB — C. TRACHOMATIS/N. GONORRHOEAE RNA
C. trachomatis RNA, TMA: NOT DETECTED
N. gonorrhoeae RNA, TMA: NOT DETECTED

## 2023-05-12 LAB — HIV-1 RNA QUANT-NO REFLEX-BLD
HIV 1 RNA Quant: NOT DETECTED {copies}/mL
HIV-1 RNA Quant, Log: NOT DETECTED {Log_copies}/mL

## 2023-05-12 LAB — RPR: RPR Ser Ql: NONREACTIVE

## 2023-06-28 ENCOUNTER — Other Ambulatory Visit (HOSPITAL_COMMUNITY): Payer: Self-pay

## 2023-06-28 ENCOUNTER — Other Ambulatory Visit: Payer: Self-pay

## 2023-06-28 NOTE — Progress Notes (Signed)
 Specialty Pharmacy Refill Coordination Note  Destiny Wright is a 58 y.o. adult assessed today regarding refills of clinic administered specialty medication(s) Cabotegravir  (Apretude )   Clinic requested Courier to Provider Office   Delivery date: 07/11/23   Verified address: [address] [place] [address]   Medication will be filled on 07/08/23.

## 2023-07-08 ENCOUNTER — Other Ambulatory Visit: Payer: Self-pay

## 2023-07-08 ENCOUNTER — Other Ambulatory Visit (HOSPITAL_COMMUNITY): Payer: Self-pay

## 2023-07-08 NOTE — Progress Notes (Addendum)
 HPI: Destiny Wright is a 58 y.o. adult who presents to the [facility] pharmacy clinic for Apretude  administration and HIV PrEP follow up.  Insured   [x]    Uninsured  []    Patient Active Problem List   Diagnosis Date Noted   Dyspepsia    Murmur, cardiac 05/14/2016   Premature ventricular contractions 05/14/2016   Endometriosis 08/31/2013    Patient's Medications  New Prescriptions   No medications on file  Previous Medications   ALBUTEROL (VENTOLIN HFA) 108 (90 BASE) MCG/ACT INHALER    Inhale 2 puffs into the lungs every 6 (six) hours as needed for wheezing or shortness of breath.   ATORVASTATIN (LIPITOR) 20 MG TABLET    Take 20 mg by mouth daily.   BUDESON-GLYCOPYRROL-FORMOTEROL (BREZTRI AEROSPHERE) 160-9-4.8 MCG/ACT AERO    Inhale 2 puffs into the lungs 2 (two) times daily.   CABOTEGRAVIR  ER (APRETUDE ) 600 MG/3ML INJECTION    Inject 3 mLs (600 mg total) into the muscle every 2 (two) months.   CALCIUM-VITAMIN D (OSCAL WITH D) 500-200 MG-UNIT TABLET    Take 2 tablets by mouth daily.   CINNAMON 500 MG CAPSULE    Take 1,000 mg by mouth daily.   CRANBERRY-VITAMIN C-PROBIOTIC (AZO CRANBERRY PO)    Take 2 tablets by mouth daily.   DOCUSATE SODIUM  (COLACE) 100 MG CAPSULE    Take 100 mg by mouth daily.   DOCUSATE SODIUM  (COLACE) 100 MG CAPSULE    Take 1 capsule (100 mg total) by mouth 2 (two) times daily. To keep stools soft   EMTRICITABINE-TENOFOVIR (TRUVADA) 200-300 MG TABLET    Take 1 tablet by mouth daily.   FENOFIBRATE 54 MG TABLET    Take 54 mg by mouth daily.   GABAPENTIN  (NEURONTIN ) 300 MG CAPSULE    Take 600 mg by mouth daily.   GABAPENTIN  (NEURONTIN ) 300 MG CAPSULE    Take 1 capsule (300 mg total) by mouth at bedtime. PRN   HYDROCODONE -ACETAMINOPHEN  (NORCO/VICODIN) 5-325 MG TABLET    Take 1-2 tablets by mouth every 6 (six) hours as needed.   LANSOPRAZOLE (PREVACID) 15 MG CAPSULE    Take 15 mg by mouth daily.   NEEDLE, DISP, 18 G (B-D HYPODERMIC NEEDLE 18GX1.5) 18G X 1-1/2 MISC     Use q2 weeks as directed   OXYCODONE  (OXY IR/ROXICODONE ) 5 MG IMMEDIATE RELEASE TABLET    Take 1 tablet (5 mg total) by mouth every 4 (four) hours as needed for severe pain.   SPIRONOLACTONE (ALDACTONE) 25 MG TABLET    Take 25 mg by mouth daily.   SYNTHROID 150 MCG TABLET    Take 150 mcg by mouth daily. 4 days per week; M-Th only   SYRINGE-NEEDLE, DISP, 3 ML (B-D 3CC LUER-LOK SYR 25GX5/8) 25G X 5/8 3 ML MISC    USE TO INJECT TESTOSTERONE  EVERY 2 WEEKS AS DIRECTED   TESTOSTERONE  CYPIONATE (DEPOTESTOSTERONE CYPIONATE) 200 MG/ML INJECTION    Inject 0.5 mLs into the muscle every 14 (fourteen) days.   VITAMIN D PO    Take 2,000 Units by mouth daily.  Modified Medications   No medications on file  Discontinued Medications   No medications on file    Allergies: Allergies  Allergen Reactions   Diclofenac  Other (See Comments)    Gas , nauseated , headache    Molnupiravir Hives   Nsaids     Due to Kidney disease    Labs: Lab Results  Component Value Date   HIV1RNAQUANT NOT DETECTED 05/09/2023  HIV1RNAQUANT Not Detected 03/14/2023    RPR and STI Lab Results  Component Value Date   LABRPR NON-REACTIVE 05/09/2023   LABRPR NON-REACTIVE 03/14/2023   LABRPR NON-REACTIVE 02/14/2023    STI Results GC CT  02/14/2023  3:58 PM Negative  Negative     Hepatitis B Lab Results  Component Value Date   HEPBSAG NON-REACTIVE 02/14/2023   Hepatitis C No results found for: HEPCAB, HCVRNAPCRQN Hepatitis A Lab Results  Component Value Date   HAV REACTIVE (A) 02/14/2023   Lipids: No results found for: CHOL, TRIG, HDL, CHOLHDL, VLDL, LDLCALC  TARGET DATE: The 27th  Assessment: Destiny Wright presents today for his Apretude  injection and to follow up for HIV PrEP. No issues with past injections. Denies any symptoms of acute HIV. Last STI screening was on 05/09/23 and was negative. No known exposures to any STIs since last visit. Wishes to get STI testing at every visit so will do  that today. Does not participate in receptive anal intercourse so will always defer rectal STI screening per patient request. Updated medication list as well.   He had questions regarding new lenacapavir medication approved for PrEP. Discussed this medication, how it is administered, and potential side effects. He is interested in learning more once more information on monitoring is available and if insurance companies will approve at a later date. Due for second shingles vaccine today and agrees to receive.   Per [employer] guidelines, a rapid HIV test should be drawn prior to Apretude  administration. Due to state shortage of rapid HIV tests, this is temporarily unable to be done. Per decision from [facility] physicians, we will proceed with Apretude  administration at this time without a negative rapid HIV test beforehand. HIV RNA was collected today and is in process.  Administered cabotegravir  600mg /39mL in right upper outer quadrant of the gluteal muscle. Will see him back in 2 months for injection, labs, and HIV PrEP follow up.  Plan: - Apretude  injection administered - Final Shingrix  vaccine today - HIV RNA, RPR, and oral/urine cytologies today - Next injection, labs, and PrEP follow up appointment scheduled for 09/13/23 with Destiny Wright (prefers 4pm appointment time) - Call with any issues or questions  Calahan Pak L. Kynzlee Hucker, PharmD, BCIDP, AAHIVP, CPP Clinical Pharmacist Practitioner - Infectious Diseases Clinical Pharmacist Lead - Specialty Pharmacy [facility] for Infectious Disease 07/08/2023, 12:44 PM

## 2023-07-11 ENCOUNTER — Telehealth: Payer: Self-pay

## 2023-07-11 NOTE — Telephone Encounter (Signed)
 [facility] Patient Advocate Encounter  Patient's medications Apretude  have been couriered to [facility] from [facility] and will be administered at the patients appointment on 07/18/23.  Arland Hutchinson, CPhT Specialty Pharmacy Patient [facility] for Infectious Disease Phone: [phone] Fax:  [phone]

## 2023-07-18 ENCOUNTER — Other Ambulatory Visit: Payer: Self-pay

## 2023-07-18 ENCOUNTER — Other Ambulatory Visit (HOSPITAL_COMMUNITY)
Admission: RE | Admit: 2023-07-18 | Discharge: 2023-07-18 | Disposition: A | Discharge status: Home / Self Care | Source: Ambulatory Visit | Attending: Infectious Disease | Admitting: Infectious Disease

## 2023-07-18 ENCOUNTER — Ambulatory Visit: Admitting: Pharmacist

## 2023-07-18 DIAGNOSIS — Z113 Encounter for screening for infections with a predominantly sexual mode of transmission: Secondary | ICD-10-CM | POA: Insufficient documentation

## 2023-07-18 DIAGNOSIS — Z2981 Encounter for HIV pre-exposure prophylaxis: Secondary | ICD-10-CM | POA: Diagnosis not present

## 2023-07-18 DIAGNOSIS — Z23 Encounter for immunization: Secondary | ICD-10-CM

## 2023-07-18 MED ORDER — CABOTEGRAVIR ER 600 MG/3ML IM SUER
600.0000 mg | Freq: Once | INTRAMUSCULAR | Status: AC
Start: 2023-07-18 — End: 2023-07-18
  Administered 2023-07-18: 600 mg via INTRAMUSCULAR

## 2023-07-18 NOTE — Addendum Note (Signed)
 Addended by: Adia Crammer L on: 07/18/2023 05:14 PM   Modules accepted: Orders

## 2023-07-19 LAB — URINE CYTOLOGY ANCILLARY ONLY
Chlamydia: NEGATIVE
Comment: NEGATIVE
Comment: NORMAL
Neisseria Gonorrhea: NEGATIVE

## 2023-07-19 LAB — CYTOLOGY, (ORAL, ANAL, URETHRAL) ANCILLARY ONLY
Chlamydia: NEGATIVE
Comment: NEGATIVE
Comment: NORMAL
Neisseria Gonorrhea: NEGATIVE

## 2023-07-20 LAB — RPR: RPR Ser Ql: NONREACTIVE

## 2023-07-20 LAB — HIV-1 RNA QUANT-NO REFLEX-BLD
HIV 1 RNA Quant: NOT DETECTED {copies}/mL
HIV-1 RNA Quant, Log: NOT DETECTED {Log_copies}/mL

## 2023-08-23 ENCOUNTER — Other Ambulatory Visit: Payer: Self-pay

## 2023-09-01 ENCOUNTER — Other Ambulatory Visit (HOSPITAL_COMMUNITY): Payer: Self-pay

## 2023-09-01 ENCOUNTER — Other Ambulatory Visit: Payer: Self-pay

## 2023-09-01 NOTE — Progress Notes (Signed)
 Specialty Pharmacy Refill Coordination Note  Destiny Wright is a 58 y.o. adult assessed today regarding refills of clinic administered specialty medication(s) Cabotegravir  (Apretude )   Clinic requested Courier to Provider Office   Delivery date: 09/07/23   Verified address: [address] [place] [address]   Medication will be filled on 09/06/23.

## 2023-09-06 ENCOUNTER — Other Ambulatory Visit: Payer: Self-pay

## 2023-09-07 ENCOUNTER — Telehealth: Payer: Self-pay

## 2023-09-07 NOTE — Telephone Encounter (Signed)
 [facility] Patient Advocate Encounter  Patient's medications APRETUDE  have been couriered to [facility] from [facility] and will be administered at the patients appointment on 09/13/23.  Charmaine Sharps, CPhT Specialty Pharmacy Patient [facility] for Infectious Disease Phone: [phone] Fax:  [phone]

## 2023-09-12 NOTE — Progress Notes (Unsigned)
 HPI: Destiny Wright is a 58 y.o. adult who presents to the [facility] pharmacy clinic for Apretude  administration and HIV PrEP follow up.  Insured   [x]    Uninsured  []    Patient Active Problem List   Diagnosis Date Noted   Dyspepsia    Murmur, cardiac 05/14/2016   Premature ventricular contractions 05/14/2016   Endometriosis 08/31/2013    Patient's Medications  New Prescriptions   No medications on file  Previous Medications   ALBUTEROL (VENTOLIN HFA) 108 (90 BASE) MCG/ACT INHALER    Inhale 2 puffs into the lungs every 6 (six) hours as needed for wheezing or shortness of breath.   ATORVASTATIN (LIPITOR) 20 MG TABLET    Take 20 mg by mouth daily.   BUDESON-GLYCOPYRROL-FORMOTEROL (BREZTRI AEROSPHERE) 160-9-4.8 MCG/ACT AERO    Inhale 2 puffs into the lungs 2 (two) times daily.   CABOTEGRAVIR  ER (APRETUDE ) 600 MG/3ML INJECTION    Inject 3 mLs (600 mg total) into the muscle every 2 (two) months.   CALCIUM-VITAMIN D (OSCAL WITH D) 500-200 MG-UNIT TABLET    Take 2 tablets by mouth daily.   CHOLECALCIFEROL (VITAMIN D-1000 MAX ST) 25 MCG (1000 UT) TABLET    Take 1,000 Units by mouth.   CINNAMON 500 MG CAPSULE    Take 1,000 mg by mouth daily.   CRANBERRY-VITAMIN C-PROBIOTIC (AZO CRANBERRY PO)    Take 2 tablets by mouth daily.   DOCUSATE SODIUM  (COLACE) 100 MG CAPSULE    Take 100 mg by mouth daily.   DOCUSATE SODIUM  (COLACE) 100 MG CAPSULE    Take 1 capsule (100 mg total) by mouth 2 (two) times daily. To keep stools soft   EMPAGLIFLOZIN (JARDIANCE) 10 MG TABS TABLET    Take 10 mg by mouth daily.   EMTRICITABINE-TENOFOVIR (TRUVADA) 200-300 MG TABLET    Take 1 tablet by mouth daily.   FENOFIBRATE 54 MG TABLET    Take 54 mg by mouth daily.   GABAPENTIN  (NEURONTIN ) 300 MG CAPSULE    Take 600 mg by mouth daily.   GABAPENTIN  (NEURONTIN ) 300 MG CAPSULE    Take 1 capsule (300 mg total) by mouth at bedtime. PRN   HYDROCODONE -ACETAMINOPHEN  (NORCO/VICODIN) 5-325 MG TABLET    Take 1-2 tablets by mouth every  6 (six) hours as needed.   LANSOPRAZOLE (PREVACID) 15 MG CAPSULE    Take 15 mg by mouth daily.   NEEDLE, DISP, 18 G (B-D HYPODERMIC NEEDLE 18GX1.5) 18G X 1-1/2 MISC    Use q2 weeks as directed   OXYCODONE  (OXY IR/ROXICODONE ) 5 MG IMMEDIATE RELEASE TABLET    Take 1 tablet (5 mg total) by mouth every 4 (four) hours as needed for severe pain.   SPIRONOLACTONE (ALDACTONE) 25 MG TABLET    Take 25 mg by mouth daily.   SYMBICORT 160-4.5 MCG/ACT INHALER    Inhale 2 puffs into the lungs.   SYNTHROID 150 MCG TABLET    Take 150 mcg by mouth daily. 4 days per week; M-Th only   SYRINGE-NEEDLE, DISP, 3 ML (B-D 3CC LUER-LOK SYR 25GX5/8) 25G X 5/8 3 ML MISC    USE TO INJECT TESTOSTERONE  EVERY 2 WEEKS AS DIRECTED   TESTOSTERONE  CYPIONATE (DEPOTESTOSTERONE CYPIONATE) 200 MG/ML INJECTION    Inject 0.5 mLs into the muscle every 14 (fourteen) days.   VITAMIN D PO    Take 2,000 Units by mouth daily.  Modified Medications   No medications on file  Discontinued Medications   No medications on file  Allergies: Allergies  Allergen Reactions   Diclofenac  Other (See Comments)    Gas , nauseated , headache    Molnupiravir Hives   Nsaids     Due to Kidney disease    Labs: Lab Results  Component Value Date   HIV1RNAQUANT NOT DETECTED 07/18/2023   HIV1RNAQUANT NOT DETECTED 05/09/2023   HIV1RNAQUANT Not Detected 03/14/2023    RPR and STI Lab Results  Component Value Date   LABRPR NON-REACTIVE 07/18/2023   LABRPR NON-REACTIVE 05/09/2023   LABRPR NON-REACTIVE 03/14/2023   LABRPR NON-REACTIVE 02/14/2023    STI Results GC CT  07/18/2023  3:45 PM Negative    Negative  Negative    Negative   02/14/2023  3:58 PM Negative  Negative     Hepatitis B Lab Results  Component Value Date   HEPBSAG NON-REACTIVE 02/14/2023   Hepatitis C No results found for: HEPCAB, HCVRNAPCRQN Hepatitis A Lab Results  Component Value Date   HAV REACTIVE (A) 02/14/2023   Lipids: No results found for: CHOL,  TRIG, HDL, CHOLHDL, VLDL, LDLCALC  TARGET DATE: 27  Assessment: Destiny Wright presents today for his Apretude  injection and to follow up for HIV PrEP. No issues with past injections. Denies any symptoms of acute HIV. Last HIV RNA was negative on 07/18/23.  Last STI screening was on 07/18/23 and was negative. No known exposures to any STIs since last visit. Agrees to STI testing today.  He completed his Hepatitis B vaccine series 03/14/23. Will check Hepatitis B antibody today to assess immunity. Discussed the patient is eligible for a pneumococcal vaccine; he would like to look into the cost and his insurance coverage before getting the vaccine.   Per [employer] guidelines, a rapid HIV test should be drawn prior to Apretude  administration. Due to state shortage of rapid HIV tests, this is temporarily unable to be done. Per decision from [facility] physicians, we will proceed with Apretude  administration at this time without a negative rapid HIV test beforehand. HIV RNA was collected today and is in process.  Administered cabotegravir  600mg /20mL in right upper outer quadrant of the gluteal muscle. Will see Destiny Wright in 2 months for injection, labs, and HIV PrEP follow up.  Plan: - Apretude  injection administered - HIV RNA today - STI testing today - HepB surface antibody - Next injection, labs, and PrEP follow up appointment scheduled for 11/08/23 - Call with any issues or questions  Destiny Wright, PharmD PGY1 Clinical Pharmacist [facility]  09/13/2023 4:19 PM

## 2023-09-13 ENCOUNTER — Other Ambulatory Visit: Payer: Self-pay

## 2023-09-13 ENCOUNTER — Other Ambulatory Visit (HOSPITAL_COMMUNITY)
Admission: RE | Admit: 2023-09-13 | Discharge: 2023-09-13 | Disposition: A | Discharge status: Home / Self Care | Source: Ambulatory Visit | Attending: Infectious Disease | Admitting: Infectious Disease

## 2023-09-13 ENCOUNTER — Ambulatory Visit: Admitting: Pharmacist

## 2023-09-13 DIAGNOSIS — Z113 Encounter for screening for infections with a predominantly sexual mode of transmission: Secondary | ICD-10-CM | POA: Insufficient documentation

## 2023-09-13 DIAGNOSIS — Z114 Encounter for screening for human immunodeficiency virus [HIV]: Secondary | ICD-10-CM

## 2023-09-13 DIAGNOSIS — Z23 Encounter for immunization: Secondary | ICD-10-CM

## 2023-09-13 DIAGNOSIS — Z2981 Encounter for HIV pre-exposure prophylaxis: Secondary | ICD-10-CM

## 2023-09-13 MED ORDER — CABOTEGRAVIR ER 600 MG/3ML IM SUER
600.0000 mg | Freq: Once | INTRAMUSCULAR | Status: AC
  Administered 2023-09-13: 600 mg via INTRAMUSCULAR

## 2023-09-15 LAB — HEPATITIS B SURFACE ANTIBODY,QUALITATIVE: Hep B S Ab: REACTIVE — AB

## 2023-09-15 LAB — URINE CYTOLOGY ANCILLARY ONLY
Chlamydia: NEGATIVE
Comment: NEGATIVE
Comment: NORMAL
Neisseria Gonorrhea: NEGATIVE

## 2023-09-15 LAB — CYTOLOGY, (ORAL, ANAL, URETHRAL) ANCILLARY ONLY
Chlamydia: NEGATIVE
Comment: NEGATIVE
Comment: NORMAL
Neisseria Gonorrhea: NEGATIVE

## 2023-09-15 LAB — RPR: RPR Ser Ql: NONREACTIVE

## 2023-09-15 LAB — HIV-1 RNA QUANT-NO REFLEX-BLD
HIV 1 RNA Quant: NOT DETECTED {copies}/mL
HIV-1 RNA Quant, Log: NOT DETECTED {Log_copies}/mL

## 2023-10-22 ENCOUNTER — Encounter (HOSPITAL_COMMUNITY): Payer: Self-pay

## 2023-10-22 ENCOUNTER — Emergency Department (HOSPITAL_COMMUNITY)

## 2023-10-22 ENCOUNTER — Emergency Department (HOSPITAL_COMMUNITY)
Admission: EM | Admit: 2023-10-22 | Discharge: 2023-10-22 | Disposition: A | Discharge status: Home / Self Care | Attending: Emergency Medicine | Admitting: Emergency Medicine

## 2023-10-22 ENCOUNTER — Other Ambulatory Visit: Payer: Self-pay

## 2023-10-22 DIAGNOSIS — R7401 Elevation of levels of liver transaminase levels: Secondary | ICD-10-CM | POA: Diagnosis not present

## 2023-10-22 DIAGNOSIS — S40011A Contusion of right shoulder, initial encounter: Secondary | ICD-10-CM | POA: Diagnosis not present

## 2023-10-22 DIAGNOSIS — T1490XA Injury, unspecified, initial encounter: Secondary | ICD-10-CM

## 2023-10-22 DIAGNOSIS — S4291XA Fracture of right shoulder girdle, part unspecified, initial encounter for closed fracture: Secondary | ICD-10-CM

## 2023-10-22 DIAGNOSIS — S0083XA Contusion of other part of head, initial encounter: Secondary | ICD-10-CM | POA: Diagnosis not present

## 2023-10-22 DIAGNOSIS — R739 Hyperglycemia, unspecified: Secondary | ICD-10-CM | POA: Insufficient documentation

## 2023-10-22 DIAGNOSIS — S43014A Anterior dislocation of right humerus, initial encounter: Secondary | ICD-10-CM | POA: Diagnosis not present

## 2023-10-22 DIAGNOSIS — N189 Chronic kidney disease, unspecified: Secondary | ICD-10-CM | POA: Insufficient documentation

## 2023-10-22 DIAGNOSIS — Y92410 Unspecified street and highway as the place of occurrence of the external cause: Secondary | ICD-10-CM | POA: Insufficient documentation

## 2023-10-22 DIAGNOSIS — M25551 Pain in right hip: Secondary | ICD-10-CM | POA: Diagnosis not present

## 2023-10-22 DIAGNOSIS — S43304A Dislocation of unspecified parts of right shoulder girdle, initial encounter: Secondary | ICD-10-CM | POA: Diagnosis not present

## 2023-10-22 DIAGNOSIS — T07XXXA Unspecified multiple injuries, initial encounter: Secondary | ICD-10-CM

## 2023-10-22 DIAGNOSIS — M25511 Pain in right shoulder: Secondary | ICD-10-CM | POA: Diagnosis present

## 2023-10-22 DIAGNOSIS — R748 Abnormal levels of other serum enzymes: Secondary | ICD-10-CM

## 2023-10-22 LAB — CBC
HCT: 44.9 % (ref 36.0–46.0)
Hemoglobin: 14.9 g/dL (ref 12.0–15.0)
MCH: 31.8 pg (ref 26.0–34.0)
MCHC: 33.2 g/dL (ref 30.0–36.0)
MCV: 95.9 fL (ref 80.0–100.0)
Platelets: 190 K/uL (ref 150–400)
RBC: 4.68 MIL/uL (ref 3.87–5.11)
RDW: 12.7 % (ref 11.5–15.5)
WBC: 8.7 K/uL (ref 4.0–10.5)
nRBC: 0 % (ref 0.0–0.2)

## 2023-10-22 LAB — COMPREHENSIVE METABOLIC PANEL WITH GFR
ALT: 56 U/L — ABNORMAL HIGH (ref 0–44)
AST: 71 U/L — ABNORMAL HIGH (ref 15–41)
Albumin: 3.8 g/dL (ref 3.5–5.0)
Alkaline Phosphatase: 72 U/L (ref 38–126)
Anion gap: 12 (ref 5–15)
BUN: 22 mg/dL — ABNORMAL HIGH (ref 6–20)
CO2: 19 mmol/L — ABNORMAL LOW (ref 22–32)
Calcium: 9.2 mg/dL (ref 8.9–10.3)
Chloride: 105 mmol/L (ref 98–111)
Creatinine, Ser: 1.89 mg/dL — ABNORMAL HIGH (ref 0.44–1.00)
GFR, Estimated: 31 mL/min — ABNORMAL LOW (ref >60)
Glucose, Bld: 217 mg/dL — ABNORMAL HIGH (ref 70–99)
Potassium: 4.7 mmol/L (ref 3.5–5.1)
Sodium: 136 mmol/L (ref 135–145)
Total Bilirubin: 1.1 mg/dL (ref 0.0–1.2)
Total Protein: 7.2 g/dL (ref 6.5–8.1)

## 2023-10-22 MED ORDER — PROPOFOL 10 MG/ML IV BOLUS
INTRAVENOUS | Status: AC | PRN
  Administered 2023-10-22 (×3): 50 mg via INTRAVENOUS

## 2023-10-22 MED ORDER — PROPOFOL 10 MG/ML IV BOLUS
0.5000 mg/kg | Freq: Once | INTRAVENOUS | Status: AC
  Administered 2023-10-22: 49.5 mg via INTRAVENOUS
  Filled 2023-10-22: qty 20

## 2023-10-22 MED ORDER — FENTANYL CITRATE PF 50 MCG/ML IJ SOSY
PREFILLED_SYRINGE | INTRAMUSCULAR | Status: AC
  Administered 2023-10-22: 50 ug
  Filled 2023-10-22: qty 1

## 2023-10-22 MED ORDER — MORPHINE SULFATE (PF) 2 MG/ML IV SOLN
INTRAVENOUS | Status: AC
  Administered 2023-10-22: 2 mg via INTRAVENOUS
  Filled 2023-10-22: qty 1
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
## 2023-10-22 NOTE — Discharge Instructions (Addendum)
 Please keep shoulder immobilizer in place Use hydrocodone  as needed for pain Follow-up for recheck of liver enzymes and renal function with your primary care doctor next week. Return if you are having any new or worsening symptoms
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
## 2023-10-22 NOTE — Procedures (Signed)
 The correct side of procedure was noted.  The patient was consciously sedated.  Close reduction was performed.  Patient was placed in a sling made nonweightbearing

## 2023-10-22 NOTE — Progress Notes (Signed)
 Orthopedic Tech Progress Note Patient Details:  Destiny Wright 05/24/1965 [id number] Level 2 Trauma  Patient ID: Destiny Wright, adult   DOB: 11/08/1965, 58 y.o.   MRN: [id number]  Destiny Wright Destiny Wright Destiny Wright 10/22/2023, 1:27 PM
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
## 2023-10-25 ENCOUNTER — Telehealth: Payer: Self-pay | Admitting: Pharmacist

## 2023-10-25 ENCOUNTER — Telehealth: Payer: Self-pay | Admitting: Orthopaedic Surgery
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
## 2023-10-25 NOTE — Telephone Encounter (Signed)
 Patient has question about her shoulder that Dr B put back in place in the ED On 10/6

## 2023-10-26 ENCOUNTER — Ambulatory Visit (HOSPITAL_BASED_OUTPATIENT_CLINIC_OR_DEPARTMENT_OTHER)

## 2023-10-26 ENCOUNTER — Ambulatory Visit (INDEPENDENT_AMBULATORY_CARE_PROVIDER_SITE_OTHER): Admitting: Student

## 2023-10-26 DIAGNOSIS — M25511 Pain in right shoulder: Secondary | ICD-10-CM

## 2023-10-26 DIAGNOSIS — G8929 Other chronic pain: Secondary | ICD-10-CM

## 2023-10-26 DIAGNOSIS — S4291XA Fracture of right shoulder girdle, part unspecified, initial encounter for closed fracture: Secondary | ICD-10-CM
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
## 2023-10-28 ENCOUNTER — Ambulatory Visit
Admission: RE | Admit: 2023-10-28 | Discharge: 2023-10-28 | Disposition: A | Discharge status: Home / Self Care | Source: Ambulatory Visit | Attending: Student | Admitting: Student

## 2023-10-28 ENCOUNTER — Other Ambulatory Visit: Payer: Self-pay

## 2023-10-28 DIAGNOSIS — S4291XA Fracture of right shoulder girdle, part unspecified, initial encounter for closed fracture: Secondary | ICD-10-CM | POA: Diagnosis present

## 2023-10-28 NOTE — Progress Notes (Signed)
 Pt will be going back on tablets

## 2023-11-03 ENCOUNTER — Other Ambulatory Visit: Payer: Self-pay

## 2023-11-03 ENCOUNTER — Other Ambulatory Visit (HOSPITAL_COMMUNITY): Payer: Self-pay

## 2023-11-04 ENCOUNTER — Ambulatory Visit (INDEPENDENT_AMBULATORY_CARE_PROVIDER_SITE_OTHER): Admitting: Orthopaedic Surgery

## 2023-11-04 ENCOUNTER — Other Ambulatory Visit: Payer: Self-pay

## 2023-11-04 ENCOUNTER — Ambulatory Visit (HOSPITAL_BASED_OUTPATIENT_CLINIC_OR_DEPARTMENT_OTHER): Payer: Self-pay | Admitting: Orthopaedic Surgery

## 2023-11-04 DIAGNOSIS — S4291XA Fracture of right shoulder girdle, part unspecified, initial encounter for closed fracture: Secondary | ICD-10-CM
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
## 2023-11-07 ENCOUNTER — Telehealth: Payer: Self-pay

## 2023-11-07 NOTE — Telephone Encounter (Signed)
 [facility] Patient Advocate Encounter  Patient's medications Apretude have been couriered to [facility] from [facility] and will be administered at the patients appointment.  Clearance Coots, CPhT Specialty Pharmacy Patient [facility] Phone: [phone] Fax:  [phone]

## 2023-11-08 ENCOUNTER — Telehealth (HOSPITAL_BASED_OUTPATIENT_CLINIC_OR_DEPARTMENT_OTHER): Payer: Self-pay | Admitting: Orthopaedic Surgery

## 2023-11-08 ENCOUNTER — Ambulatory Visit: Admitting: Pharmacist

## 2023-11-08 NOTE — Telephone Encounter (Signed)
 Patient wants to know if his surgery has been set. I advised that April would call to get this set up

## 2023-11-10 ENCOUNTER — Encounter (HOSPITAL_BASED_OUTPATIENT_CLINIC_OR_DEPARTMENT_OTHER): Payer: Self-pay | Admitting: Orthopaedic Surgery

## 2023-11-11 NOTE — Telephone Encounter (Signed)
 I spoke with the patient and scheduled his surgery for 11/15/23.

## 2023-11-11 NOTE — Telephone Encounter (Signed)
 Reason for call: Pt called me regarding upcoming shoulder surgery and questions regarding Jardiance  Pt called and is having shoulder surgery on Tuesday and has not been able to get in-touch with Provider to know when she will need to go off Jardiance 10 mg daily.  I was able to reach out to Dr. Diona and confirm. Pt was instructed to stop Jardiance 10 mg Saturday. Last dose should be taken on Friday 11/11/2023.  Pt will have surgery on Tuesday 11/15/2023 and and can resume Jardiance 10 mg daily on Thursday Oct 30th.  Pt thanked me for the assistance and was able to verbalize instructions.    Marval Hussar RN, [facility]  Pharmacy Department [facility] [address]  [place], [place] [address] [phone]

## 2023-11-14 ENCOUNTER — Encounter (HOSPITAL_COMMUNITY): Payer: Self-pay | Admitting: Orthopaedic Surgery

## 2023-11-14 ENCOUNTER — Other Ambulatory Visit: Payer: Self-pay

## 2023-11-14 NOTE — Progress Notes (Signed)
 [facility] CALL  Patient was given pre-op instructions over the phone. The opportunity was given for the patient to ask questions. No further questions asked. Patient verbalized understanding of instructions given.   PCP - Dr. Alm Needle at [facility] - no longer sees a cardiologist  Pulmonology - Dr. Aleskrov - [facility] - 11/07/23  PPM/ICD - denies Device Orders - n/a Rep Notified - n/a  Chest x-ray - 10/22/23 EKG - 06/22/22 - repeat DOS Stress Test - denies ECHO - 10/25/2006 Cardiac Cath - denies  Sleep Study - patient reports that they recently had a sleep study completed and it resulted as very mild sleep apnea.  Patient reports that he does not need to wear a cpap CPAP - dnies  NO DM  Last dose of GLP1 agonist-  n/a GLP1 instructions:  n/a  Last dose of Jardiance was 10/24  Blood Thinner Instructions: n/a Aspirin Instructions: n/a  ERAS Protcol - clears until 1345 PRE-SURGERY Ensure or G2-  n/a  COVID TEST- n/a   Anesthesia review: yes - heart murmur, Jardiance  Patient denies shortness of breath, fever, cough and chest pain over the phone call   All instructions explained to the patient, with a verbal understanding of the material. Patient agrees to go over the instructions while at home for a better understanding.

## 2023-11-14 NOTE — Telephone Encounter (Signed)
 I spoke with the patient on 11/11/23 and scheduled her for surgery on 11/15/23.
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
## 2023-11-14 NOTE — Anesthesia Preprocedure Evaluation (Signed)
 Anesthesia Evaluation  Patient identified by MRN, date of birth, ID band Patient awake    Reviewed: Allergy & Precautions, H&P , NPO status , Patient's Chart, lab work & pertinent test results  History of Anesthesia Complications (+) PONV and history of anesthetic complications  Airway Mallampati: II  TM Distance: >3 FB Neck ROM: Full    Dental no notable dental hx.    Pulmonary asthma , COPD,  COPD inhaler, Patient abstained from smoking., former smoker   Pulmonary exam normal        Cardiovascular hypertension, Pt. on medications  Rhythm:Regular Rate:Normal     Neuro/Psych   Anxiety Depression    negative neurological ROS     GI/Hepatic Neg liver ROS,GERD  Medicated,,  Endo/Other  Hypothyroidism    Renal/GU Renal InsufficiencyRenal disease  negative genitourinary   Musculoskeletal  (+) Arthritis ,    Abdominal   Peds  Hematology negative hematology ROS (+)   Anesthesia Other Findings   Reproductive/Obstetrics negative OB ROS                              Anesthesia Physical Anesthesia Plan  ASA: 3  Anesthesia Plan: General   Post-op Pain Management: Regional block*   Induction: Intravenous  PONV Risk Score and Plan: 4 or greater and Ondansetron , Dexamethasone  and Midazolam   Airway Management Planned: Oral ETT  Additional Equipment:   Intra-op Plan:   Post-operative Plan: Extubation in OR  Informed Consent: I have reviewed the patients History and Physical, chart, labs and discussed the procedure including the risks, benefits and alternatives for the proposed anesthesia with the patient or authorized representative who has indicated his/her understanding and acceptance.     Dental advisory given  Plan Discussed with: CRNA  Anesthesia Plan Comments: (See PAT note written 11/14/2023 by Kriss Perleberg, PA-C.  )         Anesthesia Quick Evaluation

## 2023-11-15 ENCOUNTER — Other Ambulatory Visit: Payer: Self-pay

## 2023-11-15 ENCOUNTER — Ambulatory Visit (HOSPITAL_COMMUNITY): Admitting: Vascular Surgery

## 2023-11-15 ENCOUNTER — Encounter (HOSPITAL_COMMUNITY)
Admission: RE | Disposition: A | Discharge status: Home / Self Care | Payer: Self-pay | Source: Home / Self Care | Attending: Orthopaedic Surgery

## 2023-11-15 ENCOUNTER — Ambulatory Visit (HOSPITAL_COMMUNITY)

## 2023-11-15 ENCOUNTER — Encounter (HOSPITAL_COMMUNITY): Payer: Self-pay | Admitting: Orthopaedic Surgery

## 2023-11-15 ENCOUNTER — Ambulatory Visit (HOSPITAL_BASED_OUTPATIENT_CLINIC_OR_DEPARTMENT_OTHER): Admitting: Vascular Surgery

## 2023-11-15 ENCOUNTER — Ambulatory Visit (HOSPITAL_COMMUNITY)
Admission: RE | Admit: 2023-11-15 | Discharge: 2023-11-15 | Disposition: A | Discharge status: Home / Self Care | Attending: Orthopaedic Surgery | Admitting: Orthopaedic Surgery

## 2023-11-15 DIAGNOSIS — Z79890 Hormone replacement therapy (postmenopausal): Secondary | ICD-10-CM | POA: Diagnosis not present

## 2023-11-15 DIAGNOSIS — K760 Fatty (change of) liver, not elsewhere classified: Secondary | ICD-10-CM | POA: Diagnosis not present

## 2023-11-15 DIAGNOSIS — J4489 Other specified chronic obstructive pulmonary disease: Secondary | ICD-10-CM | POA: Diagnosis not present

## 2023-11-15 DIAGNOSIS — K219 Gastro-esophageal reflux disease without esophagitis: Secondary | ICD-10-CM | POA: Insufficient documentation

## 2023-11-15 DIAGNOSIS — E039 Hypothyroidism, unspecified: Secondary | ICD-10-CM | POA: Insufficient documentation

## 2023-11-15 DIAGNOSIS — F640 Transsexualism: Secondary | ICD-10-CM | POA: Insufficient documentation

## 2023-11-15 DIAGNOSIS — F419 Anxiety disorder, unspecified: Secondary | ICD-10-CM | POA: Diagnosis not present

## 2023-11-15 DIAGNOSIS — I10 Essential (primary) hypertension: Secondary | ICD-10-CM | POA: Diagnosis not present

## 2023-11-15 DIAGNOSIS — S42251D Displaced fracture of greater tuberosity of right humerus, subsequent encounter for fracture with routine healing: Secondary | ICD-10-CM | POA: Diagnosis not present

## 2023-11-15 DIAGNOSIS — S42251A Displaced fracture of greater tuberosity of right humerus, initial encounter for closed fracture: Secondary | ICD-10-CM

## 2023-11-15 DIAGNOSIS — Z90722 Acquired absence of ovaries, bilateral: Secondary | ICD-10-CM | POA: Diagnosis not present

## 2023-11-15 DIAGNOSIS — Z90710 Acquired absence of both cervix and uterus: Secondary | ICD-10-CM | POA: Diagnosis not present

## 2023-11-15 DIAGNOSIS — Z87891 Personal history of nicotine dependence: Secondary | ICD-10-CM

## 2023-11-15 DIAGNOSIS — S4291XA Fracture of right shoulder girdle, part unspecified, initial encounter for closed fracture: Secondary | ICD-10-CM

## 2023-11-15 DIAGNOSIS — Z9013 Acquired absence of bilateral breasts and nipples: Secondary | ICD-10-CM | POA: Diagnosis not present

## 2023-11-15 DIAGNOSIS — N1832 Chronic kidney disease, stage 3b: Secondary | ICD-10-CM | POA: Insufficient documentation

## 2023-11-15 DIAGNOSIS — I1310 Hypertensive heart and chronic kidney disease without heart failure, with stage 1 through stage 4 chronic kidney disease, or unspecified chronic kidney disease: Secondary | ICD-10-CM | POA: Diagnosis not present

## 2023-11-15 DIAGNOSIS — Z79899 Other long term (current) drug therapy: Secondary | ICD-10-CM | POA: Diagnosis not present

## 2023-11-15 DIAGNOSIS — J449 Chronic obstructive pulmonary disease, unspecified: Secondary | ICD-10-CM

## 2023-11-15 DIAGNOSIS — X58XXXA Exposure to other specified factors, initial encounter: Secondary | ICD-10-CM | POA: Diagnosis not present

## 2023-11-15 HISTORY — PX: ORIF SHOULDER FRACTURE: SHX5035

## 2023-11-15 SURGERY — OPEN REDUCTION INTERNAL FIXATION (ORIF) SHOULDER FRACTURE
Anesthesia: General | Site: Shoulder | Laterality: Right

## 2023-11-15 MED ORDER — SUGAMMADEX SODIUM 200 MG/2ML IV SOLN
INTRAVENOUS | Status: DC | PRN
  Administered 2023-11-15: 189.6 mg via INTRAVENOUS

## 2023-11-15 MED ORDER — DEXAMETHASONE SOD PHOSPHATE PF 10 MG/ML IJ SOLN
INTRAMUSCULAR | Status: DC | PRN
  Administered 2023-11-15: 4 mg via INTRAVENOUS

## 2023-11-15 MED ORDER — PHENYLEPHRINE HCL-NACL 20-0.9 MG/250ML-% IV SOLN
INTRAVENOUS | Status: DC | PRN
  Administered 2023-11-15: 45 ug/min via INTRAVENOUS

## 2023-11-15 MED ORDER — LIDOCAINE 2% (20 MG/ML) 5 ML SYRINGE
INTRAMUSCULAR | Status: DC | PRN
  Administered 2023-11-15: 100 mg via INTRAVENOUS

## 2023-11-15 MED ORDER — PROPOFOL 10 MG/ML IV BOLUS
INTRAVENOUS | Status: DC | PRN
  Administered 2023-11-15: 160 mg via INTRAVENOUS

## 2023-11-15 MED ORDER — BUPIVACAINE-EPINEPHRINE (PF) 0.5% -1:200000 IJ SOLN
INTRAMUSCULAR | Status: DC | PRN
  Administered 2023-11-15: 15 mL via PERINEURAL

## 2023-11-15 MED ORDER — FENTANYL CITRATE (PF) 250 MCG/5ML IJ SOLN
INTRAMUSCULAR | Status: AC
  Filled 2023-11-15: qty 5

## 2023-11-15 MED ORDER — ORAL CARE MOUTH RINSE: 15.0000 mL | Freq: Once | OROMUCOSAL | Status: AC

## 2023-11-15 MED ORDER — ROCURONIUM BROMIDE 10 MG/ML (PF) SYRINGE
PREFILLED_SYRINGE | INTRAVENOUS | Status: DC | PRN
  Administered 2023-11-15: 70 mg via INTRAVENOUS

## 2023-11-15 MED ORDER — VANCOMYCIN HCL 1000 MG IV SOLR
INTRAVENOUS | Status: AC
  Filled 2023-11-15: qty 20

## 2023-11-15 MED ORDER — CEFAZOLIN SODIUM-DEXTROSE 2-4 GM/100ML-% IV SOLN
2.0000 g | INTRAVENOUS | Status: AC
  Administered 2023-11-15: 2 g via INTRAVENOUS
  Filled 2023-11-15: qty 100

## 2023-11-15 MED ORDER — BUPIVACAINE LIPOSOME 1.3 % IJ SUSP
INTRAMUSCULAR | Status: DC | PRN
  Administered 2023-11-15: 10 mL via PERINEURAL

## 2023-11-15 MED ORDER — ASPIRIN 325 MG PO TBEC: 325.0000 mg | DELAYED_RELEASE_TABLET | Freq: Every day | ORAL | 0 refills | Status: AC

## 2023-11-15 MED ORDER — FENTANYL CITRATE (PF) 100 MCG/2ML IJ SOLN: 50.0000 ug | Freq: Once | INTRAMUSCULAR | Status: AC

## 2023-11-15 MED ORDER — MIDAZOLAM HCL 2 MG/2ML IJ SOLN
INTRAMUSCULAR | Status: AC
  Administered 2023-11-15: 2 mg via INTRAVENOUS
  Filled 2023-11-15: qty 2

## 2023-11-15 MED ORDER — MIDAZOLAM HCL (PF) 2 MG/2ML IJ SOLN: 2.0000 mg | Freq: Once | INTRAMUSCULAR | Status: AC

## 2023-11-15 MED ORDER — ACETAMINOPHEN 500 MG PO TABS
1000.0000 mg | ORAL_TABLET | Freq: Once | ORAL | Status: AC
  Administered 2023-11-15: 1000 mg via ORAL
  Filled 2023-11-15: qty 2

## 2023-11-15 MED ORDER — HYDROCODONE-ACETAMINOPHEN 5-325 MG PO TABS: 1.0000 | ORAL_TABLET | ORAL | 0 refills | Status: AC | PRN

## 2023-11-15 MED ORDER — LACTATED RINGERS IV SOLN: INTRAVENOUS | Status: DC

## 2023-11-15 MED ORDER — TRANEXAMIC ACID-NACL 1000-0.7 MG/100ML-% IV SOLN
1000.0000 mg | INTRAVENOUS | Status: AC
  Administered 2023-11-15: 1000 mg via INTRAVENOUS
  Filled 2023-11-15: qty 100

## 2023-11-15 MED ORDER — MIDAZOLAM HCL 2 MG/2ML IJ SOLN
INTRAMUSCULAR | Status: AC
  Filled 2023-11-15: qty 2

## 2023-11-15 MED ORDER — GABAPENTIN 300 MG PO CAPS
300.0000 mg | ORAL_CAPSULE | Freq: Once | ORAL | Status: AC
  Administered 2023-11-15: 300 mg via ORAL
  Filled 2023-11-15: qty 1

## 2023-11-15 MED ORDER — CHLORHEXIDINE GLUCONATE 0.12 % MT SOLN
15.0000 mL | Freq: Once | OROMUCOSAL | Status: AC
  Administered 2023-11-15: 15 mL via OROMUCOSAL
  Filled 2023-11-15: qty 15

## 2023-11-15 MED ORDER — HYDROMORPHONE HCL 1 MG/ML IJ SOLN: 0.2500 mg | INTRAMUSCULAR | Status: DC | PRN

## 2023-11-15 MED ORDER — PROPOFOL 10 MG/ML IV BOLUS
INTRAVENOUS | Status: AC
  Filled 2023-11-15: qty 20

## 2023-11-15 MED ORDER — MIDAZOLAM HCL (PF) 2 MG/2ML IJ SOLN
INTRAMUSCULAR | Status: DC | PRN
  Administered 2023-11-15: 2 mg via INTRAVENOUS

## 2023-11-15 MED ORDER — ALBUMIN HUMAN 5 % IV SOLN: INTRAVENOUS | Status: DC | PRN

## 2023-11-15 MED ORDER — FENTANYL CITRATE (PF) 100 MCG/2ML IJ SOLN
INTRAMUSCULAR | Status: AC
  Administered 2023-11-15: 50 ug via INTRAVENOUS
  Filled 2023-11-15: qty 2

## 2023-11-15 SURGICAL SUPPLY — 43 items
ANCHOR SUT QUATTRO KNTLS 4.5 (Anchor) IMPLANT
BAG COUNTER SPONGE SURGICOUNT (BAG) ×1 IMPLANT
BIT DRILL 2.9 CANN QC NONSTRL (BIT) IMPLANT
BNDG ELASTIC 6INX 5YD STR LF (GAUZE/BANDAGES/DRESSINGS) ×1 IMPLANT
BRUSH SCRUB EZ PLAIN DRY (MISCELLANEOUS) ×2 IMPLANT
CHLORAPREP W/TINT 26 (MISCELLANEOUS) ×1 IMPLANT
COVER SURGICAL LIGHT HANDLE (MISCELLANEOUS) ×2 IMPLANT
DRAPE C-ARM 42X72 X-RAY (DRAPES) ×1 IMPLANT
DRAPE HALF SHEET 40X57 (DRAPES) ×1 IMPLANT
DRAPE SURG 17X23 STRL (DRAPES) ×2 IMPLANT
DRAPE U-SHAPE 47X51 STRL (DRAPES) ×2 IMPLANT
DRSG MEPILEX POST OP 4X8 (GAUZE/BANDAGES/DRESSINGS) ×1 IMPLANT
ELECTRODE REM PT RTRN 9FT ADLT (ELECTROSURGICAL) ×1 IMPLANT
GLOVE BIO SURGEON STRL SZ 6.5 (GLOVE) ×3 IMPLANT
GLOVE BIO SURGEON STRL SZ7.5 (GLOVE) ×3 IMPLANT
GLOVE BIOGEL PI IND STRL 6.5 (GLOVE) ×1 IMPLANT
GLOVE BIOGEL PI IND STRL 7.5 (GLOVE) ×1 IMPLANT
GOWN STRL REUS W/ TWL LRG LVL3 (GOWN DISPOSABLE) ×2 IMPLANT
KIT BASIN OR (CUSTOM PROCEDURE TRAY) ×1 IMPLANT
KIT STABILIZATION SHOULDER (MISCELLANEOUS) IMPLANT
KIT TURNOVER KIT B (KITS) ×1 IMPLANT
KWIRE 2X5 SS THRDED S3 (WIRE) IMPLANT
KWIRE ACE 1.6X6 (WIRE) IMPLANT
MANIFOLD NEPTUNE II (INSTRUMENTS) ×1 IMPLANT
NDL SUT .5 MAYO 1.404X.05X (NEEDLE) IMPLANT
PACK SHOULDER (CUSTOM PROCEDURE TRAY) ×1 IMPLANT
PAD ARMBOARD POSITIONER FOAM (MISCELLANEOUS) ×2 IMPLANT
SCREW ACE CAN 4.0 46M (Screw) IMPLANT
SCREW ACE CAN 4.0 50M (Screw) IMPLANT
SOLN 0.9% NACL POUR BTL 1000ML (IV SOLUTION) ×1 IMPLANT
SOLN STERILE WATER BTL 1000 ML (IV SOLUTION) ×1 IMPLANT
SPONGE T-LAP 18X18 ~~LOC~~+RFID (SPONGE) ×1 IMPLANT
STAPLER SKIN PROX 35W (STAPLE) ×1 IMPLANT
SUCTION TUBE FRAZIER 10FR DISP (SUCTIONS) ×1 IMPLANT
SUT ACTIVBRAID 2 1/2 TP BL NDL (SUTURE) IMPLANT
SUT BROADBAND TAPE 2PK 1.5 (SUTURE) IMPLANT
SUT MNCRL AB 3-0 PS2 18 (SUTURE) ×1 IMPLANT
SUT VIC AB 0 CT1 27XBRD ANBCTR (SUTURE) ×2 IMPLANT
SUT VIC AB 2-0 CT1 TAPERPNT 27 (SUTURE) ×2 IMPLANT
SUTURE FIBERWR #2 38 T-5 BLUE (SUTURE) ×2 IMPLANT
TOWEL GREEN STERILE (TOWEL DISPOSABLE) ×2 IMPLANT
TRAY FOLEY MTR SLVR 16FR STAT (SET/KITS/TRAYS/PACK) IMPLANT
WASHER PLAIN FLAT ACE NS 3PK (Orthopedic Implant) IMPLANT

## 2023-11-15 NOTE — Anesthesia Procedure Notes (Signed)
 Anesthesia Regional Block: Interscalene brachial plexus block   Pre-Anesthetic Checklist: , timeout performed,  Correct Patient, Correct Site, Correct Laterality,  Correct Procedure, Correct Position, site marked,  Risks and benefits discussed,  Surgical consent,  Pre-op evaluation,  At surgeon's request and post-op pain management  Laterality: Right  Prep: chloraprep       Needles:  Injection technique: Single-shot  Needle Type: Echogenic Needle     Needle Length: 9cm  Needle Gauge: 21     Additional Needles:   Procedures:,,,, ultrasound used (permanent image in chart),,    Narrative:  Start time: 11/15/2023 4:16 PM End time: 11/15/2023 4:22 PM Injection made incrementally with aspirations every 5 mL.  Performed by: Personally  Anesthesiologist: Epifanio Charleston, MD

## 2023-11-15 NOTE — Brief Op Note (Signed)
   Brief Op Note  Date of Surgery: 11/15/2023  Preoperative Diagnosis: RIGHT GREATER TUBEROSITY FRACTURE  Postoperative Diagnosis: same  Procedure: Procedure(s): OPEN REDUCTION INTERNAL FIXATION (ORIF) SHOULDER FRACTURE  Implants: Implant Name Type Inv. Item Serial No. Manufacturer Lot No. LRB No. Used Action  ANCHOR SUT QUATTRO KNTLS 4.5 - [id number] Anchor ANCHOR SUT QUATTRO KNTLS 4.5  ZIMMER [employer](ORTH,TRAU,BIO,SG) [id number] Right 1 Implanted  ANCHOR SUT QUATTRO KNTLS 4.5 - [id number] Anchor ANCHOR SUT QUATTRO KNTLS 4.5  ZIMMER RECON(ORTH,TRAU,BIO,SG) [id number] Right 1 Implanted  WASHER PLAIN FLAT ACE NS 3PK - [id number] Orthopedic Implant WASHER PLAIN FLAT ACE NS 3PK  ZIMMER RECON(ORTH,TRAU,BIO,SG)  Right 1 Implanted  SCREW ACE CAN 4.0 66M - [id number] Screw SCREW ACE CAN 4.0 66M  ZIMMER RECON(ORTH,TRAU,BIO,SG)  Right 1 Implanted  SCREW ACE CAN 4.0 55M - [id number] Screw SCREW ACE CAN 4.0 55M  ZIMMER RECON(ORTH,TRAU,BIO,SG)  Right 1 Implanted    Surgeons: Surgeon(s): Genelle Standing, MD  Anesthesia: General    Estimated Blood Loss: See anesthesia record  Complications: None  Condition to PACU: Stable  Standing LITTIE Genelle, MD 11/15/2023 6:37 PM

## 2023-11-15 NOTE — Discharge Instructions (Signed)
 Discharge Instructions    Attending Surgeon: Elspeth Parker, MD Office Phone Number: [phone]   Diagnosis and Procedures:    Surgeries Performed: Right shoulder proximal humerus open reduction internal fixation  Discharge Plan:    Diet: Resume usual diet. Begin with light or bland foods.  Drink plenty of fluids.  Activity:  Keep sling and dressing in place until your follow up visit in Physical Therapy You are advised to go home directly from the hospital or surgical center. Restrict your activities.  GENERAL INSTRUCTIONS: 1.  Keep your surgical site elevated above your heart for at least 5-7 days or longer to prevent swelling. This will improve your comfort and your overall recovery following surgery.     2. Please call Dr. Danetta office at [phone] with questions Monday-Friday during business hours. If no one answers, please leave a message and someone should get back to the patient within 24 hours. For emergencies please call 911 or proceed to the emergency room.   3. Patient to notify surgical team if experiences any of the following: Bowel/Bladder dysfunction, uncontrolled pain, nerve/muscle weakness, incision with increased drainage or redness, nausea/vomiting and Fever greater than 101.0 F.  Be alert for signs of infection including redness, streaking, odor, fever or chills. Be alert for excessive pain or bleeding and notify your surgeon immediately.  WOUND INSTRUCTIONS:   Leave your dressing/cast/splint in place until your post operative visit.  Keep it clean and dry.  Always keep the incision clean and dry until the staples/sutures are removed. If there is no drainage from the incision you should keep it open to air. If there is drainage from the incision you must keep it covered at all times until the drainage stops  Do not soak in a bath tub, hot tub, pool, lake or other body of water until 21 days after your surgery and your incision is completely dry  and healed.  If you have removable sutures (or staples) they must be removed 10-14 days (unless otherwise instructed) from the day of your surgery.     1)  Elevate the extremity as much as possible.  2)  Keep the dressing clean and dry.  3)  Please call us  if the dressing becomes wet or dirty.  4)  If you are experiencing worsening pain or worsening swelling, please call.     MEDICATIONS: Resume all previous home medications at the previous prescribed dose and frequency unless otherwise noted Start taking the  pain medications on an as-needed basis as prescribed  Please taper down pain medication over the next week following surgery.  Ideally you should not require a refill of any narcotic pain medication.  Take pain medication with food to minimize nausea. In addition to the prescribed pain medication, you may take over-the-counter pain relievers such as Tylenol .  Do NOT take additional tylenol  if your pain medication already has tylenol  in it.  Aspirin 325mg  daily per bottle instructions. Narcotic Policy: Per [facility] clinic policy, our goal is ensure optimal postoperative pain control with a multimodal pain management strategy. For all [facility] patients, our goal is to wean post-operative narcotic medications by 6 weeks post-operatively, and many times sooner. If this is not possible due to utilization of pain medication prior to surgery, your [facility] doctor will support your acute post-operative pain control for the first 6 weeks postoperatively, with a plan to transition you back to your primary pain team following that. Maralee will work to ensure a therapist, occupational.  FOLLOWUP INSTRUCTIONS: 1. Follow up at the Physical Therapy Clinic 3-4 days following surgery. This appointment should be scheduled unless other arrangements have been made.The Physical Therapy scheduling number is [phone] if an appointment has not already been arranged.  2. Contact Dr. Danetta office during  office hours at [phone] or the practice after hours line at [phone] for non-emergencies. For medical emergencies call 911.   Discharge Location: Home

## 2023-11-15 NOTE — Transfer of Care (Signed)
 Immediate Anesthesia Transfer of Care Note  Patient: Destiny Wright  Procedure(s) Performed: OPEN REDUCTION INTERNAL FIXATION (ORIF) SHOULDER FRACTURE (Right: Shoulder)  Patient Location: PACU  Anesthesia Type:GA combined with regional for post-op pain  Level of Consciousness: awake, alert , and oriented  Airway & Oxygen Therapy: Patient Spontanous Breathing  Post-op Assessment: Report given to RN and Post -op Vital signs reviewed and stable  Post vital signs: Reviewed and stable  Last Vitals:  Vitals Value Taken Time  BP 150/73 11/15/23 19:00  Temp 98   Pulse 69 11/15/23 19:02  Resp 18 11/15/23 19:02  SpO2 95 % 11/15/23 19:02  Vitals shown include unfiled device data.  Last Pain:  Vitals:   11/15/23 1635  PainSc: 0-No pain      Patients Stated Pain Goal: 3 (11/15/23 1519)  Complications: No notable events documented.

## 2023-11-15 NOTE — Interval H&P Note (Signed)
 History and Physical Interval Note:  11/15/2023 4:21 PM  Destiny Wright  has presented today for surgery, with the diagnosis of RIGHT GREATER TUBEROSITY FRACTURE.  The various methods of treatment have been discussed with the patient and family. After consideration of risks, benefits and other options for treatment, the patient has consented to  Procedure(s) with comments: OPEN REDUCTION INTERNAL FIXATION (ORIF) SHOULDER FRACTURE (Right) - RIGHT SHOULDER GREATER TUBEROSITY FRACTURE FIXATION as a surgical intervention.  The patient's history has been reviewed, patient examined, no change in status, stable for surgery.  I have reviewed the patient's chart and labs.  Questions were answered to the patient's satisfaction.     Reynard Christoffersen

## 2023-11-15 NOTE — Op Note (Signed)
 Date of Surgery: 11/15/2023  INDICATIONS: Destiny Wright is a 58 y.o.-year-old adult with right greater tuberosity fracture with significant displacement.  The risk and benefits of the procedure were discussed in detail and documented in the pre-operative evaluation.   PREOPERATIVE DIAGNOSIS: 1.  Right shoulder displaced greater tuberosity fracture  POSTOPERATIVE DIAGNOSIS: Same.  PROCEDURE: 1.  Open reduction internal fixation right greater tuberosity fracture  SURGEON: Elspeth LITTIE Parker MD  ASSISTANT: Conley Dawson, ATC  ANESTHESIA:  general  IV FLUIDS AND URINE: See anesthesia record.  ANTIBIOTICS: Ancef   ESTIMATED BLOOD LOSS: 25 mL.  IMPLANTS:  Implant Name Type Inv. Item Serial No. Manufacturer Lot No. LRB No. Used Action  ANCHOR SUT QUATTRO KNTLS 4.5 - [id number] Anchor ANCHOR SUT QUATTRO KNTLS 4.5  ZIMMER RECON(ORTH,TRAU,BIO,SG) [id number] Right 1 Implanted  ANCHOR SUT QUATTRO KNTLS 4.5 - [id number] Anchor ANCHOR SUT QUATTRO KNTLS 4.5  ZIMMER RECON(ORTH,TRAU,BIO,SG) [id number] Right 1 Implanted  WASHER PLAIN FLAT ACE NS 3PK - [id number] Orthopedic Implant WASHER PLAIN FLAT ACE NS 3PK  ZIMMER RECON(ORTH,TRAU,BIO,SG)  Right 1 Implanted  SCREW ACE CAN 4.0 8M - [id number] Screw SCREW ACE CAN 4.0 8M  ZIMMER RECON(ORTH,TRAU,BIO,SG)  Right 1 Implanted  SCREW ACE CAN 4.0 39M - [id number] Screw SCREW ACE CAN 4.0 39M  ZIMMER RECON(ORTH,TRAU,BIO,SG)  Right 1 Implanted    DRAINS: None  CULTURES: None  COMPLICATIONS: none  DESCRIPTION OF PROCEDURE:   I identified the patient in the pre-operative holding area.  I marked the operative right shoulder with my initials. I reviewed the risks and benefits of the proposed surgical intervention and the patient wished to proceed.  Anesthesia performed a peripheral nerve block.  Patient was subsequently taken back to the operating room.  The patient was transferred to the operative suite and placed in the beachchair position with all bony  prominences padded.      SCDs were placed lower extremities. Appropriate antibiotics was administered within 1 hour before incision. The operative lower extremity was then prepped and draped in standard fashion. A time out was performed confirming the correct extremity, correct patient and correct procedure.    I began with a lateral deltoid splitting approach off of the anterior border of the acromion.  15 blade was used to incise through skin.  Electrocautery was used to achieve hemostasis.  Care was taken not to split the deltoid 5 cm distally to the lateral aspect of the acromion to be respectful of the axillary nerve.  The raphae was identified and the muscle was split using electrocautery.  The axillary nerve was palpated and felt to be in continuity.  At this time the fracture was reduced using a Hohmann retractor.  A total of 4 #2 heavy sutures were then placed into the greater tuberosity fragment and bone junction using a free needle.  This was into the supraspinatus as well as the infraspinatus.  I used this to perform additional reduction of the fragment.  I was happy with the reduction and as result 2 cannulated wires were placed in order to place cannulated 4.0 screws again more so for temporary fixation while I fixed the suture repair.  The limbs of the supraspinatus and infraspinatus were then brought in a double row configuration into the anterior lateral and anterior lateral distal none fractured humerus to achieve compression at the distal aspect of the fragment.  Following this the shoulder was taken through range of motion and I was very happy with the fixation of the fragment.  There was a  mild amount of displacement on the x-ray although this was accepted due to the fragment clinically appearing well reduced and excellent fixation.  Wound was thoroughly irrigated and 1 g of vancomycin powder was placed   That concluded the case.  Skin was closed with 2-0 Vicryl and 3-0 nylon. Xeroform  gauze, gauze, Tegaderm, Iceman and brace were applied.  Instrument, sponge, and needle counts were correct prior to wound closure and at the conclusion of the case.  The patient was taken to the PACU without complication    POSTOPERATIVE PLAN: He will be nonweightbearing in a sling on the right arm.  He will begin physical therapy at 2 weeks postoperatively.  He will be placed on aspirin for blood clot prevention  Elspeth LITTIE Parker, MD 6:37 PM

## 2023-11-15 NOTE — Anesthesia Procedure Notes (Signed)
 Procedure Name: Intubation Date/Time: 11/15/2023 5:03 PM  Performed by: Lockie Flesher, CRNAPre-anesthesia Checklist: Patient identified, Emergency Drugs available, Suction available and Patient being monitored Patient Re-evaluated:Patient Re-evaluated prior to induction Oxygen Delivery Method: Circle System Utilized Preoxygenation: Pre-oxygenation with 100% oxygen Induction Type: IV induction Ventilation: Mask ventilation without difficulty Laryngoscope Size: Mac and 3 Grade View: Grade II Tube type: Oral Tube size: 7.0 mm Number of attempts: 1 Airway Equipment and Method: Stylet and Oral airway Placement Confirmation: ETT inserted through vocal cords under direct vision, positive ETCO2 and breath sounds checked- equal and bilateral Secured at: 21 cm Tube secured with: Tape Dental Injury: Teeth and Oropharynx as per pre-operative assessment
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
## 2023-11-16 ENCOUNTER — Ambulatory Visit (HOSPITAL_BASED_OUTPATIENT_CLINIC_OR_DEPARTMENT_OTHER): Admitting: Orthopaedic Surgery

## 2023-11-16 ENCOUNTER — Telehealth (HOSPITAL_BASED_OUTPATIENT_CLINIC_OR_DEPARTMENT_OTHER): Payer: Self-pay | Admitting: Orthopaedic Surgery

## 2023-11-16 ENCOUNTER — Encounter (HOSPITAL_COMMUNITY): Payer: Self-pay | Admitting: Orthopaedic Surgery

## 2023-11-16 NOTE — Anesthesia Postprocedure Evaluation (Signed)
 Anesthesia Post Note  Patient: Destiny Wright  Procedure(s) Performed: OPEN REDUCTION INTERNAL FIXATION (ORIF) SHOULDER FRACTURE (Right: Shoulder)     Patient location during evaluation: PACU Anesthesia Type: General and Regional Level of consciousness: awake and alert Pain management: pain level controlled Vital Signs Assessment: post-procedure vital signs reviewed and stable Respiratory status: spontaneous breathing, nonlabored ventilation, respiratory function stable and patient connected to nasal cannula oxygen Cardiovascular status: blood pressure returned to baseline and stable Postop Assessment: no apparent nausea or vomiting Anesthetic complications: no   No notable events documented.  Last Vitals:  Vitals:   11/15/23 1915 11/15/23 1930  BP: (!) 139/95 137/83  Pulse: 73 72  Resp: 13 17  Temp:  36.5 C  SpO2: 94% 95%    Last Pain:  Vitals:   11/15/23 1930  PainSc: 0-No pain   Pain Goal: Patients Stated Pain Goal: 3 (11/15/23 1519)                 Garnette DELENA Gab

## 2023-11-16 NOTE — Telephone Encounter (Signed)
 Patient has questions about his surgery patient  did not go into detail. Best contact [phone]

## 2023-11-16 NOTE — Telephone Encounter (Signed)
 Call disconnected

## 2023-11-21 ENCOUNTER — Encounter: Payer: Self-pay | Admitting: Radiology

## 2023-11-30 ENCOUNTER — Ambulatory Visit (INDEPENDENT_AMBULATORY_CARE_PROVIDER_SITE_OTHER)

## 2023-11-30 ENCOUNTER — Ambulatory Visit (INDEPENDENT_AMBULATORY_CARE_PROVIDER_SITE_OTHER): Admitting: Student

## 2023-11-30 DIAGNOSIS — S4291XA Fracture of right shoulder girdle, part unspecified, initial encounter for closed fracture: Secondary | ICD-10-CM

## 2023-11-30 NOTE — Progress Notes (Signed)
 Post Operative Evaluation    Procedure/Date of Surgery: Right greater tuberosity fracture ORIF 11/15/2023  Interval History:   Patient presents today 2 weeks status post the above procedure.  He states today that pain has improved some and now rates this as mild to moderate on average with taking Tylenol  every 8 hours.  He has been able to discontinue hydrocodone .  Does experience occasional stinging, sharp pains but consistently experiences more of a dull ache.  There is still some numbness in the anterior and lateral upper arm below the incision.  Denies any incision concerns, fever, or chills.   PMH/PSH/Family History/Social History/Meds/Allergies:    Past Medical History:  Diagnosis Date   Anxiety    Asthma    Basal cell carcinoma, arm    (bilateral forearm)   COPD (chronic obstructive pulmonary disease) (HCC)    Depression    Dyspepsia    Endometriosis    Gender dysphoria    GERD (gastroesophageal reflux disease)    Heart murmur 2018   echocardiogram   Hepatic steatosis    Hepatitis    History of Helicobacter pylori infection 2021   Hyperlipidemia    Hypertension    Hypothyroidism    Neuropathy of left foot    Osteoarthritis    PONV (postoperative nausea and vomiting)    PVC (premature ventricular contraction) 2008   echocardiogram   Stage 3b chronic kidney disease (CKD) (HCC)    Vitamin D deficiency    Past Surgical History:  Procedure Laterality Date   ANTERIOR CERVICAL DECOMP/DISCECTOMY FUSION  2014   BREAST BIOPSY Right 2012   COLONOSCOPY  2000   ESOPHAGOGASTRODUODENOSCOPY (EGD) WITH PROPOFOL  N/A 10/16/2019   Procedure: ESOPHAGOGASTRODUODENOSCOPY (EGD) WITH PROPOFOL ;  Surgeon: Unk Corinn Skiff, MD;  Location: [facility];  Service: Gastroenterology;  Laterality: N/A;   HAMMER TOE SURGERY Left 2015   KNEE ARTHROSCOPY Right 2004   arthritis   LAPAROSCOPY  2000   endometriosis   LYSIS OF ADHESION N/A 07/01/2022    Procedure: LYSIS OF ADHESION;  Surgeon: Verdon Keen, MD;  Location: [facility];  Service: Gynecology;  Laterality: N/A;   NASAL SEPTUM SURGERY  2009   ORIF SHOULDER FRACTURE Right 11/15/2023   Procedure: OPEN REDUCTION INTERNAL FIXATION (ORIF) SHOULDER FRACTURE;  Surgeon: Genelle Standing, MD;  Location: [facility];  Service: Orthopedics;  Laterality: Right;  RIGHT SHOULDER GREATER TUBEROSITY FRACTURE FIXATION   SIMPLE MASTECTOMY Bilateral 2017   with nipple graft   TONSILLECTOMY  2009   TOTAL ABDOMINAL HYSTERECTOMY W/ BILATERAL SALPINGOOPHORECTOMY  2006   TRACHELECTOMY N/A 07/01/2022   Procedure: ROBOTIC ASSISTED TRACHELECTOMY;  Surgeon: Verdon Keen, MD;  Location: [facility] OR[facility];  Service: Gynecology;  Laterality: N/A;   UPPER GI ENDOSCOPY  2003   Social History   Socioeconomic History   Marital status: Single    Spouse name: Not on file   Number of children: 0   Years of education: Not on file   Highest education level: Not on file  Occupational History   Not on file  Tobacco Use   Smoking status: Former    Current packs/day: 0.00    Types: Cigarettes    Quit date: 2006    Years since quitting: 19.8   Smokeless tobacco: Never   Tobacco comments:    quit June 19 2004  Vaping Use  Vaping status: Former   Quit date: 10/26/2023   Substances: CBD   Devices: cbd  Substance and Sexual Activity   Alcohol use: Yes    Comment: rare   Drug use: No   Sexual activity: Not on file  Other Topics Concern   Not on file  Social History Narrative   Lives alone   Social Drivers of Health   Financial Resource Strain: Low Risk  (10/24/2023)   Received from [facility]   Overall Financial Resource Strain (CARDIA)    Difficulty of Paying Living Expenses: Not hard at all  Food Insecurity: No Food Insecurity (10/24/2023)   Received from [facility]   Hunger Vital Sign    Within the past 12 months, you worried that your food would run out before you got the  money to buy more.: Never true    Within the past 12 months, the food you bought just didn't last and you didn't have money to get more.: Never true  Transportation Needs: No Transportation Needs (10/24/2023)   Received from [facility]    In the past 12 months, has lack of transportation kept you from medical appointments or from getting medications?: No    Lack of Transportation (Non-Medical): No  Physical Activity: Not on file  Stress: Not on file  Social Connections: Not on file   No family history on file. Allergies  Allergen Reactions   Diclofenac  Other (See Comments)    Gas , nauseated , headache    Molnupiravir Hives   Nsaids     Due to Kidney disease   Current Outpatient Medications  Medication Sig Dispense Refill   acetaminophen  (TYLENOL ) 650 MG CR tablet Take 1,300 mg by mouth daily.     albuterol (VENTOLIN HFA) 108 (90 Base) MCG/ACT inhaler Inhale 2 puffs into the lungs every 6 (six) hours as needed for wheezing or shortness of breath.     aspirin EC 325 MG tablet Take 1 tablet (325 mg total) by mouth daily. 30 tablet 0   atorvastatin (LIPITOR) 20 MG tablet Take 20 mg by mouth daily.  0   cabotegravir  ER (APRETUDE ) 600 MG/3ML injection Inject 3 mLs (600 mg total) into the muscle every 2 (two) months. 3 mL 5   CANNABIDIOL PO Take by mouth. Vaping     cetirizine (ZYRTEC) 10 MG tablet Take 10 mg by mouth daily.     Cholecalciferol (VITAMIN D-1000 MAX ST) 25 MCG (1000 UT) tablet Take 1,000 Units by mouth.     Cinnamon 500 MG capsule Take 1,000 mg by mouth daily.     Cranberry-Vitamin C-Probiotic (AZO CRANBERRY PO) Take 2 tablets by mouth daily.     docusate sodium  (COLACE) 100 MG capsule Take 1 capsule (100 mg total) by mouth 2 (two) times daily. To keep stools soft 30 capsule 0   empagliflozin (JARDIANCE) 10 MG TABS tablet Take 10 mg by mouth daily.     fenofibrate 54 MG tablet Take 54 mg by mouth daily.     gabapentin   (NEURONTIN ) 300 MG capsule Take 300 mg by mouth daily.     gabapentin  (NEURONTIN ) 300 MG capsule Take 1 capsule (300 mg total) by mouth at bedtime. PRN (Patient not taking: Reported on 10/22/2023) 14 capsule 0   HYDROcodone -acetaminophen  (NORCO/VICODIN) 5-325 MG tablet Take 1-2 tablets by mouth every 4 (four) hours as needed. 25 tablet 0   lansoprazole (PREVACID) 15 MG capsule Take 15 mg  by mouth daily.     levothyroxine (SYNTHROID) 75 MCG tablet Take 75 mcg by mouth daily before breakfast.     mupirocin ointment (BACTROBAN) 2 % Apply topically 2 (two) times daily.     NEEDLE, DISP, 18 G (B-D HYPODERMIC NEEDLE 18GX1.5) 18G X 1-1/2 MISC Use q2 weeks as directed     QUEtiapine (SEROQUEL) 25 MG tablet Take 25 mg by mouth at bedtime.     spironolactone (ALDACTONE) 25 MG tablet Take 25 mg by mouth daily.     SYMBICORT 160-4.5 MCG/ACT inhaler Inhale 1 puff into the lungs.     SYRINGE-NEEDLE, DISP, 3 ML (B-D 3CC LUER-LOK SYR 25GX5/8) 25G X 5/8 3 ML MISC USE TO INJECT TESTOSTERONE  EVERY 2 WEEKS AS DIRECTED     testosterone  cypionate (DEPOTESTOSTERONE CYPIONATE) 200 MG/ML injection Inject 0.5 mLs into the muscle every 14 (fourteen) days.     traZODone (DESYREL) 50 MG tablet Take 50 mg by mouth at bedtime.     Current Facility-Administered Medications  Medication Dose Route Frequency Provider Last Rate Last Admin   triamcinolone  acetonide (KENALOG ) 10 MG/ML injection 10 mg  10 mg Other Once Magdalen Pasco RAMAN, [place]       DG Shoulder Right Result Date: 11/30/2023 EXAM: 1 VIEW XRAY OF THE RIGHT SHOULDER 11/30/2023 03:18:13 PM COMPARISON: 15 days ago status post surgical internal fixation of proximal right humeral fracture no dislocation is noted. CLINICAL HISTORY: post op FINDINGS: BONES AND JOINTS: Status post surgical internal fixation of proximal right humeral fracture. No dislocation is noted. The glenohumeral joint is normally aligned. No acute fracture. The [facility] joint is unremarkable in appearance. SOFT  TISSUES: No abnormal calcifications. Visualized lung is unremarkable. IMPRESSION: 1. Status post surgical internal fixation of proximal right humeral fracture. 2. No dislocation. Electronically signed by: Lynwood Seip MD 11/30/2023 04:32 PM EST RP Workstation: [id number]    Review of Systems:   A ROS was performed including pertinent positives and negatives as documented in the HPI.   Musculoskeletal Exam:    There were no vitals taken for this visit.  Right shoulder incision is well-appearing without evidence of erythema or drainage.  There is some decrease sensitivity over the anterior arm inferior to the incision.  Otherwise distal motor and neurosensory exam is intact.  Imaging:   X-ray right shoulder 2 views Status post greater tuberosity ORIF with no evidence of hardware complication   I personally reviewed and interpreted the radiographs.   Assessment:   Patient is 2 weeks status post right shoulder ORIF for a displaced greater tuberosity fracture.  He has been compliant with sling usage.  He has not yet began with physical therapy.  Has been able to transition to Tylenol  for pain control.  Will plan to have him refrain from any right shoulder active range of motion for another 2 weeks and continue sling usage.  He will begin physical therapy soon for passive range of motion followed by progression of active range of motion at 4 weeks per protocol.  Will clear to return to driving at 4 weeks.  All questions welcomed and addressed at this time.  Plan :    - Return to clinic in 4 weeks for reassessment      I personally saw and evaluated the patient, and participated in the management and treatment plan.  Leonce Reveal, PA-C Orthopedics

## 2023-12-12 ENCOUNTER — Telehealth (HOSPITAL_BASED_OUTPATIENT_CLINIC_OR_DEPARTMENT_OTHER): Payer: Self-pay | Admitting: Orthopaedic Surgery

## 2023-12-12 NOTE — Telephone Encounter (Signed)
 I called and talked to the pt. He stated he is having swelling in his hand and shooting pains down the ar. Also mentions something about a lump at the base of the incision. He stated he is taking Tylenol  arthritis. I advised to bend elbow some to elevate the hand to help with swelling and offered and appt to come back in and discuss but declined and would rather me send a message to [place] to ask him. Please call pt or advise

## 2023-12-12 NOTE — Telephone Encounter (Signed)
 Patient has a question about the surgery please contact [phone]

## 2023-12-13 NOTE — Telephone Encounter (Signed)
 Called and talked pt to come in tomorrow

## 2023-12-14 ENCOUNTER — Ambulatory Visit (HOSPITAL_BASED_OUTPATIENT_CLINIC_OR_DEPARTMENT_OTHER): Admitting: Student

## 2023-12-14 DIAGNOSIS — S4291XA Fracture of right shoulder girdle, part unspecified, initial encounter for closed fracture: Secondary | ICD-10-CM

## 2023-12-14 NOTE — Progress Notes (Signed)
 Post Operative Evaluation    Procedure/Date of Surgery: Right greater tuberosity fracture ORIF 11/15/2023  Interval History:   Patient presents today approximately 4 weeks status post the above procedure.  Does report today that he has been having some more frequent, sharp pains within the upper arm.  Does also feel like there is swelling over the outside of the arm below the incision.  He is set to begin physical therapy on 12/9.  Takes Tylenol  a few times daily for pain.   PMH/PSH/Family History/Social History/Meds/Allergies:    Past Medical History:  Diagnosis Date   Anxiety    Asthma    Basal cell carcinoma, arm    (bilateral forearm)   COPD (chronic obstructive pulmonary disease) (HCC)    Depression    Dyspepsia    Endometriosis    Gender dysphoria    GERD (gastroesophageal reflux disease)    Heart murmur 2018   echocardiogram   Hepatic steatosis    Hepatitis    History of Helicobacter pylori infection 2021   Hyperlipidemia    Hypertension    Hypothyroidism    Neuropathy of left foot    Osteoarthritis    PONV (postoperative nausea and vomiting)    PVC (premature ventricular contraction) 2008   echocardiogram   Stage 3b chronic kidney disease (CKD) (HCC)    Vitamin D deficiency    Past Surgical History:  Procedure Laterality Date   ANTERIOR CERVICAL DECOMP/DISCECTOMY FUSION  2014   BREAST BIOPSY Right 2012   COLONOSCOPY  2000   ESOPHAGOGASTRODUODENOSCOPY (EGD) WITH PROPOFOL  N/A 10/16/2019   Procedure: ESOPHAGOGASTRODUODENOSCOPY (EGD) WITH PROPOFOL ;  Surgeon: Unk Corinn Skiff, MD;  Location: [facility];  Service: Gastroenterology;  Laterality: N/A;   HAMMER TOE SURGERY Left 2015   KNEE ARTHROSCOPY Right 2004   arthritis   LAPAROSCOPY  2000   endometriosis   LYSIS OF ADHESION N/A 07/01/2022   Procedure: LYSIS OF ADHESION;  Surgeon: Verdon Keen, MD;  Location: [facility];  Service: Gynecology;  Laterality: N/A;    NASAL SEPTUM SURGERY  2009   ORIF SHOULDER FRACTURE Right 11/15/2023   Procedure: OPEN REDUCTION INTERNAL FIXATION (ORIF) SHOULDER FRACTURE;  Surgeon: Genelle Standing, MD;  Location: [facility];  Service: Orthopedics;  Laterality: Right;  RIGHT SHOULDER GREATER TUBEROSITY FRACTURE FIXATION   SIMPLE MASTECTOMY Bilateral 2017   with nipple graft   TONSILLECTOMY  2009   TOTAL ABDOMINAL HYSTERECTOMY W/ BILATERAL SALPINGOOPHORECTOMY  2006   TRACHELECTOMY N/A 07/01/2022   Procedure: ROBOTIC ASSISTED TRACHELECTOMY;  Surgeon: Verdon Keen, MD;  Location: [facility];  Service: Gynecology;  Laterality: N/A;   UPPER GI ENDOSCOPY  2003   Social History   Socioeconomic History   Marital status: Single    Spouse name: Not on file   Number of children: 0   Years of education: Not on file   Highest education level: Not on file  Occupational History   Not on file  Tobacco Use   Smoking status: Former    Current packs/day: 0.00    Types: Cigarettes    Quit date: 2006    Years since quitting: 19.9   Smokeless tobacco: Never   Tobacco comments:    quit June 19 2004  Vaping Use   Vaping status: Former   Quit date: 10/26/2023   Substances: CBD   Devices:  cbd  Substance and Sexual Activity   Alcohol use: Yes    Comment: rare   Drug use: No   Sexual activity: Not on file  Other Topics Concern   Not on file  Social History Narrative   Lives alone   Social Drivers of Health   Financial Resource Strain: Low Risk  (10/24/2023)   Received from [facility]   Overall Financial Resource Strain (CARDIA)    Difficulty of Paying Living Expenses: Not hard at all  Food Insecurity: No Food Insecurity (10/24/2023)   Received from [facility]   Hunger Vital Sign    Within the past 12 months, you worried that your food would run out before you got the money to buy more.: Never true    Within the past 12 months, the food you bought just didn't last and you didn't have money  to get more.: Never true  Transportation Needs: No Transportation Needs (10/24/2023)   Received from [facility] - Transportation    In the past 12 months, has lack of transportation kept you from medical appointments or from getting medications?: No    Lack of Transportation (Non-Medical): No  Physical Activity: Not on file  Stress: Not on file  Social Connections: Not on file   No family history on file. Allergies  Allergen Reactions   Diclofenac  Other (See Comments)    Gas , nauseated , headache    Molnupiravir Hives   Nsaids     Due to Kidney disease   Current Outpatient Medications  Medication Sig Dispense Refill   acetaminophen  (TYLENOL ) 650 MG CR tablet Take 1,300 mg by mouth daily.     albuterol (VENTOLIN HFA) 108 (90 Base) MCG/ACT inhaler Inhale 2 puffs into the lungs every 6 (six) hours as needed for wheezing or shortness of breath.     aspirin  EC 325 MG tablet Take 1 tablet (325 mg total) by mouth daily. 30 tablet 0   atorvastatin (LIPITOR) 20 MG tablet Take 20 mg by mouth daily.  0   cabotegravir  ER (APRETUDE ) 600 MG/3ML injection Inject 3 mLs (600 mg total) into the muscle every 2 (two) months. 3 mL 5   CANNABIDIOL PO Take by mouth. Vaping     cetirizine (ZYRTEC) 10 MG tablet Take 10 mg by mouth daily.     Cholecalciferol (VITAMIN D-1000 MAX ST) 25 MCG (1000 UT) tablet Take 1,000 Units by mouth.     Cinnamon 500 MG capsule Take 1,000 mg by mouth daily.     Cranberry-Vitamin C-Probiotic (AZO CRANBERRY PO) Take 2 tablets by mouth daily.     docusate sodium  (COLACE) 100 MG capsule Take 1 capsule (100 mg total) by mouth 2 (two) times daily. To keep stools soft 30 capsule 0   empagliflozin (JARDIANCE) 10 MG TABS tablet Take 10 mg by mouth daily.     fenofibrate 54 MG tablet Take 54 mg by mouth daily.     gabapentin  (NEURONTIN ) 300 MG capsule Take 300 mg by mouth daily.     gabapentin  (NEURONTIN ) 300 MG capsule Take 1 capsule (300 mg total) by  mouth at bedtime. PRN (Patient not taking: Reported on 10/22/2023) 14 capsule 0   HYDROcodone -acetaminophen  (NORCO/VICODIN) 5-325 MG tablet Take 1-2 tablets by mouth every 4 (four) hours as needed. 25 tablet 0   lansoprazole (PREVACID) 15 MG capsule Take 15 mg by mouth daily.     levothyroxine (SYNTHROID) 75 MCG tablet Take 75 mcg  by mouth daily before breakfast.     mupirocin ointment (BACTROBAN) 2 % Apply topically 2 (two) times daily.     NEEDLE, DISP, 18 G (B-D HYPODERMIC NEEDLE 18GX1.5) 18G X 1-1/2 MISC Use q2 weeks as directed     QUEtiapine (SEROQUEL) 25 MG tablet Take 25 mg by mouth at bedtime.     spironolactone (ALDACTONE) 25 MG tablet Take 25 mg by mouth daily.     SYMBICORT 160-4.5 MCG/ACT inhaler Inhale 1 puff into the lungs.     SYRINGE-NEEDLE, DISP, 3 ML (B-D 3CC LUER-LOK SYR 25GX5/8) 25G X 5/8 3 ML MISC USE TO INJECT TESTOSTERONE  EVERY 2 WEEKS AS DIRECTED     testosterone  cypionate (DEPOTESTOSTERONE CYPIONATE) 200 MG/ML injection Inject 0.5 mLs into the muscle every 14 (fourteen) days.     traZODone (DESYREL) 50 MG tablet Take 50 mg by mouth at bedtime.     Current Facility-Administered Medications  Medication Dose Route Frequency Provider Last Rate Last Admin   triamcinolone  acetonide (KENALOG ) 10 MG/ML injection 10 mg  10 mg Other Once Regal, Norman S, DPM       No results found.   Review of Systems:   A ROS was performed including pertinent positives and negatives as documented in the HPI.   Musculoskeletal Exam:    There were no vitals taken for this visit.  Right shoulder incision is well-appearing without evidence of erythema or drainage.  There is some mild swelling in the upper arm just distal to the incision.  Patient has upper extremity immobilized in a sling.  Distal motor and neurosensory exam intact.   Imaging:     Assessment:   Patient presents today 4 weeks status post greater tuberosity ORIF of the right shoulder.  He continues to be compliant  with sling usage and activity restrictions although states that he has had some increased episodes of swelling and sharp pains within the shoulder.  Exam today is reassuring with no signs of infection or incision concerns.  There is some swelling in the mid upper arm which may simply represent postsurgical inflammation versus seroma formation as there is some mild fluctuance.  Will continue to monitor and have patient update us  on any significant changes in symptoms.  He will begin physical therapy just prior to next follow-up in 2 weeks.  Plan :    - Return to clinic as scheduled on 12/10      I personally saw and evaluated the patient, and participated in the management and treatment plan.  Leonce Reveal, PA-C Orthopedics
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
## 2023-12-27 ENCOUNTER — Ambulatory Visit

## 2023-12-27 DIAGNOSIS — M79621 Pain in right upper arm: Secondary | ICD-10-CM | POA: Insufficient documentation

## 2023-12-27 DIAGNOSIS — M25611 Stiffness of right shoulder, not elsewhere classified: Secondary | ICD-10-CM | POA: Diagnosis present

## 2023-12-27 DIAGNOSIS — M6281 Muscle weakness (generalized): Secondary | ICD-10-CM | POA: Diagnosis present

## 2023-12-27 DIAGNOSIS — S4291XA Fracture of right shoulder girdle, part unspecified, initial encounter for closed fracture: Secondary | ICD-10-CM | POA: Diagnosis not present

## 2023-12-28 ENCOUNTER — Ambulatory Visit (INDEPENDENT_AMBULATORY_CARE_PROVIDER_SITE_OTHER): Admitting: Orthopaedic Surgery

## 2023-12-28 ENCOUNTER — Ambulatory Visit (INDEPENDENT_AMBULATORY_CARE_PROVIDER_SITE_OTHER)

## 2023-12-28 DIAGNOSIS — S4291XA Fracture of right shoulder girdle, part unspecified, initial encounter for closed fracture: Secondary | ICD-10-CM

## 2023-12-28 NOTE — Progress Notes (Signed)
 Post Operative Evaluation      Procedure/Date of Surgery: Right greater tuberosity fracture ORIF 11/15/2023   Interval History:    Patient presents today approximately 6 weeks status post the above procedure.  Overall he is doing very well     PMH/PSH/Family History/Social History/Meds/Allergies:         Past Medical History:  Diagnosis Date   Anxiety     Asthma     Basal cell carcinoma, arm      (bilateral forearm)   COPD (chronic obstructive pulmonary disease) (HCC)     Depression     Dyspepsia     Endometriosis     Gender dysphoria     GERD (gastroesophageal reflux disease)     Heart murmur 2018    echocardiogram   Hepatic steatosis     Hepatitis     History of Helicobacter pylori infection 2021   Hyperlipidemia     Hypertension     Hypothyroidism     Neuropathy of left foot     Osteoarthritis     PONV (postoperative nausea and vomiting)     PVC (premature ventricular contraction) 2008    echocardiogram   Stage 3b chronic kidney disease (CKD) (HCC)     Vitamin D deficiency               Past Surgical History:  Procedure Laterality Date   ANTERIOR CERVICAL DECOMP/DISCECTOMY FUSION   2014   BREAST BIOPSY Right 2012   COLONOSCOPY   2000   ESOPHAGOGASTRODUODENOSCOPY (EGD) WITH PROPOFOL  N/A 10/16/2019    Procedure: ESOPHAGOGASTRODUODENOSCOPY (EGD) WITH PROPOFOL ;  Surgeon: Unk Corinn Skiff, MD;  Location: [facility];  Service: Gastroenterology;  Laterality: N/A;   HAMMER TOE SURGERY Left 2015   KNEE ARTHROSCOPY Right 2004    arthritis   LAPAROSCOPY   2000    endometriosis   LYSIS OF ADHESION N/A 07/01/2022    Procedure: LYSIS OF ADHESION;  Surgeon: Verdon Keen, MD;  Location: [facility];  Service: Gynecology;  Laterality: N/A;   NASAL SEPTUM SURGERY   2009   ORIF SHOULDER FRACTURE Right 11/15/2023    Procedure: OPEN REDUCTION INTERNAL FIXATION (ORIF) SHOULDER FRACTURE;  Surgeon: Genelle Standing, MD;  Location: [facility];  Service:  Orthopedics;  Laterality: Right;  RIGHT SHOULDER GREATER TUBEROSITY FRACTURE FIXATION   SIMPLE MASTECTOMY Bilateral 2017    with nipple graft   TONSILLECTOMY   2009   TOTAL ABDOMINAL HYSTERECTOMY W/ BILATERAL SALPINGOOPHORECTOMY   2006   TRACHELECTOMY N/A 07/01/2022    Procedure: ROBOTIC ASSISTED TRACHELECTOMY;  Surgeon: Verdon Keen, MD;  Location: [facility];  Service: Gynecology;  Laterality: N/A;   UPPER GI ENDOSCOPY   2003        Social History         Socioeconomic History   Marital status: Single      Spouse name: Not on file   Number of children: 0   Years of education: Not on file   Highest education level: Not on file  Occupational History   Not on file  Tobacco Use   Smoking status: Former      Current packs/day: 0.00      Types: Cigarettes      Quit date: 2006      Years since quitting: 19.9   Smokeless tobacco: Never   Tobacco comments:      quit June 19 2004  Vaping Use   Vaping status: Former   Quit date: 10/26/2023  Substances: CBD   Devices: cbd  Substance and Sexual Activity   Alcohol use: Yes      Comment: rare   Drug use: No   Sexual activity: Not on file  Other Topics Concern   Not on file  Social History Narrative    Lives alone    Social Drivers of Health        Financial Resource Strain: Low Risk  (10/24/2023)    Received from [facility]    Overall Financial Resource Strain (CARDIA)     Difficulty of Paying Living Expenses: Not hard at all  Food Insecurity: No Food Insecurity (10/24/2023)    Received from [facility]    Hunger Vital Sign     Within the past 12 months, you worried that your food would run out before you got the money to buy more.: Never true     Within the past 12 months, the food you bought just didn't last and you didn't have money to get more.: Never true  Transportation Needs: No Transportation Needs (10/24/2023)    Received from [facility]  Transportation     In the past 12 months, has lack of transportation kept you from medical appointments or from getting medications?: No     Lack of Transportation (Non-Medical): No  Physical Activity: Not on file  Stress: Not on file  Social Connections: Not on file    No family history on file.     Allergies       Allergies  Allergen Reactions   Diclofenac  Other (See Comments)      Gas , nauseated , headache    Molnupiravir Hives   Nsaids        Due to Kidney disease            Current Outpatient Medications  Medication Sig Dispense Refill   acetaminophen  (TYLENOL ) 650 MG CR tablet Take 1,300 mg by mouth daily.       albuterol (VENTOLIN HFA) 108 (90 Base) MCG/ACT inhaler Inhale 2 puffs into the lungs every 6 (six) hours as needed for wheezing or shortness of breath.       aspirin  EC 325 MG tablet Take 1 tablet (325 mg total) by mouth daily. 30 tablet 0   atorvastatin (LIPITOR) 20 MG tablet Take 20 mg by mouth daily.   0   cabotegravir  ER (APRETUDE ) 600 MG/3ML injection Inject 3 mLs (600 mg total) into the muscle every 2 (two) months. 3 mL 5   CANNABIDIOL PO Take by mouth. Vaping       cetirizine (ZYRTEC) 10 MG tablet Take 10 mg by mouth daily.       Cholecalciferol (VITAMIN D-1000 MAX ST) 25 MCG (1000 UT) tablet Take 1,000 Units by mouth.       Cinnamon 500 MG capsule Take 1,000 mg by mouth daily.       Cranberry-Vitamin C-Probiotic (AZO CRANBERRY PO) Take 2 tablets by mouth daily.       docusate sodium  (COLACE) 100 MG capsule Take 1 capsule (100 mg total) by mouth 2 (two) times daily. To keep stools soft 30 capsule 0   empagliflozin (JARDIANCE) 10 MG TABS tablet Take 10 mg by mouth daily.       fenofibrate 54 MG tablet Take 54 mg by mouth daily.       gabapentin  (NEURONTIN ) 300 MG capsule Take 300 mg by mouth daily.  gabapentin  (NEURONTIN ) 300 MG capsule Take 1 capsule (300 mg total) by mouth at bedtime. PRN (Patient not taking: Reported on 10/22/2023) 14 capsule 0    HYDROcodone -acetaminophen  (NORCO/VICODIN) 5-325 MG tablet Take 1-2 tablets by mouth every 4 (four) hours as needed. 25 tablet 0   lansoprazole (PREVACID) 15 MG capsule Take 15 mg by mouth daily.       levothyroxine (SYNTHROID) 75 MCG tablet Take 75 mcg by mouth daily before breakfast.       mupirocin ointment (BACTROBAN) 2 % Apply topically 2 (two) times daily.       NEEDLE, DISP, 18 G (B-D HYPODERMIC NEEDLE 18GX1.5) 18G X 1-1/2 MISC Use q2 weeks as directed       QUEtiapine (SEROQUEL) 25 MG tablet Take 25 mg by mouth at bedtime.       spironolactone (ALDACTONE) 25 MG tablet Take 25 mg by mouth daily.       SYMBICORT 160-4.5 MCG/ACT inhaler Inhale 1 puff into the lungs.       SYRINGE-NEEDLE, DISP, 3 ML (B-D 3CC LUER-LOK SYR 25GX5/8) 25G X 5/8 3 ML MISC USE TO INJECT TESTOSTERONE  EVERY 2 WEEKS AS DIRECTED       testosterone  cypionate (DEPOTESTOSTERONE CYPIONATE) 200 MG/ML injection Inject 0.5 mLs into the muscle every 14 (fourteen) days.       traZODone (DESYREL) 50 MG tablet Take 50 mg by mouth at bedtime.                   Current Facility-Administered Medications  Medication Dose Route Frequency Provider Last Rate Last Admin   triamcinolone  acetonide (KENALOG ) 10 MG/ML injection 10 mg  10 mg Other Once Magdalen Pasco RAMAN, DPM          Imaging Results (Last 48 hours)  No results found.       Review of Systems:   A ROS was performed including pertinent positives and negatives as documented in the HPI.     Musculoskeletal Exam:     There were no vitals taken for this visit.   Right shoulder incision is well-appearing without evidence of erythema or drainage.  There is some mild swelling in the upper arm just distal to the incision.  Patient has upper extremity immobilized in a sling.  Distal motor and neurosensory exam intact.     Imaging:       Assessment:   Patient presents today 6 weeks status post right greater tuberosity open reduction internal fixation.  At this time he  will be allowed to do active range of motion as tolerated.  I would like have no lifting greater than 15 pounds.  I will plan to see him in 6 weeks for reassessment   Plan :     - Return to clinic 6 weeks for reassessment           I personally saw and evaluated the patient, and participated in the management and treatment plan.

## 2023-12-29 ENCOUNTER — Ambulatory Visit

## 2024-01-02 ENCOUNTER — Ambulatory Visit

## 2024-01-02 DIAGNOSIS — M79621 Pain in right upper arm: Secondary | ICD-10-CM

## 2024-01-02 DIAGNOSIS — M6281 Muscle weakness (generalized): Secondary | ICD-10-CM

## 2024-01-02 DIAGNOSIS — M25611 Stiffness of right shoulder, not elsewhere classified: Secondary | ICD-10-CM

## 2024-01-02 NOTE — Therapy (Signed)
 OUTPATIENT PHYSICAL THERAPY SHOULDER/ELBOW EVALUATION/TREATMENT  Patient Name: Destiny Wright MRN: [id number] DOB:09-05-1965, 58 y.o., adult Today's Date: 01/02/2024  END OF SESSION:    Past Medical History:  Diagnosis Date   Anxiety    Asthma    Basal cell carcinoma, arm    (bilateral forearm)   COPD (chronic obstructive pulmonary disease) (HCC)    Depression    Dyspepsia    Endometriosis    Gender dysphoria    GERD (gastroesophageal reflux disease)    Heart murmur 2018   echocardiogram   Hepatic steatosis    Hepatitis    History of Helicobacter pylori infection 2021   Hyperlipidemia    Hypertension    Hypothyroidism    Neuropathy of left foot    Osteoarthritis    PONV (postoperative nausea and vomiting)    PVC (premature ventricular contraction) 2008   echocardiogram   Stage 3b chronic kidney disease (CKD) (HCC)    Vitamin D deficiency    Past Surgical History:  Procedure Laterality Date   ANTERIOR CERVICAL DECOMP/DISCECTOMY FUSION  2014   BREAST BIOPSY Right 2012   COLONOSCOPY  2000   ESOPHAGOGASTRODUODENOSCOPY (EGD) WITH PROPOFOL  N/A 10/16/2019   Procedure: ESOPHAGOGASTRODUODENOSCOPY (EGD) WITH PROPOFOL ;  Surgeon: Unk Destiny Skiff, MD;  Location: [facility];  Service: Gastroenterology;  Laterality: N/A;   HAMMER TOE SURGERY Left 2015   KNEE ARTHROSCOPY Right 2004   arthritis   LAPAROSCOPY  2000   endometriosis   LYSIS OF ADHESION N/A 07/01/2022   Procedure: LYSIS OF ADHESION;  Surgeon: Destiny Keen, MD;  Location: [facility];  Service: Gynecology;  Laterality: N/A;   NASAL SEPTUM SURGERY  2009   ORIF SHOULDER FRACTURE Right 11/15/2023   Procedure: OPEN REDUCTION INTERNAL FIXATION (ORIF) SHOULDER FRACTURE;  Surgeon: Destiny Standing, MD;  Location: [facility];  Service: Orthopedics;  Laterality: Right;  RIGHT SHOULDER GREATER TUBEROSITY FRACTURE FIXATION   SIMPLE MASTECTOMY Bilateral 2017   with nipple graft   TONSILLECTOMY  2009   TOTAL ABDOMINAL  HYSTERECTOMY W/ BILATERAL SALPINGOOPHORECTOMY  2006   TRACHELECTOMY N/A 07/01/2022   Procedure: ROBOTIC ASSISTED TRACHELECTOMY;  Surgeon: Destiny Keen, MD;  Location: [facility];  Service: Gynecology;  Laterality: N/A;   UPPER GI ENDOSCOPY  2003   Patient Active Problem List   Diagnosis Date Noted   Closed displaced fracture of greater tuberosity of right humerus 11/15/2023   Anterior dislocation of right shoulder 10/22/2023   Dyspepsia    Murmur, cardiac 05/14/2016   Premature ventricular contractions 05/14/2016   Endometriosis 08/31/2013    PCP: Destiny Alm SQUIBB, MD  REFERRING PROVIDER:  Genelle Standing, MD     REFERRING DIAG:  S42.91XA (ICD-10-CM) - Closed fracture of right shoulder, initial encounter    RATIONALE FOR EVALUATION AND TREATMENT: Rehabilitation  THERAPY DIAG: No diagnosis found.  ONSET DATE: s/p Right greater tuberosity fracture ORIF 11/15/2023   FOLLOW-UP APPT SCHEDULED WITH REFERRING PROVIDER: Yes    SUBJECTIVE:  SUBJECTIVE STATEMENT:    Patient reports to OPPT with R shoulder pain   PERTINENT HISTORY:   Destiny Wright is 58 y.o female presenting with R shoulder following Right greater tuberosity fracture ORIF 11/15/2023. Patient reports that there is increased swellign in the upper R arm and a sharp, stinging pain. He was told by Dr. Leonce reported increased fluid in the upper arm. There is still some numbness in the anterior and lateral upper arm below the incision. Patient has upper extremity immobilized in a sling since Oct 4th. He has been working on grip strength and active elbow extension/flexion.   Hand Dominance: Right   Denies bilateral numbness/tingling, chills, fevers, nausea.   Imaging (Per Chart Review):  EXAM: 1 VIEW XRAY OF THE RIGHT  SHOULDER 11/30/2023 03:18:13 PM   COMPARISON: 15 days ago status post surgical internal fixation of proximal right humeral fracture no dislocation is noted.   CLINICAL HISTORY: post op   FINDINGS:   BONES AND JOINTS: Status post surgical internal fixation of proximal right humeral fracture. No dislocation is noted. The glenohumeral joint is normally aligned. No acute fracture. The [facility] joint is unremarkable in appearance.   SOFT TISSUES: No abnormal calcifications. Visualized lung is unremarkable.   IMPRESSION: 1. Status post surgical internal fixation of proximal right humeral fracture. 2. No dislocation.   Electronically signed by: Destiny Seip MD 11/30/2023 04:32 PM EST RP Workstation: [id number]  PAIN:  Pain Intensity: Present: 8/10, Best: 8/10, Worst: 10/10 Pain location: R Shoulder Pain Quality: intermittent  Radiating: No  Numbness/Tingling: Yes Aggravating factors: Random Onset Relieving factors: Desensitizing, rubbing the shoulder   PRECAUTIONS: Shoulder PROM, AROM when cleared   Per [place] UE ORIF Protocol: (Discontinued - 01/02/2024)  Weeks 4-8:  Begin FULL ROM   PROMAAROMAROM   Sling as needed for comfort only   May begin isometric strengthening   Week 6 - Light resistance strengthening progression may begin    WEIGHT BEARING RESTRICTIONS: Yes    FALLS: Has patient fallen in last 6 months? Yes. Number of falls 1  Living Environment Lives with: lives alone Lives in: House/apartment Stairs: Yes: Internal: 10-13 steps; on right going up Has following equipment at home: None  Prior level of function: Independent  Occupational demands:Total Disability   Hobbies: Biking (E-bike)   Patient Goals: I would like to improve ROM in the my R arm   OBJECTIVE:   Patient Surveys  QuickDASH:  Cognition Patient is oriented to person, place, and time.  Recent memory is intact.  Remote memory is intact.  Attention span and concentration are  intact.  Expressive speech is intact.  Patient's fund of knowledge is within normal limits for educational level.    Gross Musculoskeletal Assessment Tremor: None Bulk: Normal Tone: Normal  Gait WNL  Posture Seated: R arm internally rotated and with elbow contracture out of sling   Cervical Screen AROM: WFL and painless with overpressure in all planes  AROM AROM (Normal range in degrees) AROM/PROM   Cervical  Flexion (50)   Extension (80)   Right lateral flexion (45)   Left lateral flexion (45)   Right rotation (85)   Left rotation (85)    Right Left  Shoulder    Flexion NT/80 deg  140  Extension    Abduction NT/40 deg 160  External Rotation    Internal Rotation  WNL  Hands Behind Head  WNL  Hands Behind Back        Elbow  WNL  Flexion  WNL  Extension    Pronation    Supination    (* = pain; Blank rows = not tested)  UE MMT: MMT (out of 5) Right Left   Cervical (isometric)  Flexion WNL  Extension WNL  Lateral Flexion WNL WNL  Rotation WNL WNL      Shoulder   Flexion NT 4  Extension    Abduction NT 4  External rotation NT 4  Internal rotation NT 4  Horizontal abduction    Horizontal adduction    Lower Trapezius    Rhomboids    `    Elbow  Flexion NT 5  Extension NT 5  Pronation  5  Supination  5      (* = pain; Blank rows = not tested)  Sensation Decreased Sensation along lateral aspect of upper Arm.   Reflexes Deferred   Palpation Location LEFT  RIGHT           Subocciptials    Cervical paraspinals    Upper Trapezius  1  Levator Scapulae  1  Rhomboid Major/Minor    Sternoclavicular joint    Acromioclavicular joint    Coracoid process    Long head of biceps    Supraspinatus    Infraspinatus    Subscapularis    Teres Minor    Teres Major    Pectoralis Major    Pectoralis Minor    Anterior Deltoid  1  Lateral Deltoid  1  Posterior Deltoid  1  Latissimus Dorsi    Sternocleidomastoid    (Blank rows = not tested) Graded  on 0-4 scale (0 = no pain, 1 = pain, 2 = pain with wincing/grimacing/flinching, 3 = pain with withdrawal, 4 = unwilling to allow palpation), (Blank rows = not tested)   Passive Accessory Intervertebral Motion Pt denies reproduction of shoulder pain with CPA C2-T7 and UPA bilaterally C2-T7. Generally, hypomobile throughout  Accessory Motions/Glides Deferred  Muscle Length Testing Deferred  SPECIAL TESTS Deferred  TODAY'S TREATMENT  DATE: 01/02/2024  Per Dr. Genelle, MD (12/28/2023): At this time he will be allowed to do active range of motion as tolerated. I would like have no lifting greater than 15 pounds. I will plan to see him in 6 weeks for reassessment  Subjective:. Patients reports to OPPT without arm sling. Currently rates R Shoulder pain as a 8/10 NPS; described as shooting, numbness and tingling in the R lateral shoudler . No further questions or concerns.   Therapeutic Activity: UBE - 5 min - 2.5 min Fwd, 2.5 min Retro - Level 3-1 for UE strength and AAROM for reaching and carrying capacity. PT manually adjusted resistance throughout to patient's tolerance.   AAROM - Shoulder Flexion    R 2 x 10 - against treadmill rail  R 2 x 10 - With Pillow Case on stair  R 2 x 10 - with pulley  R 2 x 10 - hookyling with cane   AAROM  - Shoulder abd  R 1 x 10 - with pillow case on stair rail   R 2 x 10 - with pulley   AAROM - Shoulder ER  1 x 10 - hooklying with Cane   PROM - Shoulder ER   2 x 10 - able to achieve 0-5 deg of shoulder ER    PATIENT EDUCATION:  Education details: Exercise Technique a Person educated: Patient Education method: Medical Illustrator Education comprehension: verbalized understanding and returned demonstration   HOME EXERCISE PROGRAM:  Access Code: [email or web address]  URL: [email or web address] Date: 01/02/2024 Prepared by: Lonni Pall  Exercises - Seated Biceps Curl  - 1 x daily - 7 x weekly - 3 sets - 10-20 reps -  Seated Scapular Retraction  - 1 x daily - 7 x weekly - 3 sets - 10-20 reps - Seated Gripping Towel  - 1 x daily - 7 x weekly - 3 sets - 10-12 reps - Circular Shoulder Pendulum with Table Support  - 1 x daily - 7 x weekly - 3 sets - 20 reps - Supine Shoulder Flexion with Dowel  - 2 x daily - 7 x weekly - 3 sets - 10-12 reps - Supine Shoulder External Rotation with Dowel  - 2 x daily - 7 x weekly - 3 sets - 10-12 reps - Supine Shoulder Horizontal Abduction Adduction AAROM with Dowel  - 2 x daily - 7 x weekly - 3 sets - 10-12 reps - Seated Shoulder Flexion Towel Slide at Table Top  - 2 x daily - 7 x weekly - 3 sets - 10-12 reps - Seated Shoulder Abduction Towel Slide at Table Top  - 7 x weekly - 3 sets - 10- reps  ASSESSMENT:  CLINICAL IMPRESSION: Jodie returns to OPPT for management of s/p ORIF on 11/15/2023. Patient is 7 weeks s/p surgery and presented to the clinic without arm sling. Patient presenting with improved swelling in distal UE and improved bicep length. Patient with intermittent sling positioning of R arm however able to relax and also full elbow extension when verbally cued. Extensive time spent on PT education and return demonstration of AAROM for shoulder flexion, abduction and external rotation. Pt able to achieve 90 deg in R shoulder flexion/abduction with assist from pulley and rail slide. Patient still presenting with severe limitations in shoulder ER.Patient's current deficits are limiting him from full participation in iADLs and recreational activities. Updated HEP to include AAROM exercise. PT plan to progress AAROM to AROM to prevent scar adhesion or capsular adhesions. Based on today's performance, pt will continue to benefit from skilled PT in order to facilitate return to PLOF and improve QoL.   OBJECTIVE IMPAIRMENTS: decreased activity tolerance, decreased endurance, decreased ROM, decreased strength, increased edema, increased fascial restrictions, increased muscle spasms,  impaired sensation, and pain.   ACTIVITY LIMITATIONS: carrying, lifting, transfers, bathing, dressing, reach over head, and hygiene/grooming  PARTICIPATION LIMITATIONS: meal prep, cleaning, laundry, community activity, and occupation  PERSONAL FACTORS: Age, Past/current experiences, Time since onset of injury/illness/exacerbation, and 3+ comorbidities: Hx of COPD, ACDF, recent humeral fixation are also affecting patient's functional outcome.   REHAB POTENTIAL: Good  CLINICAL DECISION MAKING: Evolving/moderate complexity  EVALUATION COMPLEXITY: Moderate   GOALS: Goals reviewed with patient? Yes  SHORT TERM GOALS: Target date: 02/07/2024  Pt will be independent with HEP to improve strength and decrease shoulder pain to improve pain-free function at home and work. Baseline: Initial HEP provided  Goal status: INITIAL   LONG TERM GOALS: Target date: 0303/2026  Pt will decrease quick DASH score by at least 8% in order to demonstrate clinically significant reduction in disability with UE ADLs.  Baseline: 12/27/2023: TBD Goal status: INITIAL  2.  Pt will decrease worst shoulder pain by at least 3 points on the NPRS in order to demonstrate clinically significant reduction in shoulder pain. Baseline: 12/27/2023: 10/10 NPS Goal status: INITIAL  3.  Pt will increase R shoulder flexion/abduction AROM to 140/180 respectively and pain free in order to demonstrate limb symmetry for OH lifting and ADLs.  Baseline: 12/27/2023:   Right Left  Shoulder    Flexion NT/80 deg  140  Extension    Abduction NT/40 deg 160   Goal status: INITIAL  4. Pt will increase R shoulder strength by at least 1/2 MMT grade in order to demonstrate improvement in strength and function         Baseline: 12/27/2023:  Shoulder   Flexion NT 4  Extension    Abduction NT 4  External rotation NT 4  Internal rotation NT 4   Goal status: INITIAL  PLAN:  PT FREQUENCY: 1-2x/week  PT DURATION: 12  weeks  PLANNED INTERVENTIONS: [id number]- PT Re-evaluation, 97750- Physical Performance Testing, 97110-Therapeutic exercises, 97530- Therapeutic activity, 97112- Neuromuscular re-education, 97535- Self Care, [id number]- Manual therapy, Patient/Family education, Joint mobilization, Cryotherapy, and Moist heat  PLAN FOR NEXT SESSION: Review HEP, Initiate PROM/AAROM for R shoulder, Education on sling wear schedule per MD Protocol, Elbow and Wrist Mobility   Lonni Pall PT, DPT Physical Therapist- [place]  01/02/2024, 9:35 AM

## 2024-01-03 ENCOUNTER — Ambulatory Visit

## 2024-01-04 ENCOUNTER — Ambulatory Visit

## 2024-01-04 DIAGNOSIS — M6281 Muscle weakness (generalized): Secondary | ICD-10-CM

## 2024-01-04 DIAGNOSIS — M79621 Pain in right upper arm: Secondary | ICD-10-CM | POA: Diagnosis not present

## 2024-01-04 DIAGNOSIS — M25611 Stiffness of right shoulder, not elsewhere classified: Secondary | ICD-10-CM

## 2024-01-04 NOTE — Therapy (Signed)
 OUTPATIENT PHYSICAL THERAPY SHOULDER/ELBOW TREATMENT  Patient Name: Destiny Wright MRN: [id number] DOB:04-24-1965, 58 y.o., adult Today's Date: 01/04/2024  END OF SESSION:  PT End of Session - 01/04/24 1816     Visit Number 3    Number of Visits 24    Date for Recertification  03/20/24    Authorization Type Auth 16 PT visits  12/9 -02/21/24  [id number]    Authorization - Visit Number 3    Authorization - Number of Visits 16    PT Start Time 1817    PT Stop Time 1915    PT Time Calculation (min) 58 min    Activity Tolerance Patient tolerated treatment well    Behavior During Therapy WFL for tasks assessed/performed           Past Medical History:  Diagnosis Date   Anxiety    Asthma    Basal cell carcinoma, arm    (bilateral forearm)   COPD (chronic obstructive pulmonary disease) (HCC)    Depression    Dyspepsia    Endometriosis    Gender dysphoria    GERD (gastroesophageal reflux disease)    Heart murmur 2018   echocardiogram   Hepatic steatosis    Hepatitis    History of Helicobacter pylori infection 2021   Hyperlipidemia    Hypertension    Hypothyroidism    Neuropathy of left foot    Osteoarthritis    PONV (postoperative nausea and vomiting)    PVC (premature ventricular contraction) 2008   echocardiogram   Stage 3b chronic kidney disease (CKD) (HCC)    Vitamin D deficiency    Past Surgical History:  Procedure Laterality Date   ANTERIOR CERVICAL DECOMP/DISCECTOMY FUSION  2014   BREAST BIOPSY Right 2012   COLONOSCOPY  2000   ESOPHAGOGASTRODUODENOSCOPY (EGD) WITH PROPOFOL  N/A 10/16/2019   Procedure: ESOPHAGOGASTRODUODENOSCOPY (EGD) WITH PROPOFOL ;  Surgeon: Unk Corinn Skiff, MD;  Location: [facility];  Service: Gastroenterology;  Laterality: N/A;   HAMMER TOE SURGERY Left 2015   KNEE ARTHROSCOPY Right 2004   arthritis   LAPAROSCOPY  2000   endometriosis   LYSIS OF ADHESION N/A 07/01/2022   Procedure: LYSIS OF ADHESION;  Surgeon: Verdon Keen, MD;  Location: [facility];  Service: Gynecology;  Laterality: N/A;   NASAL SEPTUM SURGERY  2009   ORIF SHOULDER FRACTURE Right 11/15/2023   Procedure: OPEN REDUCTION INTERNAL FIXATION (ORIF) SHOULDER FRACTURE;  Surgeon: Genelle Standing, MD;  Location: [facility];  Service: Orthopedics;  Laterality: Right;  RIGHT SHOULDER GREATER TUBEROSITY FRACTURE FIXATION   SIMPLE MASTECTOMY Bilateral 2017   with nipple graft   TONSILLECTOMY  2009   TOTAL ABDOMINAL HYSTERECTOMY W/ BILATERAL SALPINGOOPHORECTOMY  2006   TRACHELECTOMY N/A 07/01/2022   Procedure: ROBOTIC ASSISTED TRACHELECTOMY;  Surgeon: Verdon Keen, MD;  Location: [facility];  Service: Gynecology;  Laterality: N/A;   UPPER GI ENDOSCOPY  2003   Patient Active Problem List   Diagnosis Date Noted   Closed displaced fracture of greater tuberosity of right humerus 11/15/2023   Anterior dislocation of right shoulder 10/22/2023   Dyspepsia    Murmur, cardiac 05/14/2016   Premature ventricular contractions 05/14/2016   Endometriosis 08/31/2013    PCP: Epifanio Alm SQUIBB, MD  REFERRING PROVIDER:  Genelle Standing, MD     REFERRING DIAG:  S42.91XA (ICD-10-CM) - Closed fracture of right shoulder, initial encounter    RATIONALE FOR EVALUATION AND TREATMENT: Rehabilitation  THERAPY DIAG: Pain in right upper arm  Stiffness of right shoulder, not elsewhere  classified  Muscle weakness (generalized)  ONSET DATE: s/p Right greater tuberosity fracture ORIF 11/15/2023   FOLLOW-UP APPT SCHEDULED WITH REFERRING PROVIDER: Yes    SUBJECTIVE:                                                                                                                                                                                         SUBJECTIVE STATEMENT:    Patient reports to OPPT with R shoulder pain   PERTINENT HISTORY:   Destiny Wright is 58 y.o female presenting with R shoulder following Right greater tuberosity fracture ORIF 11/15/2023.  Patient reports that there is increased swellign in the upper R arm and a sharp, stinging pain. He was told by Dr. Leonce reported increased fluid in the upper arm. There is still some numbness in the anterior and lateral upper arm below the incision. Patient has upper extremity immobilized in a sling since Oct 4th. He has been working on grip strength and active elbow extension/flexion.   Hand Dominance: Right   Denies bilateral numbness/tingling, chills, fevers, nausea.   Imaging (Per Chart Review):  EXAM: 1 VIEW XRAY OF THE RIGHT SHOULDER 11/30/2023 03:18:13 PM   COMPARISON: 15 days ago status post surgical internal fixation of proximal right humeral fracture no dislocation is noted.   CLINICAL HISTORY: post op   FINDINGS:   BONES AND JOINTS: Status post surgical internal fixation of proximal right humeral fracture. No dislocation is noted. The glenohumeral joint is normally aligned. No acute fracture. The [facility] joint is unremarkable in appearance.   SOFT TISSUES: No abnormal calcifications. Visualized lung is unremarkable.   IMPRESSION: 1. Status post surgical internal fixation of proximal right humeral fracture. 2. No dislocation.   Electronically signed by: Lynwood Seip MD 11/30/2023 04:32 PM EST RP Workstation: [id number]  PAIN:  Pain Intensity: Present: 8/10, Best: 8/10, Worst: 10/10 Pain location: R Shoulder Pain Quality: intermittent  Radiating: No  Numbness/Tingling: Yes Aggravating factors: Random Onset Relieving factors: Desensitizing, rubbing the shoulder   PRECAUTIONS: Shoulder PROM, AROM when cleared   Per [place] UE ORIF Protocol: (Discontinued - 01/02/2024)  Weeks 4-8:  Begin FULL ROM   PROMAAROMAROM   Sling as needed for comfort only   May begin isometric strengthening   Week 6 - Light resistance strengthening progression may begin    WEIGHT BEARING RESTRICTIONS: Yes    FALLS: Has patient fallen in last 6 months? Yes. Number of falls  1  Living Environment Lives with: lives alone Lives in: House/apartment Stairs: Yes: Internal: 10-13 steps; on right going up Has following equipment at home: None  Prior level of function: Independent  Occupational demands:Total  Disability   Hobbies: Biking (E-bike)   Patient Goals: I would like to improve ROM in the my R arm   OBJECTIVE:   Patient Surveys  QuickDASH:  Cognition Patient is oriented to person, place, and time.  Recent memory is intact.  Remote memory is intact.  Attention span and concentration are intact.  Expressive speech is intact.  Patient's fund of knowledge is within normal limits for educational level.    Gross Musculoskeletal Assessment Tremor: None Bulk: Normal Tone: Normal  Gait WNL  Posture Seated: R arm internally rotated and with elbow contracture out of sling   Cervical Screen AROM: WFL and painless with overpressure in all planes  AROM AROM (Normal range in degrees) AROM/PROM   Cervical  Flexion (50)   Extension (80)   Right lateral flexion (45)   Left lateral flexion (45)   Right rotation (85)   Left rotation (85)    Right Left  Shoulder    Flexion NT/80 deg  140  Extension    Abduction NT/40 deg 160  External Rotation    Internal Rotation  WNL  Hands Behind Head  WNL  Hands Behind Back        Elbow  WNL  Flexion  WNL  Extension    Pronation    Supination    (* = pain; Blank rows = not tested)  UE MMT: MMT (out of 5) Right Left   Cervical (isometric)  Flexion WNL  Extension WNL  Lateral Flexion WNL WNL  Rotation WNL WNL      Shoulder   Flexion NT 4  Extension    Abduction NT 4  External rotation NT 4  Internal rotation NT 4  Horizontal abduction    Horizontal adduction    Lower Trapezius    Rhomboids    `    Elbow  Flexion NT 5  Extension NT 5  Pronation  5  Supination  5      (* = pain; Blank rows = not tested)  Sensation Decreased Sensation along lateral aspect of upper Arm.    Reflexes Deferred   Palpation Location LEFT  RIGHT           Subocciptials    Cervical paraspinals    Upper Trapezius  1  Levator Scapulae  1  Rhomboid Major/Minor    Sternoclavicular joint    Acromioclavicular joint    Coracoid process    Long head of biceps    Supraspinatus    Infraspinatus    Subscapularis    Teres Minor    Teres Major    Pectoralis Major    Pectoralis Minor    Anterior Deltoid  1  Lateral Deltoid  1  Posterior Deltoid  1  Latissimus Dorsi    Sternocleidomastoid    (Blank rows = not tested) Graded on 0-4 scale (0 = no pain, 1 = pain, 2 = pain with wincing/grimacing/flinching, 3 = pain with withdrawal, 4 = unwilling to allow palpation), (Blank rows = not tested)   Passive Accessory Intervertebral Motion Pt denies reproduction of shoulder pain with CPA C2-T7 and UPA bilaterally C2-T7. Generally, hypomobile throughout  Accessory Motions/Glides Deferred  Muscle Length Testing Deferred  SPECIAL TESTS Deferred  TODAY'S TREATMENT  DATE: 01/04/2024  Per Dr. Genelle, MD (12/28/2023): At this time he will be allowed to do active range of motion as tolerated. I would like have no lifting greater than 15 pounds. I will plan to see him in 6 weeks for reassessment  Subjective:. Patient reports 8/10 pain in the R shoulder. He states that he is able to sleep a little better but the pain is still persistent. No further questions or concerns.   Therapeutic Exercise:  10 min Time spent reviewing HEP, Frequency of exercises, and proper form. Discussed modifications in recent HEP and discontinued exercises due to pain.   PROM - Shoulder ER   2 x 10 - reps to end range - ~ 5 deg of ER    PROM - Shoulder Flexion  2 x 10 - reps to end range - able to achieve 100 deg  PROM - Shoulder Abd   2 x 10  reps to end range- able to achieve 90 deg   Therapeutic Activity (with intent to improve upward and outward reaching):  AAROM - Seated Pulley    1 x 20 -  Shoulder Flexion  2 x 20 - Shoulder Abduction   AAROM - Shoulder ER  2 x 10 - Seated with Cane   5 min Time spent discussing proper way to navigate stairs in the basement for laundry with use of R hand rail. PT discussed to lighten the load in laundry bag and take multiple trips in order to reduce LOB with heavy sling bag.   Neuromuscular Re-education (with intent to improve scapular posture and quality of muscle contraction for deltoid muscles)   Seated Lat Pull Down   3 x 10 - 10#   Seated Scapular Rows   3 x 10 - 10#   Standing Shoulder Abduction   L: 3 x 10 - 2# DB - in front of mirror   Standing Shoulder Flexion   L: 3 x 10 - 2# DB - in front of mirror   Verbal Cues for proper form and technique in front of a mirror and with tactile cues.   PATIENT EDUCATION:  Education details: Exercise Technique a Person educated: Patient Education method: Medical Illustrator Education comprehension: verbalized understanding and returned demonstration   HOME EXERCISE PROGRAM:  Access Code: [email or web address] URL: [email or web address] Date: 01/02/2024 Prepared by: Lonni Pall  Exercises - Seated Biceps Curl  - 1 x daily - 7 x weekly - 3 sets - 10-20 reps - Seated Scapular Retraction  - 1 x daily - 7 x weekly - 3 sets - 10-20 reps - Seated Gripping Towel  - 1 x daily - 7 x weekly - 3 sets - 10-12 reps - Circular Shoulder Pendulum with Table Support  - 1 x daily - 7 x weekly - 3 sets - 20 reps - Supine Shoulder Flexion with Dowel  - 2 x daily - 7 x weekly - 3 sets - 10-12 reps - Supine Shoulder External Rotation with Dowel  - 2 x daily - 7 x weekly - 3 sets - 10-12 reps - Supine Shoulder Horizontal Abduction Adduction AAROM with Dowel  - 2 x daily - 7 x weekly - 3 sets - 10-12 reps - Seated Shoulder Flexion Towel Slide at Table Top  - 2 x daily - 7 x weekly - 3 sets - 10-12 reps - Seated Shoulder Abduction Towel Slide at Table Top  - 7 x weekly - 3 sets - 10-  reps  ASSESSMENT:  CLINICAL IMPRESSION: Jodie returns to OPPT for management of s/p ORIF on 11/15/2023. Patient is 7 weeks s/p surgery. Patient tolerated progression from AAROM to AROM with minimal resistance. Time spent working on scapular muscles and improved quality of shoulder flexion/abduction. Extensive time discussing proper technique for  HEP and safe navigation of stairs with sling laundry bag in LLE. Patient still presenting with shoulder shrug when prompted to perform shoulder abd past 30 deg. Time spent in front of the mirror in order to provide visual feedback on proper technique and to avoid trapezius compensation. New updates to HEP (See above) per patient tolerance and pain. Patient still presents with severe deficits in RUE strength, ROM and pain limiting his full participation in recreational and ADL tasks. PT plan to progress AAROM to AROM to prevent scar adhesion or capsular adhesions. Based on today's performance, pt will continue to benefit from skilled PT in order to facilitate return to PLOF and improve QoL.      Patient presenting with improved swelling in distal UE and improved bicep length. Patient with intermittent sling positioning of R arm however able to relax and also full elbow extension when verbally cued. Extensive time spent on PT education and return demonstration of AAROM for shoulder flexion, abduction and external rotation. Pt able to achieve 90 deg in R shoulder flexion/abduction with assist from pulley and rail slide. Patient still presenting with severe limitations in shoulder ER.Patient's current deficits are limiting him from full participation in iADLs and recreational activities. Updated HEP to include AAROM exercise.   OBJECTIVE IMPAIRMENTS: decreased activity tolerance, decreased endurance, decreased ROM, decreased strength, increased edema, increased fascial restrictions, increased muscle spasms, impaired sensation, and pain.   ACTIVITY LIMITATIONS:  carrying, lifting, transfers, bathing, dressing, reach over head, and hygiene/grooming  PARTICIPATION LIMITATIONS: meal prep, cleaning, laundry, community activity, and occupation  PERSONAL FACTORS: Age, Past/current experiences, Time since onset of injury/illness/exacerbation, and 3+ comorbidities: Hx of COPD, ACDF, recent humeral fixation are also affecting patient's functional outcome.   REHAB POTENTIAL: Good  CLINICAL DECISION MAKING: Evolving/moderate complexity  EVALUATION COMPLEXITY: Moderate   GOALS: Goals reviewed with patient? Yes  SHORT TERM GOALS: Target date: 02/07/2024  Pt will be independent with HEP to improve strength and decrease shoulder pain to improve pain-free function at home and work. Baseline: Initial HEP provided  Goal status: INITIAL   LONG TERM GOALS: Target date: 0303/2026  Pt will decrease quick DASH score by at least 8% in order to demonstrate clinically significant reduction in disability with UE ADLs.  Baseline: 12/27/2023: TBD Goal status: INITIAL  2.  Pt will decrease worst shoulder pain by at least 3 points on the NPRS in order to demonstrate clinically significant reduction in shoulder pain. Baseline: 12/27/2023: 10/10 NPS Goal status: INITIAL  3.  Pt will increase R shoulder flexion/abduction AROM to 140/180 respectively and pain free in order to demonstrate limb symmetry for OH lifting and ADLs.  Baseline: 12/27/2023:   Right Left  Shoulder    Flexion NT/80 deg  140  Extension    Abduction NT/40 deg 160   Goal status: INITIAL  4. Pt will increase R shoulder strength by at least 1/2 MMT grade in order to demonstrate improvement in strength and function         Baseline: 12/27/2023:  Shoulder   Flexion NT 4  Extension    Abduction NT 4  External rotation NT 4  Internal rotation NT 4   Goal status: INITIAL  PLAN:  PT FREQUENCY: 1-2x/week  PT DURATION: 12 weeks  PLANNED INTERVENTIONS: [id number]- PT Re-evaluation, 97750-  Physical Performance Testing, 97110-Therapeutic exercises, 97530- Therapeutic activity, [id number]- Neuromuscular re-education, 97535- Self Care, [id number]- Manual therapy, Patient/Family education, Joint mobilization, Cryotherapy, and Moist heat  PLAN FOR NEXT SESSION: Review HEP, Initiate PROM/AAROM  for R shoulder, Education on sling wear schedule per MD Protocol, Elbow and Wrist Mobility   Lonni Pall PT, DPT Physical Therapist- [place]  01/04/2024, 7:32 PM

## 2024-01-05 ENCOUNTER — Ambulatory Visit

## 2024-01-10 ENCOUNTER — Ambulatory Visit

## 2024-01-10 DIAGNOSIS — M25611 Stiffness of right shoulder, not elsewhere classified: Secondary | ICD-10-CM

## 2024-01-10 DIAGNOSIS — M6281 Muscle weakness (generalized): Secondary | ICD-10-CM

## 2024-01-10 DIAGNOSIS — M79621 Pain in right upper arm: Secondary | ICD-10-CM

## 2024-01-10 NOTE — Therapy (Signed)
 " OUTPATIENT PHYSICAL THERAPY SHOULDER/ELBOW TREATMENT  Patient Name: Destiny Wright MRN: [id number] DOB:[birth year], 58 y.o., adult Today's Date: 01/10/2024  END OF SESSION:  PT End of Session - 01/10/24 1431     Visit Number 4    Number of Visits 24    Date for Recertification  03/20/24    Authorization Type Auth 16 PT visits  12/9 -02/21/24  [id number]    Authorization - Number of Visits 16    PT Start Time 1430    PT Stop Time 1515    PT Time Calculation (min) 45 min    Activity Tolerance Patient tolerated treatment well    Behavior During Therapy WFL for tasks assessed/performed            Past Medical History:  Diagnosis Date   Anxiety    Asthma    Basal cell carcinoma, arm    (bilateral forearm)   COPD (chronic obstructive pulmonary disease) (HCC)    Depression    Dyspepsia    Endometriosis    Gender dysphoria    GERD (gastroesophageal reflux disease)    Heart murmur 2018   echocardiogram   Hepatic steatosis    Hepatitis    History of Helicobacter pylori infection 2021   Hyperlipidemia    Hypertension    Hypothyroidism    Neuropathy of left foot    Osteoarthritis    PONV (postoperative nausea and vomiting)    PVC (premature ventricular contraction) 2008   echocardiogram   Stage 3b chronic kidney disease (CKD) (HCC)    Vitamin D deficiency    Past Surgical History:  Procedure Laterality Date   ANTERIOR CERVICAL DECOMP/DISCECTOMY FUSION  2014   BREAST BIOPSY Right 2012   COLONOSCOPY  2000   ESOPHAGOGASTRODUODENOSCOPY (EGD) WITH PROPOFOL  N/A 10/16/2019   Procedure: ESOPHAGOGASTRODUODENOSCOPY (EGD) WITH PROPOFOL ;  Surgeon: Unk Corinn Skiff, MD;  Location: [facility];  Service: Gastroenterology;  Laterality: N/A;   HAMMER TOE SURGERY Left 2015   KNEE ARTHROSCOPY Right 2004   arthritis   LAPAROSCOPY  2000   endometriosis   LYSIS OF ADHESION N/A 07/01/2022   Procedure: LYSIS OF ADHESION;  Surgeon: Verdon Keen, MD;  Location: [facility];   Service: Gynecology;  Laterality: N/A;   NASAL SEPTUM SURGERY  2009   ORIF SHOULDER FRACTURE Right 11/15/2023   Procedure: OPEN REDUCTION INTERNAL FIXATION (ORIF) SHOULDER FRACTURE;  Surgeon: Genelle Standing, MD;  Location: [facility];  Service: Orthopedics;  Laterality: Right;  RIGHT SHOULDER GREATER TUBEROSITY FRACTURE FIXATION   SIMPLE MASTECTOMY Bilateral 2017   with nipple graft   TONSILLECTOMY  2009   TOTAL ABDOMINAL HYSTERECTOMY W/ BILATERAL SALPINGOOPHORECTOMY  2006   TRACHELECTOMY N/A 07/01/2022   Procedure: ROBOTIC ASSISTED TRACHELECTOMY;  Surgeon: Verdon Keen, MD;  Location: [facility];  Service: Gynecology;  Laterality: N/A;   UPPER GI ENDOSCOPY  2003   Patient Active Problem List   Diagnosis Date Noted   Closed displaced fracture of greater tuberosity of right humerus 11/15/2023   Anterior dislocation of right shoulder 10/22/2023   Dyspepsia    Murmur, cardiac 05/14/2016   Premature ventricular contractions 05/14/2016   Endometriosis 08/31/2013    PCP: Epifanio Alm SQUIBB, MD  REFERRING PROVIDER:  Genelle Standing, MD     REFERRING DIAG:  S42.91XA (ICD-10-CM) - Closed fracture of right shoulder, initial encounter    RATIONALE FOR EVALUATION AND TREATMENT: Rehabilitation  THERAPY DIAG: Pain in right upper arm  Stiffness of right shoulder, not elsewhere classified  Muscle weakness (generalized)  ONSET DATE: s/p Right greater tuberosity fracture ORIF 11/15/2023   FOLLOW-UP APPT SCHEDULED WITH REFERRING PROVIDER: Yes    SUBJECTIVE:                                                                                                                                                                                         SUBJECTIVE STATEMENT:    Patient reports to OPPT with R shoulder pain   PERTINENT HISTORY:   Destiny Wright is 58 y.o female presenting with R shoulder following Right greater tuberosity fracture ORIF 11/15/2023. Patient reports that there is  increased swellign in the upper R arm and a sharp, stinging pain. He was told by Dr. Leonce reported increased fluid in the upper arm. There is still some numbness in the anterior and lateral upper arm below the incision. Patient has upper extremity immobilized in a sling since Oct 4th. He has been working on grip strength and active elbow extension/flexion.   Hand Dominance: Right   Denies bilateral numbness/tingling, chills, fevers, nausea.   Imaging (Per Chart Review):  EXAM: 1 VIEW XRAY OF THE RIGHT SHOULDER 11/30/2023 03:18:13 PM   COMPARISON: 15 days ago status post surgical internal fixation of proximal right humeral fracture no dislocation is noted.   CLINICAL HISTORY: post op   FINDINGS:   BONES AND JOINTS: Status post surgical internal fixation of proximal right humeral fracture. No dislocation is noted. The glenohumeral joint is normally aligned. No acute fracture. The [employer] joint is unremarkable in appearance.   SOFT TISSUES: No abnormal calcifications. Visualized lung is unremarkable.   IMPRESSION: 1. Status post surgical internal fixation of proximal right humeral fracture. 2. No dislocation.   Electronically signed by: Lynwood Seip MD 11/30/2023 04:32 PM EST RP Workstation: [id number]  PAIN:  Pain Intensity: Present: 8/10, Best: 8/10, Worst: 10/10 Pain location: R Shoulder Pain Quality: intermittent  Radiating: No  Numbness/Tingling: Yes Aggravating factors: Random Onset Relieving factors: Desensitizing, rubbing the shoulder   PRECAUTIONS: Shoulder PROM, AROM when cleared   Per [facility] UE ORIF Protocol: (Discontinued - 01/02/2024)  Weeks 4-8:  Begin FULL ROM   PROMAAROMAROM   Sling as needed for comfort only   May begin isometric strengthening   Week 6 - Light resistance strengthening progression may begin    WEIGHT BEARING RESTRICTIONS: Yes    FALLS: Has patient fallen in last 6 months? Yes. Number of falls 1  Living Environment Lives  with: lives alone Lives in: House/apartment Stairs: Yes: Internal: 10-13 steps; on right going up Has following equipment at home: None  Prior level of function: Independent  Occupational demands:Total Disability   Hobbies: Biking (E-bike)  Patient Goals: I would like to improve ROM in the my R arm   OBJECTIVE:   Patient Surveys  QuickDASH:  Cognition Patient is oriented to person, place, and time.  Recent memory is intact.  Remote memory is intact.  Attention span and concentration are intact.  Expressive speech is intact.  Patient's fund of knowledge is within normal limits for educational level.    Gross Musculoskeletal Assessment Tremor: None Bulk: Normal Tone: Normal  Gait WNL  Posture Seated: R arm internally rotated and with elbow contracture out of sling   Cervical Screen AROM: WFL and painless with overpressure in all planes  AROM AROM (Normal range in degrees) AROM/PROM   Cervical  Flexion (50)   Extension (80)   Right lateral flexion (45)   Left lateral flexion (45)   Right rotation (85)   Left rotation (85)    Right Left  Shoulder    Flexion NT/80 deg  140  Extension    Abduction NT/40 deg 160  External Rotation    Internal Rotation  WNL  Hands Behind Head  WNL  Hands Behind Back        Elbow  WNL  Flexion  WNL  Extension    Pronation    Supination    (* = pain; Blank rows = not tested)  UE MMT: MMT (out of 5) Right Left   Cervical (isometric)  Flexion WNL  Extension WNL  Lateral Flexion WNL WNL  Rotation WNL WNL      Shoulder   Flexion NT 4  Extension    Abduction NT 4  External rotation NT 4  Internal rotation NT 4  Horizontal abduction    Horizontal adduction    Lower Trapezius    Rhomboids    `    Elbow  Flexion NT 5  Extension NT 5  Pronation  5  Supination  5      (* = pain; Blank rows = not tested)  Sensation Decreased Sensation along lateral aspect of upper Arm.   Reflexes Deferred    Palpation Location LEFT  RIGHT           Subocciptials    Cervical paraspinals    Upper Trapezius  1  Levator Scapulae  1  Rhomboid Major/Minor    Sternoclavicular joint    Acromioclavicular joint    Coracoid process    Long head of biceps    Supraspinatus    Infraspinatus    Subscapularis    Teres Minor    Teres Major    Pectoralis Major    Pectoralis Minor    Anterior Deltoid  1  Lateral Deltoid  1  Posterior Deltoid  1  Latissimus Dorsi    Sternocleidomastoid    (Blank rows = not tested) Graded on 0-4 scale (0 = no pain, 1 = pain, 2 = pain with wincing/grimacing/flinching, 3 = pain with withdrawal, 4 = unwilling to allow palpation), (Blank rows = not tested)   Passive Accessory Intervertebral Motion Pt denies reproduction of shoulder pain with CPA C2-T7 and UPA bilaterally C2-T7. Generally, hypomobile throughout  Accessory Motions/Glides Deferred  Muscle Length Testing Deferred  SPECIAL TESTS Deferred  TODAY'S TREATMENT  DATE: 01/10/2024  Per Dr. Genelle, MD (12/28/2023): At this time he will be allowed to do active range of motion as tolerated. I would like have no lifting greater than 15 pounds. I will plan to see him in 6 weeks for reassessment  Subjective:.Patient reports 8/10 pain in the anterior  shoulder near the R pectoral. Patient reports that the nerve pain in the lateral shoulder has not improved in the R shoulder. He reports the pain in still feels like sharp shooting pain and very tender to palpate. No further questions or concerns.   Therapeutic Exercise:  Door Way Pec Stretch - Elbow at 30 abd  30s/bout x 3 in order to improve ROM and tissue extensibility  Resisted Pronation/Supination  1 x 10 - 8#   2 x 10 - 5#   PROM - Shoulder ER  2 x 10 - With yd Stick   Standing Shoulder extension (low row)   2 x 10 - Green TB   1 x 10 - Red TB    Seated Scapular Rows   2 x 10 - Green TB   1 x 10 - Red TB   Time spent discussing HEP,  frequency and confirming proper form   Verbal Cues for proper form and technique in front of a mirror and with tactile cues.   Therapeutic Activity (with intent to improve upward and outward reaching):  UBE - 5 min - 2.5 min Fwd, 2.5 min Retro - Level 10-8 for UE  strength and muscular endurance; PT manually adjusted resistance throughout to patient's tolerance.   PROM Shoulder Flexion   2 x 10 - PT facilitated end range within patient tolerance  PROM Shoulder Abduction  2 x 10 - PT facilitated end range within patient tolerance   AAROM - Seated Pulley    1 x 20 - Shoulder Flexion  1 x 20 - Shoulder Abduction   PATIENT EDUCATION:  Education details: Exercise Technique a Person educated: Patient Education method: Medical Illustrator Education comprehension: verbalized understanding and returned demonstration   HOME EXERCISE PROGRAM:  Access Code: [email or web address] URL: [email or web address] Date: 01/02/2024 Prepared by: Lonni Pall  Exercises - Seated Biceps Curl  - 1 x daily - 7 x weekly - 3 sets - 10-20 reps - Seated Scapular Retraction  - 1 x daily - 7 x weekly - 3 sets - 10-20 reps - Seated Gripping Towel  - 1 x daily - 7 x weekly - 3 sets - 10-12 reps - Circular Shoulder Pendulum with Table Support  - 1 x daily - 7 x weekly - 3 sets - 20 reps - Supine Shoulder Flexion with Dowel  - 2 x daily - 7 x weekly - 3 sets - 10-12 reps - Supine Shoulder External Rotation with Dowel  - 2 x daily - 7 x weekly - 3 sets - 10-12 reps - Supine Shoulder Horizontal Abduction Adduction AAROM with Dowel  - 2 x daily - 7 x weekly - 3 sets - 10-12 reps - Seated Shoulder Flexion Towel Slide at Table Top  - 2 x daily - 7 x weekly - 3 sets - 10-12 reps - Seated Shoulder Abduction Towel Slide at Table Top  - 7 x weekly - 3 sets - 10- reps  ASSESSMENT:  CLINICAL IMPRESSION: Destiny Wright returns to OPPT for management of s/p ORIF on 11/15/2023. Patient is 7 weeks s/p surgery. Patient  tolerated progression from AAROM to AROM with minimal resistance. Time spent working on scapular muscles and improved quality of shoulder flexion/abduction. Patient still consistently able to reach 90 deg of abduction and flexion with AAROM/PROM however he is unable to reach past 30 deg of shoulder flex/abd due to pain. Patient's pain at beginning of session consistent with pectoral tightness; PT educated patient on doorway stretch with good return demonstration.  Patient still presents with severe deficits in RUE strength, ROM and pain limiting his full participation in recreational and ADL tasks. PT plan to progress AAROM to AROM to prevent scar adhesion or capsular adhesions. Based on today's performance, pt will continue to benefit from skilled PT in order to facilitate return to PLOF and improve QoL.   OBJECTIVE IMPAIRMENTS: decreased activity tolerance, decreased endurance, decreased ROM, decreased strength, increased edema, increased fascial restrictions, increased muscle spasms, impaired sensation, and pain.   ACTIVITY LIMITATIONS: carrying, lifting, transfers, bathing, dressing, reach over head, and hygiene/grooming  PARTICIPATION LIMITATIONS: meal prep, cleaning, laundry, community activity, and occupation  PERSONAL FACTORS: Age, Past/current experiences, Time since onset of injury/illness/exacerbation, and 3+ comorbidities: Hx of COPD, ACDF, recent humeral fixation are also affecting patient's functional outcome.   REHAB POTENTIAL: Good  CLINICAL DECISION MAKING: Evolving/moderate complexity  EVALUATION COMPLEXITY: Moderate   GOALS: Goals reviewed with patient? Yes  SHORT TERM GOALS: Target date: 02/07/2024  Pt will be independent with HEP to improve strength and decrease shoulder pain to improve pain-free function at home and work. Baseline: Initial HEP provided  Goal status: INITIAL   LONG TERM GOALS: Target date: 0303/2026  Pt will decrease quick DASH score by at least 8%  in order to demonstrate clinically significant reduction in disability with UE ADLs.  Baseline: 12/27/2023: TBD Goal status: INITIAL  2.  Pt will decrease worst shoulder pain by at least 3 points on the NPRS in order to demonstrate clinically significant reduction in shoulder pain. Baseline: 12/27/2023: 10/10 NPS Goal status: INITIAL  3.  Pt will increase R shoulder flexion/abduction AROM to 140/180 respectively and pain free in order to demonstrate limb symmetry for OH lifting and ADLs.  Baseline: 12/27/2023:   Right Left  Shoulder    Flexion NT/80 deg  140  Extension    Abduction NT/40 deg 160   Goal status: INITIAL  4. Pt will increase R shoulder strength by at least 1/2 MMT grade in order to demonstrate improvement in strength and function         Baseline: 12/27/2023:  Shoulder   Flexion NT 4  Extension    Abduction NT 4  External rotation NT 4  Internal rotation NT 4   Goal status: INITIAL  PLAN:  PT FREQUENCY: 1-2x/week  PT DURATION: 12 weeks  PLANNED INTERVENTIONS: [id number]- PT Re-evaluation, 97750- Physical Performance Testing, 97110-Therapeutic exercises, 97530- Therapeutic activity, 97112- Neuromuscular re-education, 97535- Self Care, [id number]- Manual therapy, Patient/Family education, Joint mobilization, Cryotherapy, and Moist heat  PLAN FOR NEXT SESSION: Review HEP, Initiate PROM/AAROM for R shoulder, Education on sling wear schedule per MD Protocol, Elbow and Wrist Mobility   Lonni Pall PT, DPT Physical Therapist- [place]  01/10/2024, 4:24 PM  "

## 2024-01-16 ENCOUNTER — Ambulatory Visit

## 2024-01-17 ENCOUNTER — Ambulatory Visit

## 2024-01-17 DIAGNOSIS — M79621 Pain in right upper arm: Secondary | ICD-10-CM

## 2024-01-17 DIAGNOSIS — M25611 Stiffness of right shoulder, not elsewhere classified: Secondary | ICD-10-CM

## 2024-01-17 DIAGNOSIS — M6281 Muscle weakness (generalized): Secondary | ICD-10-CM

## 2024-01-17 NOTE — Therapy (Signed)
 " OUTPATIENT PHYSICAL THERAPY SHOULDER/ELBOW TREATMENT  Patient Name: Destiny Wright MRN: [id number] DOB:04-04-1965, 58 y.o., adult Today's Date: 01/17/2024  END OF SESSION:  PT End of Session - 01/17/24 1646     Visit Number 5    Number of Visits 24    Date for Recertification  03/20/24    Authorization Type Auth 16 PT visits  12/9 -02/21/24  [id number]    Authorization - Visit Number 5    Authorization - Number of Visits 16    PT Start Time 1646    PT Stop Time 1730    PT Time Calculation (min) 44 min    Activity Tolerance Patient tolerated treatment well    Behavior During Therapy WFL for tasks assessed/performed            Past Medical History:  Diagnosis Date   Anxiety    Asthma    Basal cell carcinoma, arm    (bilateral forearm)   COPD (chronic obstructive pulmonary disease) (HCC)    Depression    Dyspepsia    Endometriosis    Gender dysphoria    GERD (gastroesophageal reflux disease)    Heart murmur 2018   echocardiogram   Hepatic steatosis    Hepatitis    History of Helicobacter pylori infection 2021   Hyperlipidemia    Hypertension    Hypothyroidism    Neuropathy of left foot    Osteoarthritis    PONV (postoperative nausea and vomiting)    PVC (premature ventricular contraction) 2008   echocardiogram   Stage 3b chronic kidney disease (CKD) (HCC)    Vitamin D deficiency    Past Surgical History:  Procedure Laterality Date   ANTERIOR CERVICAL DECOMP/DISCECTOMY FUSION  2014   BREAST BIOPSY Right 2012   COLONOSCOPY  2000   ESOPHAGOGASTRODUODENOSCOPY (EGD) WITH PROPOFOL  N/A 10/16/2019   Procedure: ESOPHAGOGASTRODUODENOSCOPY (EGD) WITH PROPOFOL ;  Surgeon: Unk Corinn Skiff, MD;  Location: [facility];  Service: Gastroenterology;  Laterality: N/A;   HAMMER TOE SURGERY Left 2015   KNEE ARTHROSCOPY Right 2004   arthritis   LAPAROSCOPY  2000   endometriosis   LYSIS OF ADHESION N/A 07/01/2022   Procedure: LYSIS OF ADHESION;  Surgeon: Verdon Keen, MD;  Location: [facility];  Service: Gynecology;  Laterality: N/A;   NASAL SEPTUM SURGERY  2009   ORIF SHOULDER FRACTURE Right 11/15/2023   Procedure: OPEN REDUCTION INTERNAL FIXATION (ORIF) SHOULDER FRACTURE;  Surgeon: Genelle Standing, MD;  Location: [facility];  Service: Orthopedics;  Laterality: Right;  RIGHT SHOULDER GREATER TUBEROSITY FRACTURE FIXATION   SIMPLE MASTECTOMY Bilateral 2017   with nipple graft   TONSILLECTOMY  2009   TOTAL ABDOMINAL HYSTERECTOMY W/ BILATERAL SALPINGOOPHORECTOMY  2006   TRACHELECTOMY N/A 07/01/2022   Procedure: ROBOTIC ASSISTED TRACHELECTOMY;  Surgeon: Verdon Keen, MD;  Location: [facility];  Service: Gynecology;  Laterality: N/A;   UPPER GI ENDOSCOPY  2003   Patient Active Problem List   Diagnosis Date Noted   Closed displaced fracture of greater tuberosity of right humerus 11/15/2023   Anterior dislocation of right shoulder 10/22/2023   Dyspepsia    Murmur, cardiac 05/14/2016   Premature ventricular contractions 05/14/2016   Endometriosis 08/31/2013    PCP: Epifanio Alm SQUIBB, MD  REFERRING PROVIDER:  Genelle Standing, MD     REFERRING DIAG:  S42.91XA (ICD-10-CM) - Closed fracture of right shoulder, initial encounter    RATIONALE FOR EVALUATION AND TREATMENT: Rehabilitation  THERAPY DIAG: Pain in right upper arm  Stiffness of right shoulder,  not elsewhere classified  Muscle weakness (generalized)  ONSET DATE: s/p Right greater tuberosity fracture ORIF 11/15/2023   FOLLOW-UP APPT SCHEDULED WITH REFERRING PROVIDER: Yes    SUBJECTIVE:                                                                                                                                                                                         SUBJECTIVE STATEMENT:    Patient reports to OPPT with R shoulder pain   PERTINENT HISTORY:   Destiny Wright is 58 y.o female presenting with R shoulder following Right greater tuberosity fracture ORIF 11/15/2023.  Patient reports that there is increased swellign in the upper R arm and a sharp, stinging pain. He was told by Dr. Leonce reported increased fluid in the upper arm. There is still some numbness in the anterior and lateral upper arm below the incision. Patient has upper extremity immobilized in a sling since Oct 4th. He has been working on grip strength and active elbow extension/flexion.   Hand Dominance: Right   Denies bilateral numbness/tingling, chills, fevers, nausea.   Imaging (Per Chart Review):  EXAM: 1 VIEW XRAY OF THE RIGHT SHOULDER 11/30/2023 03:18:13 PM   COMPARISON: 15 days ago status post surgical internal fixation of proximal right humeral fracture no dislocation is noted.   CLINICAL HISTORY: post op   FINDINGS:   BONES AND JOINTS: Status post surgical internal fixation of proximal right humeral fracture. No dislocation is noted. The glenohumeral joint is normally aligned. No acute fracture. The [facility] joint is unremarkable in appearance.   SOFT TISSUES: No abnormal calcifications. Visualized lung is unremarkable.   IMPRESSION: 1. Status post surgical internal fixation of proximal right humeral fracture. 2. No dislocation.   Electronically signed by: Lynwood Seip MD 11/30/2023 04:32 PM EST RP Workstation: [id number]  PAIN:  Pain Intensity: Present: 8/10, Best: 8/10, Worst: 10/10 Pain location: R Shoulder Pain Quality: intermittent  Radiating: No  Numbness/Tingling: Yes Aggravating factors: Random Onset Relieving factors: Desensitizing, rubbing the shoulder   PRECAUTIONS: Shoulder PROM, AROM when cleared   Per [facility] UE ORIF Protocol: (Discontinued - 01/02/2024)  Weeks 4-8:  Begin FULL ROM   PROMAAROMAROM   Sling as needed for comfort only   May begin isometric strengthening   Week 6 - Light resistance strengthening progression may begin    WEIGHT BEARING RESTRICTIONS: Yes    FALLS: Has patient fallen in last 6 months? Yes. Number of falls  1  Living Environment Lives with: lives alone Lives in: House/apartment Stairs: Yes: Internal: 10-13 steps; on right going up Has following equipment at home: None  Prior level of function: Independent  Occupational demands:Total Disability   Hobbies: Biking (E-bike)   Patient Goals: I would like to improve ROM in the my R arm   OBJECTIVE:   Patient Surveys  QuickDASH:  Cognition Patient is oriented to person, place, and time.  Recent memory is intact.  Remote memory is intact.  Attention span and concentration are intact.  Expressive speech is intact.  Patient's fund of knowledge is within normal limits for educational level.    Gross Musculoskeletal Assessment Tremor: None Bulk: Normal Tone: Normal  Gait WNL  Posture Seated: R arm internally rotated and with elbow contracture out of sling   Cervical Screen AROM: WFL and painless with overpressure in all planes  AROM AROM (Normal range in degrees) AROM/PROM   Cervical  Flexion (50)   Extension (80)   Right lateral flexion (45)   Left lateral flexion (45)   Right rotation (85)   Left rotation (85)    Right Left  Shoulder    Flexion NT/80 deg  140  Extension    Abduction NT/40 deg 160  External Rotation    Internal Rotation  WNL  Hands Behind Head  WNL  Hands Behind Back        Elbow  WNL  Flexion  WNL  Extension    Pronation    Supination    (* = pain; Blank rows = not tested)  UE MMT: MMT (out of 5) Right Left   Cervical (isometric)  Flexion WNL  Extension WNL  Lateral Flexion WNL WNL  Rotation WNL WNL      Shoulder   Flexion NT 4  Extension    Abduction NT 4  External rotation NT 4  Internal rotation NT 4  Horizontal abduction    Horizontal adduction    Lower Trapezius    Rhomboids    `    Elbow  Flexion NT 5  Extension NT 5  Pronation  5  Supination  5      (* = pain; Blank rows = not tested)  Sensation Decreased Sensation along lateral aspect of upper Arm.    Reflexes Deferred   Palpation Location LEFT  RIGHT           Subocciptials    Cervical paraspinals    Upper Trapezius  1  Levator Scapulae  1  Rhomboid Major/Minor    Sternoclavicular joint    Acromioclavicular joint    Coracoid process    Long head of biceps    Supraspinatus    Infraspinatus    Subscapularis    Teres Minor    Teres Major    Pectoralis Major    Pectoralis Minor    Anterior Deltoid  1  Lateral Deltoid  1  Posterior Deltoid  1  Latissimus Dorsi    Sternocleidomastoid    (Blank rows = not tested) Graded on 0-4 scale (0 = no pain, 1 = pain, 2 = pain with wincing/grimacing/flinching, 3 = pain with withdrawal, 4 = unwilling to allow palpation), (Blank rows = not tested)   Passive Accessory Intervertebral Motion Pt denies reproduction of shoulder pain with CPA C2-T7 and UPA bilaterally C2-T7. Generally, hypomobile throughout  Accessory Motions/Glides Deferred  Muscle Length Testing Deferred  SPECIAL TESTS Deferred  TODAY'S TREATMENT  DATE: 01/17/2024  Per Dr. Genelle, MD (12/28/2023): At this time he will be allowed to do active range of motion as tolerated. I would like have no lifting greater than 15 pounds. I will plan to see him in 6 weeks  for reassessment  Subjective:.Patient report consistent 8/10 pain in the R shoulder. He reports that last night there was increased pain and he had to take a hydrocodone  in order to decrease the pain and sleep. Patient reports that he has been adhering to the HEP.  No questions or concerns.   Grip Strength (Seated, Elbow at 90 deg)  R: 55 lb   L: 110 lb  Therapeutic Exercise:  Seated Resisted Pronation/Supination  3 x 10 - 5#   Standing Shoulder Abduction - Dumbbell   3 x 10 - 3# DB    Standing Shoulder Flexion - Dumbbell   3 x 10 - 3# DB   Passive R Shoulder External Rotation - Seated   30s/bout x 3 in order to improve ROM/muscle tension/tissue extensibility  Passive Pectoral Stretch - Straight  Arm - 30 deg abd   30s/bout x 3 in order to improve ROM/muscle tension/tissue extensibility  Therapeutic Activity (with intent to improve upward/outward reaching and carrying):  UBE - 5 min - 2.5 min Fwd, 2.5 min Retro - Level 10-8 for UE  strength and muscular endurance; PT manually adjusted resistance throughout to patient's tolerance.   Suitcase Carry   4 x 10'  - 10# KB   4 x 10' - 15# DB  AAROM - Seated Pulley    1 x 20 - Shoulder Flexion - able to achieve 120 deg   2 x 20 - Shoulder Abduction  PROM - Side Stepping with RUE holding TRX for increased abduction   1 x 10   PROM - Retro Stepping with RUE holding TRX for increased forward flexion   1 x 10    PATIENT EDUCATION:  Education details: Exercise Technique  Person educated: Patient Education method: Medical Illustrator Education comprehension: verbalized understanding and returned demonstration   HOME EXERCISE PROGRAM:  Access Code: [email or web address] URL: [email or web address] Date: 01/02/2024 Prepared by: Lonni Pall  Exercises - Seated Biceps Curl  - 1 x daily - 7 x weekly - 3 sets - 10-20 reps - Seated Scapular Retraction  - 1 x daily - 7 x weekly - 3 sets - 10-20 reps - Seated Gripping Towel  - 1 x daily - 7 x weekly - 3 sets - 10-12 reps - Circular Shoulder Pendulum with Table Support  - 1 x daily - 7 x weekly - 3 sets - 20 reps - Supine Shoulder Flexion with Dowel  - 2 x daily - 7 x weekly - 3 sets - 10-12 reps - Supine Shoulder External Rotation with Dowel  - 2 x daily - 7 x weekly - 3 sets - 10-12 reps - Supine Shoulder Horizontal Abduction Adduction AAROM with Dowel  - 2 x daily - 7 x weekly - 3 sets - 10-12 reps - Seated Shoulder Flexion Towel Slide at Table Top  - 2 x daily - 7 x weekly - 3 sets - 10-12 reps - Seated Shoulder Abduction Towel Slide at Table Top  - 7 x weekly - 3 sets - 10- reps  ASSESSMENT:  CLINICAL IMPRESSION: Destiny Wright returns to OPPT for management of s/p ORIF on  11/15/2023. Patient is 8 weeks s/p surgery. Patient tolerated progression from AAROM to AROM with minimal resistance.Patient with improved forward flexion AAROM using pulley; he is able to achieve 120 deg however pain at end range. Lateral shoulder pain with minimal improvements and patient reports that the swelling that was mentioned by physician has not improved. Grip Strength test today; pt with 55% limb symmetry  and significant weakness in R grip strength in comparison to unaffected side. Self report of on 84.1 / 100 = 84.1 % (0/100 = no disability) QuickDASH indicating severe limitations with ADLs due to RUE deficits. Updated goals to include improvements towards grip strength and QuickDash Score. Patient still presents with severe deficits in RUE strength, ROM and pain limiting his full participation in recreational and ADL tasks such as OH reaching. PT plan to progress AAROM to AROM to prevent scar adhesion or capsular adhesions. Based on today's performance, pt will continue to benefit from skilled PT in order to facilitate return to PLOF and improve QoL.  OBJECTIVE IMPAIRMENTS: decreased activity tolerance, decreased endurance, decreased ROM, decreased strength, increased edema, increased fascial restrictions, increased muscle spasms, impaired sensation, and pain.   ACTIVITY LIMITATIONS: carrying, lifting, transfers, bathing, dressing, reach over head, and hygiene/grooming  PARTICIPATION LIMITATIONS: meal prep, cleaning, laundry, community activity, and occupation  PERSONAL FACTORS: Age, Past/current experiences, Time since onset of injury/illness/exacerbation, and 3+ comorbidities: Hx of COPD, ACDF, recent humeral fixation are also affecting patient's functional outcome.   REHAB POTENTIAL: Good  CLINICAL DECISION MAKING: Evolving/moderate complexity  EVALUATION COMPLEXITY: Moderate   GOALS: Goals reviewed with patient? Yes  SHORT TERM GOALS: Target date: 02/07/2024  Pt will be  independent with HEP to improve strength and decrease shoulder pain to improve pain-free function at home and work. Baseline: Initial HEP provided  Goal status: INITIAL   LONG TERM GOALS: Target date: 0303/2026  Pt will decrease quick DASH score by at least 8% in order to demonstrate clinically significant reduction in disability with UE ADLs.  Baseline: 12/27/2023: TBD; 01/17/2024: 84.1 / 100 = 84.1 % Goal status: INITIAL  2.  Pt will decrease worst shoulder pain by at least 3 points on the NPRS in order to demonstrate clinically significant reduction in shoulder pain. Baseline: 12/27/2023: 10/10 NPS Goal status: INITIAL  3.  Pt will increase R shoulder flexion/abduction AROM to 140/180 respectively and pain free in order to demonstrate limb symmetry for OH lifting and ADLs.  Baseline: 12/27/2023:   Right Left  Shoulder    Flexion NT/80 deg  140  Extension    Abduction NT/40 deg 160   Goal status: INITIAL  4. Pt will increase R shoulder strength by at least 1/2 MMT grade in order to demonstrate improvement in strength and function         Baseline: 12/27/2023:  Shoulder   Flexion NT 4  Extension    Abduction NT 4  External rotation NT 4  Internal rotation NT 4   Goal status: INITIAL  5.  Pt will increase R grip strength to at least 90% limb symmetry in order to demonstrate significant improvements in hand function for cutting, grasping items.  Baseline: 01/17/2024: 55#/110#  Goal status: INITIAL  PLAN:  PT FREQUENCY: 1-2x/week  PT DURATION: 12 weeks  PLANNED INTERVENTIONS: 97164- PT Re-evaluation, 97750- Physical Performance Testing, 97110-Therapeutic exercises, 97530- Therapeutic activity, 97112- Neuromuscular re-education, 97535- Self Care, [id number]- Manual therapy, Patient/Family education, Joint mobilization, Cryotherapy, and Moist heat  PLAN FOR NEXT SESSION: Review HEP, Initiate PROM/AAROM for R shoulder, Education on sling wear schedule per MD Protocol, Elbow and  Wrist Mobility   Lonni Pall PT, DPT Physical Therapist- [place]  01/17/2024, 4:47 PM  "

## 2024-01-18 ENCOUNTER — Ambulatory Visit

## 2024-01-23 ENCOUNTER — Ambulatory Visit: Attending: Orthopaedic Surgery

## 2024-01-23 DIAGNOSIS — M25611 Stiffness of right shoulder, not elsewhere classified: Secondary | ICD-10-CM | POA: Insufficient documentation

## 2024-01-23 DIAGNOSIS — M79621 Pain in right upper arm: Secondary | ICD-10-CM | POA: Insufficient documentation

## 2024-01-23 DIAGNOSIS — M6281 Muscle weakness (generalized): Secondary | ICD-10-CM | POA: Insufficient documentation

## 2024-01-23 NOTE — Therapy (Signed)
 " OUTPATIENT PHYSICAL THERAPY SHOULDER/ELBOW TREATMENT  Patient Name: Destiny Wright MRN: [id number] DOB:12-Apr-1965, 59 y.o., adult Today's Date: 01/23/2024  END OF SESSION:  PT End of Session - 01/23/24 1521     Visit Number 6    Number of Visits 24    Date for Recertification  03/20/24    Authorization Type Auth 16 PT visits  12/9 -02/21/24  [id number]    Authorization - Visit Number 6    Authorization - Number of Visits 16    PT Start Time 1517    PT Stop Time 1600    PT Time Calculation (min) 43 min    Activity Tolerance Patient tolerated treatment well    Behavior During Therapy WFL for tasks assessed/performed            Past Medical History:  Diagnosis Date   Anxiety    Asthma    Basal cell carcinoma, arm    (bilateral forearm)   COPD (chronic obstructive pulmonary disease) (HCC)    Depression    Dyspepsia    Endometriosis    Gender dysphoria    GERD (gastroesophageal reflux disease)    Heart murmur 2018   echocardiogram   Hepatic steatosis    Hepatitis    History of Helicobacter pylori infection 2021   Hyperlipidemia    Hypertension    Hypothyroidism    Neuropathy of left foot    Osteoarthritis    PONV (postoperative nausea and vomiting)    PVC (premature ventricular contraction) 2008   echocardiogram   Stage 3b chronic kidney disease (CKD) (HCC)    Vitamin D deficiency    Past Surgical History:  Procedure Laterality Date   ANTERIOR CERVICAL DECOMP/DISCECTOMY FUSION  2014   BREAST BIOPSY Right 2012   COLONOSCOPY  2000   ESOPHAGOGASTRODUODENOSCOPY (EGD) WITH PROPOFOL  N/A 10/16/2019   Procedure: ESOPHAGOGASTRODUODENOSCOPY (EGD) WITH PROPOFOL ;  Surgeon: Unk Corinn Skiff, MD;  Location: [facility];  Service: Gastroenterology;  Laterality: N/A;   HAMMER TOE SURGERY Left 2015   KNEE ARTHROSCOPY Right 2004   arthritis   LAPAROSCOPY  2000   endometriosis   LYSIS OF ADHESION N/A 07/01/2022   Procedure: LYSIS OF ADHESION;  Surgeon: Verdon Keen, MD;  Location: [facility];  Service: Gynecology;  Laterality: N/A;   NASAL SEPTUM SURGERY  2009   ORIF SHOULDER FRACTURE Right 11/15/2023   Procedure: OPEN REDUCTION INTERNAL FIXATION (ORIF) SHOULDER FRACTURE;  Surgeon: Genelle Standing, MD;  Location: [facility];  Service: Orthopedics;  Laterality: Right;  RIGHT SHOULDER GREATER TUBEROSITY FRACTURE FIXATION   SIMPLE MASTECTOMY Bilateral 2017   with nipple graft   TONSILLECTOMY  2009   TOTAL ABDOMINAL HYSTERECTOMY W/ BILATERAL SALPINGOOPHORECTOMY  2006   TRACHELECTOMY N/A 07/01/2022   Procedure: ROBOTIC ASSISTED TRACHELECTOMY;  Surgeon: Verdon Keen, MD;  Location: [facility];  Service: Gynecology;  Laterality: N/A;   UPPER GI ENDOSCOPY  2003   Patient Active Problem List   Diagnosis Date Noted   Closed displaced fracture of greater tuberosity of right humerus 11/15/2023   Anterior dislocation of right shoulder 10/22/2023   Dyspepsia    Murmur, cardiac 05/14/2016   Premature ventricular contractions 05/14/2016   Endometriosis 08/31/2013    PCP: Epifanio Alm SQUIBB, MD  REFERRING PROVIDER:  Genelle Standing, MD     REFERRING DIAG:  S42.91XA (ICD-10-CM) - Closed fracture of right shoulder, initial encounter    RATIONALE FOR EVALUATION AND TREATMENT: Rehabilitation  THERAPY DIAG: Pain in right upper arm  Stiffness of right shoulder,  not elsewhere classified  Muscle weakness (generalized)  ONSET DATE: s/p Right greater tuberosity fracture ORIF 11/15/2023   FOLLOW-UP APPT SCHEDULED WITH REFERRING PROVIDER: Yes    SUBJECTIVE:                                                                                                                                                                                         SUBJECTIVE STATEMENT:    Patient reports to OPPT with R shoulder pain   PERTINENT HISTORY:   Destiny Wright is 59 y.o female presenting with R shoulder following Right greater tuberosity fracture ORIF 11/15/2023.  Patient reports that there is increased swellign in the upper R arm and a sharp, stinging pain. He was told by Dr. Leonce reported increased fluid in the upper arm. There is still some numbness in the anterior and lateral upper arm below the incision. Patient has upper extremity immobilized in a sling since Oct 4th. He has been working on grip strength and active elbow extension/flexion.   Hand Dominance: Right   Denies bilateral numbness/tingling, chills, fevers, nausea.   Imaging (Per Chart Review):  EXAM: 1 VIEW XRAY OF THE RIGHT SHOULDER 11/30/2023 03:18:13 PM   COMPARISON: 15 days ago status post surgical internal fixation of proximal right humeral fracture no dislocation is noted.   CLINICAL HISTORY: post op   FINDINGS:   BONES AND JOINTS: Status post surgical internal fixation of proximal right humeral fracture. No dislocation is noted. The glenohumeral joint is normally aligned. No acute fracture. The [facility] joint is unremarkable in appearance.   SOFT TISSUES: No abnormal calcifications. Visualized lung is unremarkable.   IMPRESSION: 1. Status post surgical internal fixation of proximal right humeral fracture. 2. No dislocation.   Electronically signed by: Lynwood Seip MD 11/30/2023 04:32 PM EST RP Workstation: [id number]  PAIN:  Pain Intensity: Present: 8/10, Best: 8/10, Worst: 10/10 Pain location: R Shoulder Pain Quality: intermittent  Radiating: No  Numbness/Tingling: Yes Aggravating factors: Random Onset Relieving factors: Desensitizing, rubbing the shoulder   PRECAUTIONS: Shoulder PROM, AROM when cleared   Per [facility] UE ORIF Protocol: (Discontinued - 01/02/2024)  Weeks 4-8:  Begin FULL ROM   PROMAAROMAROM   Sling as needed for comfort only   May begin isometric strengthening   Week 6 - Light resistance strengthening progression may begin    WEIGHT BEARING RESTRICTIONS: Yes    FALLS: Has patient fallen in last 6 months? Yes. Number of falls  1  Living Environment Lives with: lives alone Lives in: House/apartment Stairs: Yes: Internal: 10-13 steps; on right going up Has following equipment at home: None  Prior level of function: Independent  Occupational demands:Total Disability   Hobbies: Biking (E-bike)   Patient Goals: I would like to improve ROM in the my R arm   OBJECTIVE:   Patient Surveys  QuickDASH:  Cognition Patient is oriented to person, place, and time.  Recent memory is intact.  Remote memory is intact.  Attention span and concentration are intact.  Expressive speech is intact.  Patient's fund of knowledge is within normal limits for educational level.    Gross Musculoskeletal Assessment Tremor: None Bulk: Normal Tone: Normal  Gait WNL  Posture Seated: R arm internally rotated and with elbow contracture out of sling   Cervical Screen AROM: WFL and painless with overpressure in all planes  AROM AROM (Normal range in degrees) AROM/PROM   Cervical  Flexion (50)   Extension (80)   Right lateral flexion (45)   Left lateral flexion (45)   Right rotation (85)   Left rotation (85)    Right Left  Shoulder    Flexion NT/80 deg  140  Extension    Abduction NT/40 deg 160  External Rotation    Internal Rotation  WNL  Hands Behind Head  WNL  Hands Behind Back        Elbow  WNL  Flexion  WNL  Extension    Pronation    Supination    (* = pain; Blank rows = not tested)  UE MMT: MMT (out of 5) Right Left   Cervical (isometric)  Flexion WNL  Extension WNL  Lateral Flexion WNL WNL  Rotation WNL WNL      Shoulder   Flexion NT 4  Extension    Abduction NT 4  External rotation NT 4  Internal rotation NT 4  Horizontal abduction    Horizontal adduction    Lower Trapezius    Rhomboids    `    Elbow  Flexion NT 5  Extension NT 5  Pronation  5  Supination  5      (* = pain; Blank rows = not tested)  Sensation Decreased Sensation along lateral aspect of upper Arm.    Reflexes Deferred   Palpation Location LEFT  RIGHT           Subocciptials    Cervical paraspinals    Upper Trapezius  1  Levator Scapulae  1  Rhomboid Major/Minor    Sternoclavicular joint    Acromioclavicular joint    Coracoid process    Long head of biceps    Supraspinatus    Infraspinatus    Subscapularis    Teres Minor    Teres Major    Pectoralis Major    Pectoralis Minor    Anterior Deltoid  1  Lateral Deltoid  1  Posterior Deltoid  1  Latissimus Dorsi    Sternocleidomastoid    (Blank rows = not tested) Graded on 0-4 scale (0 = no pain, 1 = pain, 2 = pain with wincing/grimacing/flinching, 3 = pain with withdrawal, 4 = unwilling to allow palpation), (Blank rows = not tested)   Passive Accessory Intervertebral Motion Pt denies reproduction of shoulder pain with CPA C2-T7 and UPA bilaterally C2-T7. Generally, hypomobile throughout  Accessory Motions/Glides Deferred  Muscle Length Testing Deferred  SPECIAL TESTS Deferred  TODAY'S TREATMENT  DATE: 01/23/2024  Per Dr. Genelle, MD (12/28/2023): At this time he will be allowed to do active range of motion as tolerated. I would like have no lifting greater than 15 pounds. I will plan to see him in 6 weeks  for reassessment  Subjective:.Patient report consistent 8/10 pain in the R shoulder. Patient reports that the frequency of shoulder nerve pain has decreased but the intensity remains the same. Patient unable to do the HEP at 100% due to upper respiratory infection. No further questions or concerns    He reports that last night there was increased pain and he had to take a hydrocodone  in order to decrease the pain and sleep. Patient reports that he has been adhering to the HEP.  No questions or concerns.   Therapeutic Exercise:  Standing R Shoulder ER against resistance   3 x 12 - Red TB   Standing Weighted Shoulder Pendulums 3 x 20 - CW/CCW ea dir - 4# AW donned on wrist   Supine Bicep Stretch - -  Straight arm @ 30 deg abd Holding 4# AW  2 min   Passive R Shoulder External Rotation - Seated   30s/bout x 3 in order to improve ROM/muscle tension/tissue extensibility  Passive Pectoral Stretch - Straight Arm - 60 deg abd   30s/bout x 3 in order to improve ROM/muscle tension/tissue extensibility  Therapeutic Activity (with intent to improve upward/outward reaching and carrying):  UBE - 5 min - 2.5 min Fwd, 2.5 min Retro - Level 10-8 for UE  strength and muscular endurance; PT manually adjusted resistance throughout to patient's tolerance.   AAROM - Seated Pulley    1 x 20 - Shoulder Flexion - able to achieve 120 deg   1 x 20 - Shoulder Abduction  AAROM - Foam Roller against the Wall - Shoulder Flexion   2 x 10  AAROM - Reaching OH with Dowel - BUE   2 x 10   Donning Backpack Practice (Back Pack 8#)  5x min - pt able to donn back pack on the R side with assistance using LUE   PATIENT EDUCATION:  Education details: Exercise Technique  Person educated: Patient Education method: Medical Illustrator Education comprehension: verbalized understanding and returned demonstration   HOME EXERCISE PROGRAM:  Access Code: [email or web address] URL: [email or web address] Date: 01/02/2024 Prepared by: Lonni Pall  Exercises - Seated Biceps Curl  - 1 x daily - 7 x weekly - 3 sets - 10-20 reps - Seated Scapular Retraction  - 1 x daily - 7 x weekly - 3 sets - 10-20 reps - Seated Gripping Towel  - 1 x daily - 7 x weekly - 3 sets - 10-12 reps - Circular Shoulder Pendulum with Table Support  - 1 x daily - 7 x weekly - 3 sets - 20 reps - Supine Shoulder Flexion with Dowel  - 2 x daily - 7 x weekly - 3 sets - 10-12 reps - Supine Shoulder External Rotation with Dowel  - 2 x daily - 7 x weekly - 3 sets - 10-12 reps - Supine Shoulder Horizontal Abduction Adduction AAROM with Dowel  - 2 x daily - 7 x weekly - 3 sets - 10-12 reps - Seated Shoulder Flexion Towel Slide at Table Top  -  2 x daily - 7 x weekly - 3 sets - 10-12 reps - Seated Shoulder Abduction Towel Slide at Table Top  - 7 x weekly - 3 sets - 10- reps  ASSESSMENT:  CLINICAL IMPRESSION: Jodie returns to OPPT for management of s/p ORIF on 11/15/2023. Patient is 9 weeks s/p surgery. Patient continues AAROM exercises with main focus on improving OH reach. Patient able to reach 120 deg in R shoulder flexion with use of dowel and  foam roller on the wall.  PT will continue to focus on improving R shoulder/elbow ROM and proceed to gradually improve RUE strength. Good ability to donn backpack with R strap and minimal to no pain. Patient still presents with severe deficits in RUE strength, ROM and pain limiting his full participation in recreational and ADL tasks such as OH reaching. PT plan to progress AAROM to AROM to prevent scar adhesion or capsular adhesions. Based on today's performance, pt will continue to benefit from skilled PT in order to facilitate return to PLOF and improve QoL.  OBJECTIVE IMPAIRMENTS: decreased activity tolerance, decreased endurance, decreased ROM, decreased strength, increased edema, increased fascial restrictions, increased muscle spasms, impaired sensation, and pain.   ACTIVITY LIMITATIONS: carrying, lifting, transfers, bathing, dressing, reach over head, and hygiene/grooming  PARTICIPATION LIMITATIONS: meal prep, cleaning, laundry, community activity, and occupation  PERSONAL FACTORS: Age, Past/current experiences, Time since onset of injury/illness/exacerbation, and 3+ comorbidities: Hx of COPD, ACDF, recent humeral fixation are also affecting patient's functional outcome.   REHAB POTENTIAL: Good  CLINICAL DECISION MAKING: Evolving/moderate complexity  EVALUATION COMPLEXITY: Moderate   GOALS: Goals reviewed with patient? Yes  SHORT TERM GOALS: Target date: 02/07/2024  Pt will be independent with HEP to improve strength and decrease shoulder pain to improve pain-free function at  home and work. Baseline: Initial HEP provided  Goal status: INITIAL   LONG TERM GOALS: Target date: 0303/2026  Pt will decrease quick DASH score by at least 8% in order to demonstrate clinically significant reduction in disability with UE ADLs.  Baseline: 12/27/2023: TBD; 01/17/2024: 84.1 / 100 = 84.1 % Goal status: INITIAL  2.  Pt will decrease worst shoulder pain by at least 3 points on the NPRS in order to demonstrate clinically significant reduction in shoulder pain. Baseline: 12/27/2023: 10/10 NPS Goal status: INITIAL  3.  Pt will increase R shoulder flexion/abduction AROM to 140/180 respectively and pain free in order to demonstrate limb symmetry for OH lifting and ADLs.  Baseline: 12/27/2023:   Right Left  Shoulder    Flexion NT/80 deg  140  Extension    Abduction NT/40 deg 160   Goal status: INITIAL  4. Pt will increase R shoulder strength by at least 1/2 MMT grade in order to demonstrate improvement in strength and function         Baseline: 12/27/2023:  Shoulder   Flexion NT 4  Extension    Abduction NT 4  External rotation NT 4  Internal rotation NT 4   Goal status: INITIAL  5.  Pt will increase R grip strength to at least 90% limb symmetry in order to demonstrate significant improvements in hand function for cutting, grasping items.  Baseline: 01/17/2024: 55#/110#  Goal status: INITIAL  PLAN:  PT FREQUENCY: 1-2x/week  PT DURATION: 12 weeks  PLANNED INTERVENTIONS: [id number]- PT Re-evaluation, 97750- Physical Performance Testing, 97110-Therapeutic exercises, 97530- Therapeutic activity, 97112- Neuromuscular re-education, 97535- Self Care, [id number]- Manual therapy, Patient/Family education, Joint mobilization, Cryotherapy, and Moist heat  PLAN FOR NEXT SESSION: Review HEP, Initiate PROM/AAROM for R shoulder, Education on sling wear schedule per MD Protocol, Elbow and Wrist Mobility   Lonni Pall PT, DPT Physical Therapist- [place]  01/23/2024, 3:22 PM  "

## 2024-01-25 ENCOUNTER — Ambulatory Visit

## 2024-01-25 DIAGNOSIS — M79621 Pain in right upper arm: Secondary | ICD-10-CM | POA: Diagnosis not present

## 2024-01-25 DIAGNOSIS — M6281 Muscle weakness (generalized): Secondary | ICD-10-CM

## 2024-01-25 DIAGNOSIS — M25611 Stiffness of right shoulder, not elsewhere classified: Secondary | ICD-10-CM

## 2024-01-25 NOTE — Therapy (Signed)
 " OUTPATIENT PHYSICAL THERAPY SHOULDER/ELBOW TREATMENT  Patient Name: Destiny Wright MRN: [id number] DOB:[birth year], 59 y.o., adult Today's Date: 01/25/2024  END OF SESSION:  PT End of Session - 01/25/24 1600     Visit Number 7    Number of Visits 24    Date for Recertification  03/20/24    Authorization Type Auth 16 PT visits  12/9 -02/21/24  [id number]    Authorization - Visit Number 7    Authorization - Number of Visits 16    PT Start Time 1600    PT Stop Time 1640    PT Time Calculation (min) 40 min    Activity Tolerance Patient tolerated treatment well    Behavior During Therapy WFL for tasks assessed/performed            Past Medical History:  Diagnosis Date   Anxiety    Asthma    Basal cell carcinoma, arm    (bilateral forearm)   COPD (chronic obstructive pulmonary disease) (HCC)    Depression    Dyspepsia    Endometriosis    Gender dysphoria    GERD (gastroesophageal reflux disease)    Heart murmur 2018   echocardiogram   Hepatic steatosis    Hepatitis    History of Helicobacter pylori infection 2021   Hyperlipidemia    Hypertension    Hypothyroidism    Neuropathy of left foot    Osteoarthritis    PONV (postoperative nausea and vomiting)    PVC (premature ventricular contraction) 2008   echocardiogram   Stage 3b chronic kidney disease (CKD) (HCC)    Vitamin D deficiency    Past Surgical History:  Procedure Laterality Date   ANTERIOR CERVICAL DECOMP/DISCECTOMY FUSION  2014   BREAST BIOPSY Right 2012   COLONOSCOPY  2000   ESOPHAGOGASTRODUODENOSCOPY (EGD) WITH PROPOFOL  N/A 10/16/2019   Procedure: ESOPHAGOGASTRODUODENOSCOPY (EGD) WITH PROPOFOL ;  Surgeon: Unk Corinn Skiff, MD;  Location: [facility];  Service: Gastroenterology;  Laterality: N/A;   HAMMER TOE SURGERY Left 2015   KNEE ARTHROSCOPY Right 2004   arthritis   LAPAROSCOPY  2000   endometriosis   LYSIS OF ADHESION N/A 07/01/2022   Procedure: LYSIS OF ADHESION;  Surgeon: Verdon Keen, MD;  Location: [facility];  Service: Gynecology;  Laterality: N/A;   NASAL SEPTUM SURGERY  2009   ORIF SHOULDER FRACTURE Right 11/15/2023   Procedure: OPEN REDUCTION INTERNAL FIXATION (ORIF) SHOULDER FRACTURE;  Surgeon: Genelle Standing, MD;  Location: [facility];  Service: Orthopedics;  Laterality: Right;  RIGHT SHOULDER GREATER TUBEROSITY FRACTURE FIXATION   SIMPLE MASTECTOMY Bilateral 2017   with nipple graft   TONSILLECTOMY  2009   TOTAL ABDOMINAL HYSTERECTOMY W/ BILATERAL SALPINGOOPHORECTOMY  2006   TRACHELECTOMY N/A 07/01/2022   Procedure: ROBOTIC ASSISTED TRACHELECTOMY;  Surgeon: Verdon Keen, MD;  Location: [facility];  Service: Gynecology;  Laterality: N/A;   UPPER GI ENDOSCOPY  2003   Patient Active Problem List   Diagnosis Date Noted   Closed displaced fracture of greater tuberosity of right humerus 11/15/2023   Anterior dislocation of right shoulder 10/22/2023   Dyspepsia    Murmur, cardiac 05/14/2016   Premature ventricular contractions 05/14/2016   Endometriosis 08/31/2013    PCP: Epifanio Alm SQUIBB, MD  REFERRING PROVIDER:  Genelle Standing, MD     REFERRING DIAG:  S42.91XA (ICD-10-CM) - Closed fracture of right shoulder, initial encounter    RATIONALE FOR EVALUATION AND TREATMENT: Rehabilitation  THERAPY DIAG: Pain in right upper arm  Stiffness of right shoulder,  not elsewhere classified  Muscle weakness (generalized)  ONSET DATE: s/p Right greater tuberosity fracture ORIF 11/15/2023   FOLLOW-UP APPT SCHEDULED WITH REFERRING PROVIDER: Yes    SUBJECTIVE:                                                                                                                                                                                         SUBJECTIVE STATEMENT:    Patient reports to OPPT with R shoulder pain   PERTINENT HISTORY:   Destiny Wright is 59 y.o female presenting with R shoulder following Right greater tuberosity fracture ORIF 11/15/2023.  Patient reports that there is increased swellign in the upper R arm and a sharp, stinging pain. He was told by Dr. Leonce reported increased fluid in the upper arm. There is still some numbness in the anterior and lateral upper arm below the incision. Patient has upper extremity immobilized in a sling since Oct 4th. He has been working on grip strength and active elbow extension/flexion.   Hand Dominance: Right   Denies bilateral numbness/tingling, chills, fevers, nausea.   Imaging (Per Chart Review):  EXAM: 1 VIEW XRAY OF THE RIGHT SHOULDER 11/30/2023 03:18:13 PM   COMPARISON: 15 days ago status post surgical internal fixation of proximal right humeral fracture no dislocation is noted.   CLINICAL HISTORY: post op   FINDINGS:   BONES AND JOINTS: Status post surgical internal fixation of proximal right humeral fracture. No dislocation is noted. The glenohumeral joint is normally aligned. No acute fracture. The [facility] joint is unremarkable in appearance.   SOFT TISSUES: No abnormal calcifications. Visualized lung is unremarkable.   IMPRESSION: 1. Status post surgical internal fixation of proximal right humeral fracture. 2. No dislocation.   Electronically signed by: Lynwood Seip MD 11/30/2023 04:32 PM EST RP Workstation: [id number]  PAIN:  Pain Intensity: Present: 8/10, Best: 8/10, Worst: 10/10 Pain location: R Shoulder Pain Quality: intermittent  Radiating: No  Numbness/Tingling: Yes Aggravating factors: Random Onset Relieving factors: Desensitizing, rubbing the shoulder   PRECAUTIONS: Shoulder PROM, AROM when cleared   Per [facility] UE ORIF Protocol: (Discontinued - 01/02/2024)  Weeks 4-8:  Begin FULL ROM   PROMAAROMAROM   Sling as needed for comfort only   May begin isometric strengthening   Week 6 - Light resistance strengthening progression may begin    WEIGHT BEARING RESTRICTIONS: Yes    FALLS: Has patient fallen in last 6 months? Yes. Number of falls  1  Living Environment Lives with: lives alone Lives in: House/apartment Stairs: Yes: Internal: 10-13 steps; on right going up Has following equipment at home: None  Prior level of function: Independent  Occupational demands:Total Disability   Hobbies: Biking (E-bike)   Patient Goals: I would like to improve ROM in the my R arm   OBJECTIVE:   Patient Surveys  QuickDASH:  Cognition Patient is oriented to person, place, and time.  Recent memory is intact.  Remote memory is intact.  Attention span and concentration are intact.  Expressive speech is intact.  Patient's fund of knowledge is within normal limits for educational level.    Gross Musculoskeletal Assessment Tremor: None Bulk: Normal Tone: Normal  Gait WNL  Posture Seated: R arm internally rotated and with elbow contracture out of sling   Cervical Screen AROM: WFL and painless with overpressure in all planes  AROM AROM (Normal range in degrees) AROM/PROM   Cervical  Flexion (50)   Extension (80)   Right lateral flexion (45)   Left lateral flexion (45)   Right rotation (85)   Left rotation (85)    Right Left  Shoulder    Flexion NT/80 deg  140  Extension    Abduction NT/40 deg 160  External Rotation    Internal Rotation  WNL  Hands Behind Head  WNL  Hands Behind Back        Elbow  WNL  Flexion  WNL  Extension    Pronation    Supination    (* = pain; Blank rows = not tested)  UE MMT: MMT (out of 5) Right Left   Cervical (isometric)  Flexion WNL  Extension WNL  Lateral Flexion WNL WNL  Rotation WNL WNL      Shoulder   Flexion NT 4  Extension    Abduction NT 4  External rotation NT 4  Internal rotation NT 4  Horizontal abduction    Horizontal adduction    Lower Trapezius    Rhomboids    `    Elbow  Flexion NT 5  Extension NT 5  Pronation  5  Supination  5      (* = pain; Blank rows = not tested)  Sensation Decreased Sensation along lateral aspect of upper Arm.    Reflexes Deferred   Palpation Location LEFT  RIGHT           Subocciptials    Cervical paraspinals    Upper Trapezius  1  Levator Scapulae  1  Rhomboid Major/Minor    Sternoclavicular joint    Acromioclavicular joint    Coracoid process    Long head of biceps    Supraspinatus    Infraspinatus    Subscapularis    Teres Minor    Teres Major    Pectoralis Major    Pectoralis Minor    Anterior Deltoid  1  Lateral Deltoid  1  Posterior Deltoid  1  Latissimus Dorsi    Sternocleidomastoid    (Blank rows = not tested) Graded on 0-4 scale (0 = no pain, 1 = pain, 2 = pain with wincing/grimacing/flinching, 3 = pain with withdrawal, 4 = unwilling to allow palpation), (Blank rows = not tested)   Passive Accessory Intervertebral Motion Pt denies reproduction of shoulder pain with CPA C2-T7 and UPA bilaterally C2-T7. Generally, hypomobile throughout  Accessory Motions/Glides Deferred  Muscle Length Testing Deferred  SPECIAL TESTS Deferred  TODAY'S TREATMENT  DATE: 01/25/2024  Per Dr. Genelle, MD (12/28/2023): At this time he will be allowed to do active range of motion as tolerated. I would like have no lifting greater than 15 pounds. I will plan to see him in 6 weeks  for reassessment  Subjective:.Patient report consistent 8/10 pain in the R shoulder. Patient reports that the frequency of shoulder nerve pain has decreased but the intensity remains the same. Patient unable to do the HEP at 100% due to upper respiratory infection. No further questions or concerns    He reports that last night there was increased pain and he had to take a hydrocodone  in order to decrease the pain and sleep. Patient reports that he has been adhering to the HEP.  No questions or concerns.   Therapeutic Exercise:  Hooklying Shoulder Horizontal Adduction   R 3 x 10 -2# DB   Hooklying Shoulder Flexion  R 3 x 10 - 2# DB  Hooklying Shoulder D2 Flexion   R 3 x 10 - 2# DB in last two  sets  Hooklying R Shoulder ER   4 x 10 - 3# DB   Passive R Shoulder ER @ 90 deg abd   30s/bout x 3 in order to improve ROM/muscle tension/tissue extensibility     Therapeutic Activity (with intent to improve upward/outward reaching and carrying):  UBE - 5 min - 2.5 min Fwd, 2.5 min Retro - Level 8-5 for UE  strength and muscular endurance; PT manually adjusted resistance throughout to patient's tolerance.   - Patient with severe nerve pain    AAROM - Seated Pulley    1 x 20 - Shoulder Flexion - able to achieve 120 deg   1 x 20 - Shoulder Abduction  AAROM - Reaching OH with Dowel - BUE   2 x 12  AROM - Shoulder Flexion Wall Slide  2 x 10 - RUE    PATIENT EDUCATION:  Education details: Exercise Technique  Person educated: Patient Education method: Medical Illustrator Education comprehension: verbalized understanding and returned demonstration   HOME EXERCISE PROGRAM:  Access Code: [email or web address] URL: [email or web address] Date: 01/02/2024 Prepared by: Lonni Pall  Exercises - Seated Biceps Curl  - 1 x daily - 7 x weekly - 3 sets - 10-20 reps - Seated Scapular Retraction  - 1 x daily - 7 x weekly - 3 sets - 10-20 reps - Seated Gripping Towel  - 1 x daily - 7 x weekly - 3 sets - 10-12 reps - Circular Shoulder Pendulum with Table Support  - 1 x daily - 7 x weekly - 3 sets - 20 reps - Supine Shoulder Flexion with Dowel  - 2 x daily - 7 x weekly - 3 sets - 10-12 reps - Supine Shoulder External Rotation with Dowel  - 2 x daily - 7 x weekly - 3 sets - 10-12 reps - Supine Shoulder Horizontal Abduction Adduction AAROM with Dowel  - 2 x daily - 7 x weekly - 3 sets - 10-12 reps - Seated Shoulder Flexion Towel Slide at Table Top  - 2 x daily - 7 x weekly - 3 sets - 10-12 reps - Seated Shoulder Abduction Towel Slide at Table Top  - 7 x weekly - 3 sets - 10- reps  ASSESSMENT:  CLINICAL IMPRESSION: Destiny Wright returns to OPPT for management of s/p ORIF on 11/15/2023.  Patient is 9 weeks s/p surgery. Session with main focus on AAROM exercises with main focus on improving OH reach. PROM consistently reaching 120 deg in shoulder abduction and flexion. Patient can consistently reach 90 deg AROM in shoulder flex/abd with minimal pain. Patient still presenting with moderate limitations in R shoulder ER, encouraged HEP focus to wean from PROM and progress to AROM. Patient still  presents with severe deficits in RUE strength, ROM and pain limiting his full participation in recreational and ADL tasks such as OH reaching. PT plan to progress AAROM to AROM to prevent scar adhesion or capsular adhesions. Based on today's performance, pt will continue to benefit from skilled PT in order to facilitate return to PLOF and improve QoL.  OBJECTIVE IMPAIRMENTS: decreased activity tolerance, decreased endurance, decreased ROM, decreased strength, increased edema, increased fascial restrictions, increased muscle spasms, impaired sensation, and pain.   ACTIVITY LIMITATIONS: carrying, lifting, transfers, bathing, dressing, reach over head, and hygiene/grooming  PARTICIPATION LIMITATIONS: meal prep, cleaning, laundry, community activity, and occupation  PERSONAL FACTORS: Age, Past/current experiences, Time since onset of injury/illness/exacerbation, and 3+ comorbidities: Hx of COPD, ACDF, recent humeral fixation are also affecting patient's functional outcome.   REHAB POTENTIAL: Good  CLINICAL DECISION MAKING: Evolving/moderate complexity  EVALUATION COMPLEXITY: Moderate   GOALS: Goals reviewed with patient? Yes  SHORT TERM GOALS: Target date: 02/07/2024  Pt will be independent with HEP to improve strength and decrease shoulder pain to improve pain-free function at home and work. Baseline: Initial HEP provided  Goal status: INITIAL   LONG TERM GOALS: Target date: 0303/2026  Pt will decrease quick DASH score by at least 8% in order to demonstrate clinically significant  reduction in disability with UE ADLs.  Baseline: 12/27/2023: TBD; 01/17/2024: 84.1 / 100 = 84.1 % Goal status: INITIAL  2.  Pt will decrease worst shoulder pain by at least 3 points on the NPRS in order to demonstrate clinically significant reduction in shoulder pain. Baseline: 12/27/2023: 10/10 NPS Goal status: INITIAL  3.  Pt will increase R shoulder flexion/abduction AROM to 140/180 respectively and pain free in order to demonstrate limb symmetry for OH lifting and ADLs.  Baseline: 12/27/2023:   Right Left  Shoulder    Flexion NT/80 deg  140  Extension    Abduction NT/40 deg 160   Goal status: INITIAL  4. Pt will increase R shoulder strength by at least 1/2 MMT grade in order to demonstrate improvement in strength and function         Baseline: 12/27/2023:  Shoulder   Flexion NT 4  Extension    Abduction NT 4  External rotation NT 4  Internal rotation NT 4   Goal status: INITIAL  5.  Pt will increase R grip strength to at least 90% limb symmetry in order to demonstrate significant improvements in hand function for cutting, grasping items.  Baseline: 01/17/2024: 55#/110#  Goal status: INITIAL  PLAN:  PT FREQUENCY: 1-2x/week  PT DURATION: 12 weeks  PLANNED INTERVENTIONS: [id number]- PT Re-evaluation, 97750- Physical Performance Testing, 97110-Therapeutic exercises, 97530- Therapeutic activity, 97112- Neuromuscular re-education, 97535- Self Care, [id number]- Manual therapy, Patient/Family education, Joint mobilization, Cryotherapy, and Moist heat  PLAN FOR NEXT SESSION: Review HEP, Initiate PROM/AAROM for R shoulder, Education on sling wear schedule per MD Protocol, Elbow and Wrist Mobility   Lonni Pall PT, DPT Physical Therapist- [place]  01/25/2024, 5:29 PM  "

## 2024-01-26 ENCOUNTER — Telehealth (HOSPITAL_BASED_OUTPATIENT_CLINIC_OR_DEPARTMENT_OTHER): Payer: Self-pay | Admitting: Orthopaedic Surgery

## 2024-01-26 NOTE — Telephone Encounter (Signed)
 Patient has questions about the surgery and he his having hand pain. He would like to know if DR Joelene in [place] could see him in [place] or could he just see someone about his hand or should he just see Dr B about his hand.

## 2024-01-27 NOTE — Telephone Encounter (Signed)
 Returned call to patient who wanted to confirm he was allowed to see someone else for a different problem and establish care closer to his home. I informed him that was not an issue, and he knows we will continue to see him for his shoulder. Referral already placed from PCP to ortho in [place]

## 2024-01-30 ENCOUNTER — Ambulatory Visit

## 2024-01-30 DIAGNOSIS — M25611 Stiffness of right shoulder, not elsewhere classified: Secondary | ICD-10-CM

## 2024-01-30 DIAGNOSIS — M79621 Pain in right upper arm: Secondary | ICD-10-CM

## 2024-01-30 DIAGNOSIS — M6281 Muscle weakness (generalized): Secondary | ICD-10-CM

## 2024-01-30 NOTE — Therapy (Signed)
 " OUTPATIENT PHYSICAL THERAPY SHOULDER/ELBOW TREATMENT  Patient Name: Destiny Wright MRN: [id number] DOB:1965/11/29, 59 y.o., adult Today's Date: 01/30/2024  END OF SESSION:  PT End of Session - 01/30/24 1352     Visit Number 8    Number of Visits 24    Date for Recertification  03/20/24    Authorization Type Auth 16 PT visits  12/9 -02/21/24  [id number]    Authorization - Visit Number 8    Authorization - Number of Visits 16    Progress Note Due on Visit 10    PT Start Time 1345    PT Stop Time 1425    PT Time Calculation (min) 40 min    Activity Tolerance Patient tolerated treatment well    Behavior During Therapy WFL for tasks assessed/performed            Past Medical History:  Diagnosis Date   Anxiety    Asthma    Basal cell carcinoma, arm    (bilateral forearm)   COPD (chronic obstructive pulmonary disease) (HCC)    Depression    Dyspepsia    Endometriosis    Gender dysphoria    GERD (gastroesophageal reflux disease)    Heart murmur 2018   echocardiogram   Hepatic steatosis    Hepatitis    History of Helicobacter pylori infection 2021   Hyperlipidemia    Hypertension    Hypothyroidism    Neuropathy of left foot    Osteoarthritis    PONV (postoperative nausea and vomiting)    PVC (premature ventricular contraction) 2008   echocardiogram   Stage 3b chronic kidney disease (CKD) (HCC)    Vitamin D deficiency    Past Surgical History:  Procedure Laterality Date   ANTERIOR CERVICAL DECOMP/DISCECTOMY FUSION  2014   BREAST BIOPSY Right 2012   COLONOSCOPY  2000   ESOPHAGOGASTRODUODENOSCOPY (EGD) WITH PROPOFOL  N/A 10/16/2019   Procedure: ESOPHAGOGASTRODUODENOSCOPY (EGD) WITH PROPOFOL ;  Surgeon: Unk Corinn Skiff, MD;  Location: [facility];  Service: Gastroenterology;  Laterality: N/A;   HAMMER TOE SURGERY Left 2015   KNEE ARTHROSCOPY Right 2004   arthritis   LAPAROSCOPY  2000   endometriosis   LYSIS OF ADHESION N/A 07/01/2022   Procedure: LYSIS  OF ADHESION;  Surgeon: Verdon Keen, MD;  Location: [facility];  Service: Gynecology;  Laterality: N/A;   NASAL SEPTUM SURGERY  2009   ORIF SHOULDER FRACTURE Right 11/15/2023   Procedure: OPEN REDUCTION INTERNAL FIXATION (ORIF) SHOULDER FRACTURE;  Surgeon: Genelle Standing, MD;  Location: [facility];  Service: Orthopedics;  Laterality: Right;  RIGHT SHOULDER GREATER TUBEROSITY FRACTURE FIXATION   SIMPLE MASTECTOMY Bilateral 2017   with nipple graft   TONSILLECTOMY  2009   TOTAL ABDOMINAL HYSTERECTOMY W/ BILATERAL SALPINGOOPHORECTOMY  2006   TRACHELECTOMY N/A 07/01/2022   Procedure: ROBOTIC ASSISTED TRACHELECTOMY;  Surgeon: Verdon Keen, MD;  Location: [facility];  Service: Gynecology;  Laterality: N/A;   UPPER GI ENDOSCOPY  2003   Patient Active Problem List   Diagnosis Date Noted   Closed displaced fracture of greater tuberosity of right humerus 11/15/2023   Anterior dislocation of right shoulder 10/22/2023   Dyspepsia    Murmur, cardiac 05/14/2016   Premature ventricular contractions 05/14/2016   Endometriosis 08/31/2013    PCP: Epifanio Alm SQUIBB, MD  REFERRING PROVIDER:  Genelle Standing, MD     REFERRING DIAG:  S42.91XA (ICD-10-CM) - Closed fracture of right shoulder, initial encounter    RATIONALE FOR EVALUATION AND TREATMENT: Rehabilitation  THERAPY DIAG: Pain  in right upper arm  Stiffness of right shoulder, not elsewhere classified  Muscle weakness (generalized)  ONSET DATE: s/p Right greater tuberosity fracture ORIF 11/15/2023   FOLLOW-UP APPT SCHEDULED WITH REFERRING PROVIDER: Yes    SUBJECTIVE:                                                                                                                                                                                         SUBJECTIVE STATEMENT:    Patient reports to OPPT with R shoulder pain   PERTINENT HISTORY:   Destiny Wright is 59 y.o female presenting with R shoulder following Right greater  tuberosity fracture ORIF 11/15/2023. Patient reports that there is increased swellign in the upper R arm and a sharp, stinging pain. He was told by Dr. Leonce reported increased fluid in the upper arm. There is still some numbness in the anterior and lateral upper arm below the incision. Patient has upper extremity immobilized in a sling since Oct 4th. He has been working on grip strength and active elbow extension/flexion.   Hand Dominance: Right   Denies bilateral numbness/tingling, chills, fevers, nausea.   Imaging (Per Chart Review):  EXAM: 1 VIEW XRAY OF THE RIGHT SHOULDER 11/30/2023 03:18:13 PM   COMPARISON: 15 days ago status post surgical internal fixation of proximal right humeral fracture no dislocation is noted.   CLINICAL HISTORY: post op   FINDINGS:   BONES AND JOINTS: Status post surgical internal fixation of proximal right humeral fracture. No dislocation is noted. The glenohumeral joint is normally aligned. No acute fracture. The [facility] joint is unremarkable in appearance.   SOFT TISSUES: No abnormal calcifications. Visualized lung is unremarkable.   IMPRESSION: 1. Status post surgical internal fixation of proximal right humeral fracture. 2. No dislocation.   Electronically signed by: Lynwood Seip MD 11/30/2023 04:32 PM EST RP Workstation: [id number]  PAIN:  Pain Intensity: Present: 8/10, Best: 8/10, Worst: 10/10 Pain location: R Shoulder Pain Quality: intermittent  Radiating: No  Numbness/Tingling: Yes Aggravating factors: Random Onset Relieving factors: Desensitizing, rubbing the shoulder   PRECAUTIONS: Shoulder PROM, AROM when cleared   Per [facility] UE ORIF Protocol: (Discontinued - 01/02/2024)  Weeks 4-8:  Begin FULL ROM   PROMAAROMAROM   Sling as needed for comfort only   May begin isometric strengthening   Week 6 - Light resistance strengthening progression may begin    WEIGHT BEARING RESTRICTIONS: Yes    FALLS: Has patient fallen in  last 6 months? Yes. Number of falls 1  Living Environment Lives with: lives alone Lives in: House/apartment Stairs: Yes: Internal: 10-13 steps; on right going up Has following equipment at  home: None  Prior level of function: Independent  Occupational demands:Total Disability   Hobbies: Biking (E-bike)   Patient Goals: I would like to improve ROM in the my R arm   OBJECTIVE:   Patient Surveys  QuickDASH:  Cognition Patient is oriented to person, place, and time.  Recent memory is intact.  Remote memory is intact.  Attention span and concentration are intact.  Expressive speech is intact.  Patient's fund of knowledge is within normal limits for educational level.    Gross Musculoskeletal Assessment Tremor: None Bulk: Normal Tone: Normal  Gait WNL  Posture Seated: R arm internally rotated and with elbow contracture out of sling   Cervical Screen AROM: WFL and painless with overpressure in all planes  AROM AROM (Normal range in degrees) AROM/PROM   Cervical  Flexion (50)   Extension (80)   Right lateral flexion (45)   Left lateral flexion (45)   Right rotation (85)   Left rotation (85)    Right Left  Shoulder    Flexion NT/80 deg  140  Extension    Abduction NT/40 deg 160  External Rotation    Internal Rotation  WNL  Hands Behind Head  WNL  Hands Behind Back        Elbow  WNL  Flexion  WNL  Extension    Pronation    Supination    (* = pain; Blank rows = not tested)  UE MMT: MMT (out of 5) Right Left   Cervical (isometric)  Flexion WNL  Extension WNL  Lateral Flexion WNL WNL  Rotation WNL WNL      Shoulder   Flexion NT 4  Extension    Abduction NT 4  External rotation NT 4  Internal rotation NT 4  Horizontal abduction    Horizontal adduction    Lower Trapezius    Rhomboids    `    Elbow  Flexion NT 5  Extension NT 5  Pronation  5  Supination  5      (* = pain; Blank rows = not tested)  Sensation Decreased Sensation along  lateral aspect of upper Arm.   Reflexes Deferred   Palpation Location LEFT  RIGHT           Subocciptials    Cervical paraspinals    Upper Trapezius  1  Levator Scapulae  1  Rhomboid Major/Minor    Sternoclavicular joint    Acromioclavicular joint    Coracoid process    Long head of biceps    Supraspinatus    Infraspinatus    Subscapularis    Teres Minor    Teres Major    Pectoralis Major    Pectoralis Minor    Anterior Deltoid  1  Lateral Deltoid  1  Posterior Deltoid  1  Latissimus Dorsi    Sternocleidomastoid    (Blank rows = not tested) Graded on 0-4 scale (0 = no pain, 1 = pain, 2 = pain with wincing/grimacing/flinching, 3 = pain with withdrawal, 4 = unwilling to allow palpation), (Blank rows = not tested)   Passive Accessory Intervertebral Motion Pt denies reproduction of shoulder pain with CPA C2-T7 and UPA bilaterally C2-T7. Generally, hypomobile throughout  Accessory Motions/Glides Deferred  Muscle Length Testing Deferred  SPECIAL TESTS Deferred  TODAY'S TREATMENT  DATE: 01/30/2024  Per Dr. Genelle, MD (12/28/2023): At this time he will be allowed to do active range of motion as tolerated. I would like have no lifting greater than 15 pounds.  I will plan to see him in 6 weeks for reassessment  Subjective:.Patient report consistent 8/10 pain in the R shoulder. Patient reports that his RUE feels shorter and when he reaches out with both arms the R looks shorter. Concerned that his R arm is shorter than the L and it may affect his progress. No further questions or concerns    Therapeutic Exercise:  Standing Bent over row with DB   3 x 10 - 5# BUE   Lat Pull down for PROM into OH reach and scapular strengthening   3 x 10 - 15#   Hooklying Shoulder D2 Flexion   RUE:  3 x 10 - 2# DB in last two sets  Passive R Shoulder ER @ 90 deg abd   30s/bout x 3 in order to improve ROM/muscle tension/tissue extensibility  Passive R Shoulder Pectoral Stretch    30s/bout x 2 in order to improve ROM/muscle tension/tissue extensibility  Time Spent reviewing modifications to HEP and removing PROM exercises and replacing with AAROM/AROM exercises.   Therapeutic Activity (with intent to improve upward/outward reaching and carrying):  UBE - 5 min - 2.5 min Fwd, 2.5 min Retro - Level 8-5 for UE  strength and muscular endurance; PT manually adjusted resistance throughout to patient's tolerance.   - Patient with severe nerve pain    AAROM - Shoulder Flexion with Dowel   2 x 20 repss  AAROM - Shoulder Abduction with Dowel   1 x 20 reps  AAROM - Shoulder Towel Slide   2 x 10 - Abduction to 90 deg  AROM - Shoulder Flexion Wall Slide  2 x 10 - RUE    PATIENT EDUCATION:  Education details: Exercise Technique  Person educated: Patient Education method: Medical Illustrator Education comprehension: verbalized understanding and returned demonstration   HOME EXERCISE PROGRAM:  Access Code: [email or web address] URL: [email or web address] Date: 01/02/2024 Prepared by: Lonni Pall  Exercises - Seated Biceps Curl  - 1 x daily - 7 x weekly - 3 sets - 10-20 reps - Seated Scapular Retraction  - 1 x daily - 7 x weekly - 3 sets - 10-20 reps - Seated Gripping Towel  - 1 x daily - 7 x weekly - 3 sets - 10-12 reps - Circular Shoulder Pendulum with Table Support  - 1 x daily - 7 x weekly - 3 sets - 20 reps - Supine Shoulder Flexion with Dowel  - 2 x daily - 7 x weekly - 3 sets - 10-12 reps - Supine Shoulder External Rotation with Dowel  - 2 x daily - 7 x weekly - 3 sets - 10-12 reps - Supine Shoulder Horizontal Abduction Adduction AAROM with Dowel  - 2 x daily - 7 x weekly - 3 sets - 10-12 reps - Seated Shoulder Flexion Towel Slide at Table Top  - 2 x daily - 7 x weekly - 3 sets - 10-12 reps - Seated Shoulder Abduction Towel Slide at Table Top  - 7 x weekly - 3 sets - 10- reps  ASSESSMENT:  CLINICAL IMPRESSION: Destiny Wright returns to OPPT for  management of s/p ORIF on 11/15/2023. Patient is 10 weeks s/p surgery. Patient tolerated all activities without exacerbation of UE pain. Use of Lat pulldown for increased OH reaching and scapular strengthening. Patient demonstrating improvements towards AROM from [facility] with RUE. He can successfully reach to 100 deg AROM with D2 in supine. Patient's nerve pain in the lateral shoulder without improvements with PT; PT advised to speak to physician  regarding interventions for never pain. Patient still presenting with severe limitations in RUE shoulder strength and ROM limiting his full participation in recreational and ADL tasks involving OH reaching. Based on today's performance, pt will continue to benefit from skilled PT in order to facilitate return to PLOF and improve QoL.  OBJECTIVE IMPAIRMENTS: decreased activity tolerance, decreased endurance, decreased ROM, decreased strength, increased edema, increased fascial restrictions, increased muscle spasms, impaired sensation, and pain.   ACTIVITY LIMITATIONS: carrying, lifting, transfers, bathing, dressing, reach over head, and hygiene/grooming  PARTICIPATION LIMITATIONS: meal prep, cleaning, laundry, community activity, and occupation  PERSONAL FACTORS: Age, Past/current experiences, Time since onset of injury/illness/exacerbation, and 3+ comorbidities: Hx of COPD, ACDF, recent humeral fixation are also affecting patient's functional outcome.   REHAB POTENTIAL: Good  CLINICAL DECISION MAKING: Evolving/moderate complexity  EVALUATION COMPLEXITY: Moderate   GOALS: Goals reviewed with patient? Yes  SHORT TERM GOALS: Target date: 02/07/2024  Pt will be independent with HEP to improve strength and decrease shoulder pain to improve pain-free function at home and work. Baseline: Initial HEP provided  Goal status: INITIAL   LONG TERM GOALS: Target date: 0303/2026  Pt will decrease quick DASH score by at least 8% in order to demonstrate clinically  significant reduction in disability with UE ADLs.  Baseline: 12/27/2023: TBD; 01/17/2024: 84.1 / 100 = 84.1 % Goal status: INITIAL  2.  Pt will decrease worst shoulder pain by at least 3 points on the NPRS in order to demonstrate clinically significant reduction in shoulder pain. Baseline: 12/27/2023: 10/10 NPS Goal status: INITIAL  3.  Pt will increase R shoulder flexion/abduction AROM to 140/180 respectively and pain free in order to demonstrate limb symmetry for OH lifting and ADLs.  Baseline: 12/27/2023:   Right Left  Shoulder    Flexion NT/80 deg  140  Extension    Abduction NT/40 deg 160   Goal status: INITIAL  4. Pt will increase R shoulder strength by at least 1/2 MMT grade in order to demonstrate improvement in strength and function         Baseline: 12/27/2023:  Shoulder   Flexion NT 4  Extension    Abduction NT 4  External rotation NT 4  Internal rotation NT 4   Goal status: INITIAL  5.  Pt will increase R grip strength to at least 90% limb symmetry in order to demonstrate significant improvements in hand function for cutting, grasping items.  Baseline: 01/17/2024: 55#/110#  Goal status: INITIAL  PLAN:  PT FREQUENCY: 1-2x/week  PT DURATION: 12 weeks  PLANNED INTERVENTIONS: [id number]- PT Re-evaluation, 97750- Physical Performance Testing, 97110-Therapeutic exercises, 97530- Therapeutic activity, 97112- Neuromuscular re-education, 97535- Self Care, [id number]- Manual therapy, Patient/Family education, Joint mobilization, Cryotherapy, and Moist heat  PLAN FOR NEXT SESSION: Review HEP, Initiate PROM/AAROM for R shoulder, Education on sling wear schedule per MD Protocol, Elbow and Wrist Mobility   Lonni Pall PT, DPT Physical Therapist- [place]  01/30/2024, 1:53 PM  "

## 2024-02-01 ENCOUNTER — Ambulatory Visit

## 2024-02-01 DIAGNOSIS — M79621 Pain in right upper arm: Secondary | ICD-10-CM | POA: Diagnosis not present

## 2024-02-01 DIAGNOSIS — M6281 Muscle weakness (generalized): Secondary | ICD-10-CM

## 2024-02-01 DIAGNOSIS — M25611 Stiffness of right shoulder, not elsewhere classified: Secondary | ICD-10-CM

## 2024-02-01 NOTE — Therapy (Signed)
 " OUTPATIENT PHYSICAL THERAPY SHOULDER/ELBOW TREATMENT  Patient Name: Destiny Wright MRN: [id number] DOB:01/06/1966, 59 y.o., adult Today's Date: 02/01/2024  END OF SESSION:  PT End of Session - 02/01/24 1257     Visit Number 9    Number of Visits 24    Date for Recertification  03/20/24    Authorization Type Auth 16 PT visits  12/9 -02/21/24  [id number]    Authorization - Number of Visits 16    Progress Note Due on Visit 10    PT Start Time 1300    PT Stop Time 1340    PT Time Calculation (min) 40 min    Activity Tolerance Patient tolerated treatment well    Behavior During Therapy WFL for tasks assessed/performed            Past Medical History:  Diagnosis Date   Anxiety    Asthma    Basal cell carcinoma, arm    (bilateral forearm)   COPD (chronic obstructive pulmonary disease) (HCC)    Depression    Dyspepsia    Endometriosis    Gender dysphoria    GERD (gastroesophageal reflux disease)    Heart murmur 2018   echocardiogram   Hepatic steatosis    Hepatitis    History of Helicobacter pylori infection 2021   Hyperlipidemia    Hypertension    Hypothyroidism    Neuropathy of left foot    Osteoarthritis    PONV (postoperative nausea and vomiting)    PVC (premature ventricular contraction) 2008   echocardiogram   Stage 3b chronic kidney disease (CKD) (HCC)    Vitamin D deficiency    Past Surgical History:  Procedure Laterality Date   ANTERIOR CERVICAL DECOMP/DISCECTOMY FUSION  2014   BREAST BIOPSY Right 2012   COLONOSCOPY  2000   ESOPHAGOGASTRODUODENOSCOPY (EGD) WITH PROPOFOL  N/A 10/16/2019   Procedure: ESOPHAGOGASTRODUODENOSCOPY (EGD) WITH PROPOFOL ;  Surgeon: Unk Corinn Skiff, MD;  Location: [facility];  Service: Gastroenterology;  Laterality: N/A;   HAMMER TOE SURGERY Left 2015   KNEE ARTHROSCOPY Right 2004   arthritis   LAPAROSCOPY  2000   endometriosis   LYSIS OF ADHESION N/A 07/01/2022   Procedure: LYSIS OF ADHESION;  Surgeon: Verdon Keen, MD;  Location: [facility];  Service: Gynecology;  Laterality: N/A;   NASAL SEPTUM SURGERY  2009   ORIF SHOULDER FRACTURE Right 11/15/2023   Procedure: OPEN REDUCTION INTERNAL FIXATION (ORIF) SHOULDER FRACTURE;  Surgeon: Genelle Standing, MD;  Location: [facility];  Service: Orthopedics;  Laterality: Right;  RIGHT SHOULDER GREATER TUBEROSITY FRACTURE FIXATION   SIMPLE MASTECTOMY Bilateral 2017   with nipple graft   TONSILLECTOMY  2009   TOTAL ABDOMINAL HYSTERECTOMY W/ BILATERAL SALPINGOOPHORECTOMY  2006   TRACHELECTOMY N/A 07/01/2022   Procedure: ROBOTIC ASSISTED TRACHELECTOMY;  Surgeon: Verdon Keen, MD;  Location: [facility];  Service: Gynecology;  Laterality: N/A;   UPPER GI ENDOSCOPY  2003   Patient Active Problem List   Diagnosis Date Noted   Closed displaced fracture of greater tuberosity of right humerus 11/15/2023   Anterior dislocation of right shoulder 10/22/2023   Dyspepsia    Murmur, cardiac 05/14/2016   Premature ventricular contractions 05/14/2016   Endometriosis 08/31/2013    PCP: Epifanio Alm SQUIBB, MD  REFERRING PROVIDER:  Genelle Standing, MD     REFERRING DIAG:  S42.91XA (ICD-10-CM) - Closed fracture of right shoulder, initial encounter    RATIONALE FOR EVALUATION AND TREATMENT: Rehabilitation  THERAPY DIAG: Pain in right upper arm  Stiffness of right  shoulder, not elsewhere classified  Muscle weakness (generalized)  ONSET DATE: s/p Right greater tuberosity fracture ORIF 11/15/2023   FOLLOW-UP APPT SCHEDULED WITH REFERRING PROVIDER: Yes    SUBJECTIVE:                                                                                                                                                                                         SUBJECTIVE STATEMENT:    Patient reports to OPPT with R shoulder pain   PERTINENT HISTORY:   Destiny Wright is 59 y.o female presenting with R shoulder following Right greater tuberosity fracture ORIF 11/15/2023.  Patient reports that there is increased swellign in the upper R arm and a sharp, stinging pain. He was told by Dr. Leonce reported increased fluid in the upper arm. There is still some numbness in the anterior and lateral upper arm below the incision. Patient has upper extremity immobilized in a sling since Oct 4th. He has been working on grip strength and active elbow extension/flexion.   Hand Dominance: Right   Denies bilateral numbness/tingling, chills, fevers, nausea.   Imaging (Per Chart Review):  EXAM: 1 VIEW XRAY OF THE RIGHT SHOULDER 11/30/2023 03:18:13 PM   COMPARISON: 15 days ago status post surgical internal fixation of proximal right humeral fracture no dislocation is noted.   CLINICAL HISTORY: post op   FINDINGS:   BONES AND JOINTS: Status post surgical internal fixation of proximal right humeral fracture. No dislocation is noted. The glenohumeral joint is normally aligned. No acute fracture. The [facility] joint is unremarkable in appearance.   SOFT TISSUES: No abnormal calcifications. Visualized lung is unremarkable.   IMPRESSION: 1. Status post surgical internal fixation of proximal right humeral fracture. 2. No dislocation.   Electronically signed by: Lynwood Seip MD 11/30/2023 04:32 PM EST RP Workstation: [id number]  PAIN:  Pain Intensity: Present: 8/10, Best: 8/10, Worst: 10/10 Pain location: R Shoulder Pain Quality: intermittent  Radiating: No  Numbness/Tingling: Yes Aggravating factors: Random Onset Relieving factors: Desensitizing, rubbing the shoulder   PRECAUTIONS: Shoulder PROM, AROM when cleared   Per [facility] UE ORIF Protocol: (Discontinued - 01/02/2024)  Weeks 4-8:  Begin FULL ROM   PROMAAROMAROM   Sling as needed for comfort only   May begin isometric strengthening   Week 6 - Light resistance strengthening progression may begin    WEIGHT BEARING RESTRICTIONS: Yes    FALLS: Has patient fallen in last 6 months? Yes. Number of falls  1  Living Environment Lives with: lives alone Lives in: House/apartment Stairs: Yes: Internal: 10-13 steps; on right going up Has following equipment at home: None  Prior level of function: Independent  Occupational demands:Total Disability   Hobbies: Biking (E-bike)   Patient Goals: I would like to improve ROM in the my R arm   OBJECTIVE:   Patient Surveys  QuickDASH:  Cognition Patient is oriented to person, place, and time.  Recent memory is intact.  Remote memory is intact.  Attention span and concentration are intact.  Expressive speech is intact.  Patient's fund of knowledge is within normal limits for educational level.    Gross Musculoskeletal Assessment Tremor: None Bulk: Normal Tone: Normal  Gait WNL  Posture Seated: R arm internally rotated and with elbow contracture out of sling   Cervical Screen AROM: WFL and painless with overpressure in all planes  AROM AROM (Normal range in degrees) AROM/PROM   Cervical  Flexion (50)   Extension (80)   Right lateral flexion (45)   Left lateral flexion (45)   Right rotation (85)   Left rotation (85)    Right Left  Shoulder    Flexion NT/80 deg  140  Extension    Abduction NT/40 deg 160  External Rotation    Internal Rotation  WNL  Hands Behind Head  WNL  Hands Behind Back        Elbow  WNL  Flexion  WNL  Extension    Pronation    Supination    (* = pain; Blank rows = not tested)  UE MMT: MMT (out of 5) Right Left   Cervical (isometric)  Flexion WNL  Extension WNL  Lateral Flexion WNL WNL  Rotation WNL WNL      Shoulder   Flexion NT 4  Extension    Abduction NT 4  External rotation NT 4  Internal rotation NT 4  Horizontal abduction    Horizontal adduction    Lower Trapezius    Rhomboids    `    Elbow  Flexion NT 5  Extension NT 5  Pronation  5  Supination  5      (* = pain; Blank rows = not tested)  Sensation Decreased Sensation along lateral aspect of upper Arm.    Reflexes Deferred   Palpation Location LEFT  RIGHT           Subocciptials    Cervical paraspinals    Upper Trapezius  1  Levator Scapulae  1  Rhomboid Major/Minor    Sternoclavicular joint    Acromioclavicular joint    Coracoid process    Long head of biceps    Supraspinatus    Infraspinatus    Subscapularis    Teres Minor    Teres Major    Pectoralis Major    Pectoralis Minor    Anterior Deltoid  1  Lateral Deltoid  1  Posterior Deltoid  1  Latissimus Dorsi    Sternocleidomastoid    (Blank rows = not tested) Graded on 0-4 scale (0 = no pain, 1 = pain, 2 = pain with wincing/grimacing/flinching, 3 = pain with withdrawal, 4 = unwilling to allow palpation), (Blank rows = not tested)   Passive Accessory Intervertebral Motion Pt denies reproduction of shoulder pain with CPA C2-T7 and UPA bilaterally C2-T7. Generally, hypomobile throughout  Accessory Motions/Glides Deferred  Muscle Length Testing Deferred  SPECIAL TESTS Deferred  TODAY'S TREATMENT  DATE: 02/01/2024  Per Dr. Genelle, MD (12/28/2023): At this time he will be allowed to do active range of motion as tolerated. I would like have no lifting greater than 15 pounds. I will plan to see him in 6 weeks  for reassessment  Subjective:.Patient reports that his lateral shoulder pain hasn't improved. The pain remains 8/10 and the prescribed GABA has had minimum effect for the pain.No further questions or concerns   Therapeutic Exercise:  Seated Wrist Supination/Pronation   3 x 10 - 5# DB   Standing Bicep Curl  1 x 10 - 8# DB   1 x 10 - 5# DB   Standing Tricep Pull Down    3 x 10 - Blue TB   Standing Mid Row   3 x 10 - Blue TB   Passive R Shoulder Flexion Stretch   3  min  Passive R Shoulder Abduction Stretch   3 min   Passive R shoulder ER  2 min   Time Spent reviewing modifications to HEP and removing PROM exercises and replacing with AAROM/AROM exercises.   Therapeutic Activity (with intent  to improve upward/outward reaching and carrying):  UBE - 5 min - 2.5 min Fwd, 2.5 min Retro - Level 12-8 for UE  strength and muscular endurance; PT manually adjusted resistance throughout to patient's tolerance.  AAROM Shoulder Flexion against Stair Rail   2 x 10 reps    AAROM - Shoulder Flexion with Dowel   4 x 10 reps  Hooklying Shoulder D2 Flexion   RUE:  1 x 10 - 2# DB    2 x 10 - against YTB   PATIENT EDUCATION:  Education details: Exercise Technique  Person educated: Patient Education method: Medical Illustrator Education comprehension: verbalized understanding and returned demonstration   HOME EXERCISE PROGRAM:  Access Code: [email or web address] URL: [email or web address] Date: 02/01/2024 Prepared by: Lonni Pall  Exercises - Seated Biceps Curl  - 2 x daily - 7 x weekly - 3 sets - 10-20 reps - Seated Scapular Retraction  - 2 x daily - 7 x weekly - 3 sets - 10-20 reps - Seated Shoulder External Rotation AAROM with Dowel  - 2 x daily - 7 x weekly - 2-3 sets - 10-12 reps - Seated Gripping Towel  - 2 x daily - 7 x weekly - 3 sets - 10-12 reps - Circular Shoulder Pendulum with Table Support  - 2 x daily - 7 x weekly - 3 sets - 20 reps - Seated Shoulder Flexion Towel Slide at Table Top  - 2 x daily - 7 x weekly - 3 sets - 10-12 reps - Seated Shoulder Abduction Towel Slide at Table Top  - 2 x daily - 7 x weekly - 3 sets - 10-12 reps - Shoulder Abduction with Dumbbells - Palms Down  - 1 x daily - 3-4 x weekly - 2-3 sets - 10 reps - Standing Shoulder Flexion to 90 Degrees with Dumbbells  - 1 x daily - 3-4 x weekly - 2-3 sets - 10 reps - Doorway Pec Stretch at 60 Elevation  - 2 x daily - 7 x weekly - 3 sets - 30s hold - Standing Shoulder Abduction AAROM with Dowel (Mirrored)  - 2 x daily - 7 x weekly - 3 sets - 10 reps - Standing Shoulder Flexion AAROM with Dowel  - 2 x daily - 7 x weekly - 3 sets - 10 reps  Access Code: [email or web address] URL:  [email or web address] Date: 01/02/2024 Prepared by: Lonni Pall  Exercises - Seated Biceps Curl  - 1 x daily - 7 x weekly - 3 sets - 10-20 reps - Seated Scapular Retraction  - 1 x daily - 7 x weekly - 3 sets - 10-20 reps -  Seated Gripping Towel  - 1 x daily - 7 x weekly - 3 sets - 10-12 reps - Circular Shoulder Pendulum with Table Support  - 1 x daily - 7 x weekly - 3 sets - 20 reps - Supine Shoulder Flexion with Dowel  - 2 x daily - 7 x weekly - 3 sets - 10-12 reps - Supine Shoulder External Rotation with Dowel  - 2 x daily - 7 x weekly - 3 sets - 10-12 reps - Supine Shoulder Horizontal Abduction Adduction AAROM with Dowel  - 2 x daily - 7 x weekly - 3 sets - 10-12 reps - Seated Shoulder Flexion Towel Slide at Table Top  - 2 x daily - 7 x weekly - 3 sets - 10-12 reps - Seated Shoulder Abduction Towel Slide at Table Top  - 7 x weekly - 3 sets - 10- reps  ASSESSMENT:  CLINICAL IMPRESSION: Jodie returns to OPPT for management of s/p ORIF on 11/15/2023. Patient is 10 weeks s/p surgery. Continued PT POC focused on increased AROM in the R Shoulder.  Pt tolerated elbow joint strengthening, bicep muscles fatigued quickly requiring regression in intensity. His Shoulder Flexion AAROM with the dowel improves however notable compensation with OH reach using spinal extensors; improved with AAROM shoulder flexion against the wall.  Progressed D2 Shoulder Flexion to Terriah Reggio against YTB with good tolerance. Patient able to achieve 30 deg of shoulder ER passively but still remains limited in AROM. Patient still presenting with severe limitations in RUE shoulder strength and ROM limiting his full participation in recreational and ADL tasks involving OH reaching. Based on today's performance, pt will continue to benefit from skilled PT in order to facilitate return to PLOF and improve QoL.  OBJECTIVE IMPAIRMENTS: decreased activity tolerance, decreased endurance, decreased ROM, decreased strength,  increased edema, increased fascial restrictions, increased muscle spasms, impaired sensation, and pain.   ACTIVITY LIMITATIONS: carrying, lifting, transfers, bathing, dressing, reach over head, and hygiene/grooming  PARTICIPATION LIMITATIONS: meal prep, cleaning, laundry, community activity, and occupation  PERSONAL FACTORS: Age, Past/current experiences, Time since onset of injury/illness/exacerbation, and 3+ comorbidities: Hx of COPD, ACDF, recent humeral fixation are also affecting patient's functional outcome.   REHAB POTENTIAL: Good  CLINICAL DECISION MAKING: Evolving/moderate complexity  EVALUATION COMPLEXITY: Moderate   GOALS: Goals reviewed with patient? Yes  SHORT TERM GOALS: Target date: 02/07/2024  Pt will be independent with HEP to improve strength and decrease shoulder pain to improve pain-free function at home and work. Baseline: Initial HEP provided  Goal status: INITIAL   LONG TERM GOALS: Target date: 0303/2026  Pt will decrease quick DASH score by at least 8% in order to demonstrate clinically significant reduction in disability with UE ADLs.  Baseline: 12/27/2023: TBD; 01/17/2024: 84.1 / 100 = 84.1 % Goal status: INITIAL  2.  Pt will decrease worst shoulder pain by at least 3 points on the NPRS in order to demonstrate clinically significant reduction in shoulder pain. Baseline: 12/27/2023: 10/10 NPS Goal status: INITIAL  3.  Pt will increase R shoulder flexion/abduction AROM to 140/180 respectively and pain free in order to demonstrate limb symmetry for OH lifting and ADLs.  Baseline: 12/27/2023:   Right Left  Shoulder    Flexion NT/80 deg  140  Extension    Abduction NT/40 deg 160   Goal status: INITIAL  4. Pt will increase R shoulder strength by at least 1/2 MMT grade in order to demonstrate improvement in strength and function  Baseline: 12/27/2023:  Shoulder   Flexion NT 4  Extension    Abduction NT 4  External rotation NT 4  Internal  rotation NT 4   Goal status: INITIAL  5.  Pt will increase R grip strength to at least 90% limb symmetry in order to demonstrate significant improvements in hand function for cutting, grasping items.  Baseline: 01/17/2024: 55#/110#  Goal status: INITIAL  PLAN:  PT FREQUENCY: 1-2x/week  PT DURATION: 12 weeks  PLANNED INTERVENTIONS: [id number]- PT Re-evaluation, 97750- Physical Performance Testing, 97110-Therapeutic exercises, 97530- Therapeutic activity, 97112- Neuromuscular re-education, 97535- Self Care, [id number]- Manual therapy, Patient/Family education, Joint mobilization, Cryotherapy, and Moist heat  PLAN FOR NEXT SESSION: Review HEP, Progress AAROM > AROM for R shoulder, Education on sling wear schedule per MD Protocol, Elbow and Wrist Mobility   Lonni Pall PT, DPT Physical Therapist- [place]  02/01/2024, 1:01 PM  "

## 2024-02-06 ENCOUNTER — Ambulatory Visit

## 2024-02-06 DIAGNOSIS — M6281 Muscle weakness (generalized): Secondary | ICD-10-CM

## 2024-02-06 DIAGNOSIS — M79621 Pain in right upper arm: Secondary | ICD-10-CM | POA: Diagnosis not present

## 2024-02-06 DIAGNOSIS — M25611 Stiffness of right shoulder, not elsewhere classified: Secondary | ICD-10-CM

## 2024-02-06 NOTE — Therapy (Signed)
 " OUTPATIENT PHYSICAL THERAPY SHOULDER/ELBOW TREATMENT/PROGRESS NOTE  Dates of reporting period  12/27/2023   to   02/06/2024    Patient Name: Destiny Wright MRN: [id number] DOB:1965-06-16, 59 y.o., adult Today's Date: 02/06/2024  END OF SESSION:  PT End of Session - 02/06/24 1304     Visit Number 10    Number of Visits 24    Date for Recertification  03/20/24    Authorization Type Auth 16 PT visits  12/9 -02/21/24  [id number]    Authorization - Visit Number 10    Authorization - Number of Visits 16    Progress Note Due on Visit 10    PT Start Time 1300    PT Stop Time 1340    PT Time Calculation (min) 40 min    Activity Tolerance Patient tolerated treatment well    Behavior During Therapy WFL for tasks assessed/performed            Past Medical History:  Diagnosis Date   Anxiety    Asthma    Basal cell carcinoma, arm    (bilateral forearm)   COPD (chronic obstructive pulmonary disease) (HCC)    Depression    Dyspepsia    Endometriosis    Gender dysphoria    GERD (gastroesophageal reflux disease)    Heart murmur 2018   echocardiogram   Hepatic steatosis    Hepatitis    History of Helicobacter pylori infection 2021   Hyperlipidemia    Hypertension    Hypothyroidism    Neuropathy of left foot    Osteoarthritis    PONV (postoperative nausea and vomiting)    PVC (premature ventricular contraction) 2008   echocardiogram   Stage 3b chronic kidney disease (CKD) (HCC)    Vitamin D deficiency    Past Surgical History:  Procedure Laterality Date   ANTERIOR CERVICAL DECOMP/DISCECTOMY FUSION  2014   BREAST BIOPSY Right 2012   COLONOSCOPY  2000   ESOPHAGOGASTRODUODENOSCOPY (EGD) WITH PROPOFOL  N/A 10/16/2019   Procedure: ESOPHAGOGASTRODUODENOSCOPY (EGD) WITH PROPOFOL ;  Surgeon: Unk Corinn Skiff, MD;  Location: [facility];  Service: Gastroenterology;  Laterality: N/A;   HAMMER TOE SURGERY Left 2015   KNEE ARTHROSCOPY Right 2004   arthritis   LAPAROSCOPY   2000   endometriosis   LYSIS OF ADHESION N/A 07/01/2022   Procedure: LYSIS OF ADHESION;  Surgeon: Verdon Keen, MD;  Location: [facility];  Service: Gynecology;  Laterality: N/A;   NASAL SEPTUM SURGERY  2009   ORIF SHOULDER FRACTURE Right 11/15/2023   Procedure: OPEN REDUCTION INTERNAL FIXATION (ORIF) SHOULDER FRACTURE;  Surgeon: Genelle Standing, MD;  Location: [facility];  Service: Orthopedics;  Laterality: Right;  RIGHT SHOULDER GREATER TUBEROSITY FRACTURE FIXATION   SIMPLE MASTECTOMY Bilateral 2017   with nipple graft   TONSILLECTOMY  2009   TOTAL ABDOMINAL HYSTERECTOMY W/ BILATERAL SALPINGOOPHORECTOMY  2006   TRACHELECTOMY N/A 07/01/2022   Procedure: ROBOTIC ASSISTED TRACHELECTOMY;  Surgeon: Verdon Keen, MD;  Location: [facility];  Service: Gynecology;  Laterality: N/A;   UPPER GI ENDOSCOPY  2003   Patient Active Problem List   Diagnosis Date Noted   Closed displaced fracture of greater tuberosity of right humerus 11/15/2023   Anterior dislocation of right shoulder 10/22/2023   Dyspepsia    Murmur, cardiac 05/14/2016   Premature ventricular contractions 05/14/2016   Endometriosis 08/31/2013    PCP: Epifanio Alm SQUIBB, MD  REFERRING PROVIDER:  Genelle Standing, MD     REFERRING DIAG:  S42.91XA (ICD-10-CM) - Closed fracture of right  shoulder, initial encounter    RATIONALE FOR EVALUATION AND TREATMENT: Rehabilitation  THERAPY DIAG: Pain in right upper arm  Stiffness of right shoulder, not elsewhere classified  Muscle weakness (generalized)  ONSET DATE: s/p Right greater tuberosity fracture ORIF 11/15/2023   FOLLOW-UP APPT SCHEDULED WITH REFERRING PROVIDER: Yes    SUBJECTIVE:                                                                                                                                                                                         SUBJECTIVE STATEMENT:    Patient reports to OPPT with R shoulder pain   PERTINENT HISTORY:   Destiny Wright is 59 y.o female presenting with R shoulder following Right greater tuberosity fracture ORIF 11/15/2023. Patient reports that there is increased swellign in the upper R arm and a sharp, stinging pain. He was told by Dr. Leonce reported increased fluid in the upper arm. There is still some numbness in the anterior and lateral upper arm below the incision. Patient has upper extremity immobilized in a sling since Oct 4th. He has been working on grip strength and active elbow extension/flexion.   Hand Dominance: Right   Denies bilateral numbness/tingling, chills, fevers, nausea.   Imaging (Per Chart Review):  EXAM: 1 VIEW XRAY OF THE RIGHT SHOULDER 11/30/2023 03:18:13 PM   COMPARISON: 15 days ago status post surgical internal fixation of proximal right humeral fracture no dislocation is noted.   CLINICAL HISTORY: post op   FINDINGS:   BONES AND JOINTS: Status post surgical internal fixation of proximal right humeral fracture. No dislocation is noted. The glenohumeral joint is normally aligned. No acute fracture. The [facility] joint is unremarkable in appearance.   SOFT TISSUES: No abnormal calcifications. Visualized lung is unremarkable.   IMPRESSION: 1. Status post surgical internal fixation of proximal right humeral fracture. 2. No dislocation.   Electronically signed by: Lynwood Seip MD 11/30/2023 04:32 PM EST RP Workstation: [id number]  PAIN:  Pain Intensity: Present: 8/10, Best: 8/10, Worst: 10/10 Pain location: R Shoulder Pain Quality: intermittent  Radiating: No  Numbness/Tingling: Yes Aggravating factors: Random Onset Relieving factors: Desensitizing, rubbing the shoulder   PRECAUTIONS: Shoulder PROM, AROM when cleared   Per [place] UE ORIF Protocol: (Discontinued - 01/02/2024)  Weeks 4-8:  Begin FULL ROM   PROMAAROMAROM   Sling as needed for comfort only   May begin isometric strengthening   Week 6 - Light resistance strengthening progression may begin     WEIGHT BEARING RESTRICTIONS: Yes    FALLS: Has patient fallen in last 6 months? Yes. Number of falls 1  Living Environment Lives with: lives alone  Lives in: House/apartment Stairs: Yes: Internal: 10-13 steps; on right going up Has following equipment at home: None  Prior level of function: Independent  Occupational demands:Total Disability   Hobbies: Biking (E-bike)   Patient Goals: I would like to improve ROM in the my R arm   OBJECTIVE:   Patient Surveys  QuickDASH:  Cognition Patient is oriented to person, place, and time.  Recent memory is intact.  Remote memory is intact.  Attention span and concentration are intact.  Expressive speech is intact.  Patient's fund of knowledge is within normal limits for educational level.    Gross Musculoskeletal Assessment Tremor: None Bulk: Normal Tone: Normal  Gait WNL  Posture Seated: R arm internally rotated and with elbow contracture out of sling   Cervical Screen AROM: WFL and painless with overpressure in all planes  AROM AROM (Normal range in degrees) AROM/PROM   Cervical  Flexion (50)   Extension (80)   Right lateral flexion (45)   Left lateral flexion (45)   Right rotation (85)   Left rotation (85)    Right Left  Shoulder    Flexion NT/80 deg  140  Extension    Abduction NT/40 deg 160  External Rotation    Internal Rotation  WNL  Hands Behind Head  WNL  Hands Behind Back        Elbow  WNL  Flexion  WNL  Extension    Pronation    Supination    (* = pain; Blank rows = not tested)  UE MMT: MMT (out of 5) Right Left   Cervical (isometric)  Flexion WNL  Extension WNL  Lateral Flexion WNL WNL  Rotation WNL WNL      Shoulder   Flexion NT 4  Extension    Abduction NT 4  External rotation NT 4  Internal rotation NT 4  Horizontal abduction    Horizontal adduction    Lower Trapezius    Rhomboids    `    Elbow  Flexion NT 5  Extension NT 5  Pronation  5  Supination  5      (*  = pain; Blank rows = not tested)  Sensation Decreased Sensation along lateral aspect of upper Arm.   Reflexes Deferred   Palpation Location LEFT  RIGHT           Subocciptials    Cervical paraspinals    Upper Trapezius  1  Levator Scapulae  1  Rhomboid Major/Minor    Sternoclavicular joint    Acromioclavicular joint    Coracoid process    Long head of biceps    Supraspinatus    Infraspinatus    Subscapularis    Teres Minor    Teres Major    Pectoralis Major    Pectoralis Minor    Anterior Deltoid  1  Lateral Deltoid  1  Posterior Deltoid  1  Latissimus Dorsi    Sternocleidomastoid    (Blank rows = not tested) Graded on 0-4 scale (0 = no pain, 1 = pain, 2 = pain with wincing/grimacing/flinching, 3 = pain with withdrawal, 4 = unwilling to allow palpation), (Blank rows = not tested)   Passive Accessory Intervertebral Motion Pt denies reproduction of shoulder pain with CPA C2-T7 and UPA bilaterally C2-T7. Generally, hypomobile throughout  Accessory Motions/Glides Deferred  Muscle Length Testing Deferred  SPECIAL TESTS Deferred  TODAY'S TREATMENT  DATE: 02/06/24  Per Dr. Genelle, MD (12/28/2023): At this time he will be allowed to do  active range of motion as tolerated. I would like have no lifting greater than 15 pounds. I will plan to see him in 6 weeks for reassessment  Subjective:.Patient reports that his lateral shoulder pain hasn't improved. The pain remains 8/10 and the prescribed GABA has had minimum effect for the pain.No further questions or concerns   Therapeutic Exercise:  Seated Shoulder Flexion   3 x 10 - 2# DB   Seated Shoulder Abduction- Bent Arm  3 x 10 - 2#  DB  Seated Wrist Supination/Pronation   3 x 10 - 5# DB   Seated Shoulder Row   3 x 10 - 15#   Seated Lat Pull   3 x 10 - 15#   Standing Mid Row   3 x 10 - Blue TB   Passive R shoulder ER Stretch  2 min   Therapeutic Activity (with intent to improve upward/outward reaching  and carrying):  UBE - 5 min - 2.5 min Fwd, 2.5 min Retro - Level 12-8 for UE  strength and muscular endurance; PT manually adjusted resistance throughout to patient's tolerance.  AAROM Shoulder Flexion with Foam Roller against wall   2 x 10 reps   Reclined Shoulder D2 Flexion   RUE:  2 x 10 - on [employer]   PATIENT EDUCATION:  Education details: Exercise Technique  Person educated: Patient Education method: Medical Illustrator Education comprehension: verbalized understanding and returned demonstration   HOME EXERCISE PROGRAM:  Access Code: [email or web address] URL: [email or web address] Date: 02/01/2024 Prepared by: Lonni Pall  Exercises - Seated Biceps Curl  - 2 x daily - 7 x weekly - 3 sets - 10-20 reps - Seated Scapular Retraction  - 2 x daily - 7 x weekly - 3 sets - 10-20 reps - Seated Shoulder External Rotation AAROM with Dowel  - 2 x daily - 7 x weekly - 2-3 sets - 10-12 reps - Seated Gripping Towel  - 2 x daily - 7 x weekly - 3 sets - 10-12 reps - Circular Shoulder Pendulum with Table Support  - 2 x daily - 7 x weekly - 3 sets - 20 reps - Seated Shoulder Flexion Towel Slide at Table Top  - 2 x daily - 7 x weekly - 3 sets - 10-12 reps - Seated Shoulder Abduction Towel Slide at Table Top  - 2 x daily - 7 x weekly - 3 sets - 10-12 reps - Shoulder Abduction with Dumbbells - Palms Down  - 1 x daily - 3-4 x weekly - 2-3 sets - 10 reps - Standing Shoulder Flexion to 90 Degrees with Dumbbells  - 1 x daily - 3-4 x weekly - 2-3 sets - 10 reps - Doorway Pec Stretch at 60 Elevation  - 2 x daily - 7 x weekly - 3 sets - 30s hold - Standing Shoulder Abduction AAROM with Dowel (Mirrored)  - 2 x daily - 7 x weekly - 3 sets - 10 reps - Standing Shoulder Flexion AAROM with Dowel  - 2 x daily - 7 x weekly - 3 sets - 10 reps  Access Code: [email or web address] URL: [email or web address] Date: 01/02/2024 Prepared by: Lonni Pall  Exercises - Seated Biceps Curl  - 1  x daily - 7 x weekly - 3 sets - 10-20 reps - Seated Scapular Retraction  - 1 x daily - 7 x weekly - 3 sets - 10-20 reps - Seated Gripping Towel  - 1 x daily - 7 x weekly - 3 sets - 10-12  reps - Circular Shoulder Pendulum with Table Support  - 1 x daily - 7 x weekly - 3 sets - 20 reps - Supine Shoulder Flexion with Dowel  - 2 x daily - 7 x weekly - 3 sets - 10-12 reps - Supine Shoulder External Rotation with Dowel  - 2 x daily - 7 x weekly - 3 sets - 10-12 reps - Supine Shoulder Horizontal Abduction Adduction AAROM with Dowel  - 2 x daily - 7 x weekly - 3 sets - 10-12 reps - Seated Shoulder Flexion Towel Slide at Table Top  - 2 x daily - 7 x weekly - 3 sets - 10-12 reps - Seated Shoulder Abduction Towel Slide at Table Top  - 7 x weekly - 3 sets - 10- reps  ASSESSMENT:  CLINICAL IMPRESSION: Jodie returns to OPPT for management of s/p ORIF on 11/15/2023. Patient is 10 weeks s/p surgery. He arrived for 10th visit warranting a progress note. Reviewed progress towards goals; patient demonstrating improvements in RUE ROM, Strength and function based on todays progress (see goals below). Patient's shoulder pain still consistently an 8/10 and still disrupts his sleep schedule. PROM patient still able to achieve >120 deg in shoulder flexion and > 110 in abduction however he still lacks strength to achieve ideal range. Patient un able to perform D2 flexion against gravity but did relatively well with in a reclined position. Per report on QuickDASH, (72.7 / 100 = 72.7 %) he is trending towards improved function however still has severe limitations due to pain. Patient still presenting with moderate limitations in RUE shoulder strength and ROM limiting his full participation in recreational and ADL tasks involving OH reaching. Based on today's performance, pt will continue to benefit from skilled PT in order to facilitate return to PLOF and improve QoL.   OBJECTIVE IMPAIRMENTS: decreased activity tolerance,  decreased endurance, decreased ROM, decreased strength, increased edema, increased fascial restrictions, increased muscle spasms, impaired sensation, and pain.   ACTIVITY LIMITATIONS: carrying, lifting, transfers, bathing, dressing, reach over head, and hygiene/grooming  PARTICIPATION LIMITATIONS: meal prep, cleaning, laundry, community activity, and occupation  PERSONAL FACTORS: Age, Past/current experiences, Time since onset of injury/illness/exacerbation, and 3+ comorbidities: Hx of COPD, ACDF, recent humeral fixation are also affecting patient's functional outcome.   REHAB POTENTIAL: Good  CLINICAL DECISION MAKING: Evolving/moderate complexity  EVALUATION COMPLEXITY: Moderate   GOALS: Goals reviewed with patient? Yes  SHORT TERM GOALS: Target date: 02/07/2024  Pt will be independent with HEP to improve strength and decrease shoulder pain to improve pain-free function at home and work. Baseline: Initial HEP provided  Goal status: INITIAL   LONG TERM GOALS: Target date: 0303/2026  Pt will decrease quick DASH score by at least 8% in order to demonstrate clinically significant reduction in disability with UE ADLs.  Baseline: 12/27/2023: TBD; 01/17/2024: 84.1 / 100 = 84.1 %; 02/06/2024: 72.7 / 100 = 72.7 % Goal status: Progressing  2.  Pt will decrease worst shoulder pain by at least 3 points on the NPRS in order to demonstrate clinically significant reduction in shoulder pain. Baseline: 12/27/2023: 10/10 NPS; 02/06/2024: 8/10  Goal status: Progressing  3.  Pt will increase R shoulder flexion/abduction AROM to 140/180 respectively and pain free in order to demonstrate limb symmetry for OH lifting and ADLs.  Baseline: 12/27/2023:   Right Left R: 02/06/2024  Shoulder     Flexion NT/80 deg  140 120 AROM  Extension     Abduction NT/40 deg 160 100 AROM  Goal status: INITIAL  4. Pt will increase R shoulder strength by at least 1/2 MMT grade in order to demonstrate improvement  in strength and function         Baseline: 12/27/2023:  Shoulder  R MMT (Out of 5)  Flexion NT 4 4-  Extension     Abduction NT 4 4-  External rotation NT 4 4-  Internal rotation NT 4 5   Goal status: INITIAL  5.  Pt will increase R grip strength to at least 90% limb symmetry in order to demonstrate significant improvements in hand function for cutting, grasping items.  Baseline: 01/17/2024: 55#/110#; 02/06/2023: 45#/100#  Goal status: INITIAL  PLAN:  PT FREQUENCY: 1-2x/week  PT DURATION: 12 weeks  PLANNED INTERVENTIONS: [id number]- PT Re-evaluation, 97750- Physical Performance Testing, 97110-Therapeutic exercises, 97530- Therapeutic activity, 97112- Neuromuscular re-education, 97535- Self Care, [id number]- Manual therapy, Patient/Family education, Joint mobilization, Cryotherapy, and Moist heat  PLAN FOR NEXT SESSION: Review HEP, Progress AAROM > AROM for R shoulder, Education on sling wear schedule per MD Protocol, Elbow and Wrist Mobility   Lonni Pall PT, DPT Physical Therapist- [place]  02/06/2024, 1:06 PM  "

## 2024-02-08 ENCOUNTER — Ambulatory Visit

## 2024-02-08 DIAGNOSIS — M79621 Pain in right upper arm: Secondary | ICD-10-CM

## 2024-02-08 DIAGNOSIS — M25611 Stiffness of right shoulder, not elsewhere classified: Secondary | ICD-10-CM

## 2024-02-08 DIAGNOSIS — M6281 Muscle weakness (generalized): Secondary | ICD-10-CM

## 2024-02-08 NOTE — Therapy (Signed)
 " OUTPATIENT PHYSICAL THERAPY SHOULDER/ELBOW TREATMENT   Patient Name: Destiny Wright MRN: [id number] DOB:[birth year], 59 y.o., adult Today's Date: 02/08/2024  END OF SESSION:  PT End of Session - 02/08/24 1302     Visit Number 11    Number of Visits 24    Date for Recertification  03/20/24    Authorization Type Auth 16 PT visits  12/9 -02/21/24  [id number]    Authorization - Visit Number 11    Authorization - Number of Visits 16    Progress Note Due on Visit 10    PT Start Time 1301    PT Stop Time 1340    PT Time Calculation (min) 39 min    Activity Tolerance Patient tolerated treatment well    Behavior During Therapy WFL for tasks assessed/performed             Past Medical History:  Diagnosis Date   Anxiety    Asthma    Basal cell carcinoma, arm    (bilateral forearm)   COPD (chronic obstructive pulmonary disease) (HCC)    Depression    Dyspepsia    Endometriosis    Gender dysphoria    GERD (gastroesophageal reflux disease)    Heart murmur 2018   echocardiogram   Hepatic steatosis    Hepatitis    History of Helicobacter pylori infection 2021   Hyperlipidemia    Hypertension    Hypothyroidism    Neuropathy of left foot    Osteoarthritis    PONV (postoperative nausea and vomiting)    PVC (premature ventricular contraction) 2008   echocardiogram   Stage 3b chronic kidney disease (CKD) (HCC)    Vitamin D deficiency    Past Surgical History:  Procedure Laterality Date   ANTERIOR CERVICAL DECOMP/DISCECTOMY FUSION  2014   BREAST BIOPSY Right 2012   COLONOSCOPY  2000   ESOPHAGOGASTRODUODENOSCOPY (EGD) WITH PROPOFOL  N/A 10/16/2019   Procedure: ESOPHAGOGASTRODUODENOSCOPY (EGD) WITH PROPOFOL ;  Surgeon: Unk Corinn Skiff, MD;  Location: [facility];  Service: Gastroenterology;  Laterality: N/A;   HAMMER TOE SURGERY Left 2015   KNEE ARTHROSCOPY Right 2004   arthritis   LAPAROSCOPY  2000   endometriosis   LYSIS OF ADHESION N/A 07/01/2022   Procedure:  LYSIS OF ADHESION;  Surgeon: Verdon Keen, MD;  Location: [facility];  Service: Gynecology;  Laterality: N/A;   NASAL SEPTUM SURGERY  2009   ORIF SHOULDER FRACTURE Right 11/15/2023   Procedure: OPEN REDUCTION INTERNAL FIXATION (ORIF) SHOULDER FRACTURE;  Surgeon: Genelle Standing, MD;  Location: [facility];  Service: Orthopedics;  Laterality: Right;  RIGHT SHOULDER GREATER TUBEROSITY FRACTURE FIXATION   SIMPLE MASTECTOMY Bilateral 2017   with nipple graft   TONSILLECTOMY  2009   TOTAL ABDOMINAL HYSTERECTOMY W/ BILATERAL SALPINGOOPHORECTOMY  2006   TRACHELECTOMY N/A 07/01/2022   Procedure: ROBOTIC ASSISTED TRACHELECTOMY;  Surgeon: Verdon Keen, MD;  Location: [facility];  Service: Gynecology;  Laterality: N/A;   UPPER GI ENDOSCOPY  2003   Patient Active Problem List   Diagnosis Date Noted   Closed displaced fracture of greater tuberosity of right humerus 11/15/2023   Anterior dislocation of right shoulder 10/22/2023   Dyspepsia    Murmur, cardiac 05/14/2016   Premature ventricular contractions 05/14/2016   Endometriosis 08/31/2013    PCP: Epifanio Alm SQUIBB, MD  REFERRING PROVIDER:  Genelle Standing, MD     REFERRING DIAG:  S42.91XA (ICD-10-CM) - Closed fracture of right shoulder, initial encounter    RATIONALE FOR EVALUATION AND TREATMENT: Rehabilitation  THERAPY  DIAG: Pain in right upper arm  Stiffness of right shoulder, not elsewhere classified  Muscle weakness (generalized)  ONSET DATE: s/p Right greater tuberosity fracture ORIF 11/15/2023   FOLLOW-UP APPT SCHEDULED WITH REFERRING PROVIDER: Yes    SUBJECTIVE:                                                                                                                                                                                         SUBJECTIVE STATEMENT:    Patient reports to OPPT with R shoulder pain   PERTINENT HISTORY:   Destiny Wright is 59 y.o female presenting with R shoulder following Right greater  tuberosity fracture ORIF 11/15/2023. Patient reports that there is increased swellign in the upper R arm and a sharp, stinging pain. He was told by Dr. Leonce reported increased fluid in the upper arm. There is still some numbness in the anterior and lateral upper arm below the incision. Patient has upper extremity immobilized in a sling since Oct 4th. He has been working on grip strength and active elbow extension/flexion.   Hand Dominance: Right   Denies bilateral numbness/tingling, chills, fevers, nausea.   Imaging (Per Chart Review):  EXAM: 1 VIEW XRAY OF THE RIGHT SHOULDER 11/30/2023 03:18:13 PM   COMPARISON: 15 days ago status post surgical internal fixation of proximal right humeral fracture no dislocation is noted.   CLINICAL HISTORY: post op   FINDINGS:   BONES AND JOINTS: Status post surgical internal fixation of proximal right humeral fracture. No dislocation is noted. The glenohumeral joint is normally aligned. No acute fracture. The [facility] joint is unremarkable in appearance.   SOFT TISSUES: No abnormal calcifications. Visualized lung is unremarkable.   IMPRESSION: 1. Status post surgical internal fixation of proximal right humeral fracture. 2. No dislocation.   Electronically signed by: Lynwood Seip MD 11/30/2023 04:32 PM EST RP Workstation: [id number]  PAIN:  Pain Intensity: Present: 8/10, Best: 8/10, Worst: 10/10 Pain location: R Shoulder Pain Quality: intermittent  Radiating: No  Numbness/Tingling: Yes Aggravating factors: Random Onset Relieving factors: Desensitizing, rubbing the shoulder   PRECAUTIONS: Shoulder PROM, AROM when cleared   Per [place] UE ORIF Protocol: (Discontinued - 01/02/2024)  Weeks 4-8:  Begin FULL ROM   PROMAAROMAROM   Sling as needed for comfort only   May begin isometric strengthening   Week 6 - Light resistance strengthening progression may begin    WEIGHT BEARING RESTRICTIONS: Yes    FALLS: Has patient fallen in  last 6 months? Yes. Number of falls 1  Living Environment Lives with: lives alone Lives in: House/apartment Stairs: Yes: Internal: 10-13 steps; on right going up Has following  equipment at home: None  Prior level of function: Independent  Occupational demands:Total Disability   Hobbies: Biking (E-bike)   Patient Goals: I would like to improve ROM in the my R arm   OBJECTIVE:   Patient Surveys  QuickDASH:  Cognition Patient is oriented to person, place, and time.  Recent memory is intact.  Remote memory is intact.  Attention span and concentration are intact.  Expressive speech is intact.  Patient's fund of knowledge is within normal limits for educational level.    Gross Musculoskeletal Assessment Tremor: None Bulk: Normal Tone: Normal  Gait WNL  Posture Seated: R arm internally rotated and with elbow contracture out of sling   Cervical Screen AROM: WFL and painless with overpressure in all planes  AROM AROM (Normal range in degrees) AROM/PROM   Cervical  Flexion (50)   Extension (80)   Right lateral flexion (45)   Left lateral flexion (45)   Right rotation (85)   Left rotation (85)    Right Left  Shoulder    Flexion NT/80 deg  140  Extension    Abduction NT/40 deg 160  External Rotation    Internal Rotation  WNL  Hands Behind Head  WNL  Hands Behind Back        Elbow  WNL  Flexion  WNL  Extension    Pronation    Supination    (* = pain; Blank rows = not tested)  UE MMT: MMT (out of 5) Right Left   Cervical (isometric)  Flexion WNL  Extension WNL  Lateral Flexion WNL WNL  Rotation WNL WNL      Shoulder   Flexion NT 4  Extension    Abduction NT 4  External rotation NT 4  Internal rotation NT 4  Horizontal abduction    Horizontal adduction    Lower Trapezius    Rhomboids    `    Elbow  Flexion NT 5  Extension NT 5  Pronation  5  Supination  5      (* = pain; Blank rows = not tested)  Sensation Decreased Sensation along  lateral aspect of upper Arm.   Reflexes Deferred   Palpation Location LEFT  RIGHT           Subocciptials    Cervical paraspinals    Upper Trapezius  1  Levator Scapulae  1  Rhomboid Major/Minor    Sternoclavicular joint    Acromioclavicular joint    Coracoid process    Long head of biceps    Supraspinatus    Infraspinatus    Subscapularis    Teres Minor    Teres Major    Pectoralis Major    Pectoralis Minor    Anterior Deltoid  1  Lateral Deltoid  1  Posterior Deltoid  1  Latissimus Dorsi    Sternocleidomastoid    (Blank rows = not tested) Graded on 0-4 scale (0 = no pain, 1 = pain, 2 = pain with wincing/grimacing/flinching, 3 = pain with withdrawal, 4 = unwilling to allow palpation), (Blank rows = not tested)   Passive Accessory Intervertebral Motion Pt denies reproduction of shoulder pain with CPA C2-T7 and UPA bilaterally C2-T7. Generally, hypomobile throughout  Accessory Motions/Glides Deferred  Muscle Length Testing Deferred  SPECIAL TESTS Deferred  TODAY'S TREATMENT  DATE: 02/08/24  Per Dr. Genelle, MD (12/28/2023): At this time he will be allowed to do active range of motion as tolerated. I would like have no lifting greater than  15 pounds. I will plan to see him in 6 weeks for reassessment  Subjective:.Patient reports that his lateral shoulder pain hasn't improved. The pain remains 8/10 and the prescribed GABA has had minimum effect for the pain.No further questions or concerns   Therapeutic Exercise:  Seated Lat Pull   3 x 10 - 15#   Standing Shoulder Row   2 x 10 - Blue TB   1 x 10 - Black TB  Reclined on Blue Bolster - D2 Flexion   2 x 10s   Shoulder ER at 90 deg abd, elbow supported by blue bolster  3 x 10 - 2# DB   Passive R shoulder ER Stretch  2 min   Therapeutic Activity (with intent to improve upward/outward reaching and carrying):  AAROM Shoulder Flexion with Red Bolster (3#) against wall   1 x 10  AROM Wall Circles for  upward reaching   1 x 20 - CW  1 x 20 - CCW directoin   Reclined Shoulder D2 Flexion   RUE:  2 x 10 - on Blue Bolster   Standing Shoulder Flexion   3 x 10 - 2# DB   Standing Shoulder Abduction - Straight arms  3 x 10 - 2#  DB  PATIENT EDUCATION:  Education details: Exercise Technique  Person educated: Patient Education method: Medical Illustrator Education comprehension: verbalized understanding and returned demonstration   HOME EXERCISE PROGRAM:  Access Code: [email or web address] URL: [email or web address] Date: 02/08/2024 Prepared by: Lonni Pall  Exercises - Seated Biceps Curl  - 2 x daily - 7 x weekly - 3 sets - 10-20 reps - Seated Shoulder External Rotation AAROM with Dowel  - 2 x daily - 7 x weekly - 2-3 sets - 10-12 reps - Seated Gripping Towel  - 2 x daily - 7 x weekly - 3 sets - 10-12 reps - Circular Shoulder Pendulum with Table Support  - 2 x daily - 7 x weekly - 3 sets - 20 reps - Seated Shoulder Flexion Towel Slide at Table Top  - 2 x daily - 7 x weekly - 3 sets - 10-12 reps - Seated Shoulder Abduction Towel Slide at Table Top  - 2 x daily - 7 x weekly - 3 sets - 10-12 reps - Shoulder Abduction with Dumbbells - Palms Down  - 1 x daily - 3-4 x weekly - 2-3 sets - 10 reps - Standing Shoulder Flexion to 90 Degrees with Dumbbells  - 1 x daily - 3-4 x weekly - 2-3 sets - 10 reps - Forearm Pronation and Supination with Hammer  - 1 x daily - 3-4 x weekly - 2-3 sets - 10-12 reps - Standing Shoulder Row with Anchored Resistance  - 1 x daily - 3-4 x weekly - 2-3 sets - 10-12 reps - Seated Single-Shoulder PNF D2 Flexion With Anchored Resistance  - 2 x daily - 7 x weekly - 2-3 sets - 10 reps - arom only no weight or resistance - Doorway Pec Stretch at 60 Elevation  - 2 x daily - 7 x weekly - 3 sets - 30s hold - Standing Shoulder Abduction AAROM with Dowel (Mirrored)  - 2 x daily - 7 x weekly - 3 sets - 10 reps - Standing Shoulder Flexion AAROM with Dowel  - 2 x  daily - 7 x weekly - 3 sets - 10 reps  Access Code: [email or web address] URL: [email or web address] Date: 02/01/2024 Prepared by: Lonni Pall  Exercises - Seated Biceps Curl  -  2 x daily - 7 x weekly - 3 sets - 10-20 reps - Seated Scapular Retraction  - 2 x daily - 7 x weekly - 3 sets - 10-20 reps - Seated Shoulder External Rotation AAROM with Dowel  - 2 x daily - 7 x weekly - 2-3 sets - 10-12 reps - Seated Gripping Towel  - 2 x daily - 7 x weekly - 3 sets - 10-12 reps - Circular Shoulder Pendulum with Table Support  - 2 x daily - 7 x weekly - 3 sets - 20 reps - Seated Shoulder Flexion Towel Slide at Table Top  - 2 x daily - 7 x weekly - 3 sets - 10-12 reps - Seated Shoulder Abduction Towel Slide at Table Top  - 2 x daily - 7 x weekly - 3 sets - 10-12 reps - Shoulder Abduction with Dumbbells - Palms Down  - 1 x daily - 3-4 x weekly - 2-3 sets - 10 reps - Standing Shoulder Flexion to 90 Degrees with Dumbbells  - 1 x daily - 3-4 x weekly - 2-3 sets - 10 reps - Doorway Pec Stretch at 60 Elevation  - 2 x daily - 7 x weekly - 3 sets - 30s hold - Standing Shoulder Abduction AAROM with Dowel (Mirrored)  - 2 x daily - 7 x weekly - 3 sets - 10 reps - Standing Shoulder Flexion AAROM with Dowel  - 2 x daily - 7 x weekly - 3 sets - 10 reps  ASSESSMENT:  CLINICAL IMPRESSION: Destiny Wright returns to OPPT for management of s/p ORIF on 11/15/2023. Patient is 10 weeks s/p surgery. Continued focus on improving patient R shoulder ROM and strength. Patient tolerated all interventions without exacerbation of R shoulder pain. Reviewed D2 flexion in the reclined position in order to improve scapular elevation in various planes for reaching at different heights. He continues to make steady improvements towards AROM. Patient still presenting with deficits in RUE strength, ROM ( < 120 deg abd,  < 140 deg flex) and still experiences moderate to severe pain at end range. Shoulder ER addressed with light resistance while  elbow supported by blue bolster; Pt able to achieve ~ 20 deg of AROM while supported at 90 deg abd. Updated HEP to include additional AROM exercises .  These limitations prevent full participation in recreational activities. Based on today's performance, pt will continue to benefit from skilled PT in order to facilitate return to PLOF and improve QoL.  OBJECTIVE IMPAIRMENTS: decreased activity tolerance, decreased endurance, decreased ROM, decreased strength, increased edema, increased fascial restrictions, increased muscle spasms, impaired sensation, and pain.   ACTIVITY LIMITATIONS: carrying, lifting, transfers, bathing, dressing, reach over head, and hygiene/grooming  PARTICIPATION LIMITATIONS: meal prep, cleaning, laundry, community activity, and occupation  PERSONAL FACTORS: Age, Past/current experiences, Time since onset of injury/illness/exacerbation, and 3+ comorbidities: Hx of COPD, ACDF, recent humeral fixation are also affecting patient's functional outcome.   REHAB POTENTIAL: Good  CLINICAL DECISION MAKING: Evolving/moderate complexity  EVALUATION COMPLEXITY: Moderate   GOALS: Goals reviewed with patient? Yes  SHORT TERM GOALS: Target date: 02/07/2024  Pt will be independent with HEP to improve strength and decrease shoulder pain to improve pain-free function at home and work. Baseline: Initial HEP provided  Goal status: INITIAL   LONG TERM GOALS: Target date: 0303/2026  Pt will decrease quick DASH score by at least 8% in order to demonstrate clinically significant reduction in disability with UE ADLs.  Baseline: 12/27/2023: TBD; 01/17/2024: 84.1 / 100 =  84.1 %; 02/06/2024: 72.7 / 100 = 72.7 % Goal status: Progressing  2.  Pt will decrease worst shoulder pain by at least 3 points on the NPRS in order to demonstrate clinically significant reduction in shoulder pain. Baseline: 12/27/2023: 10/10 NPS; 02/06/2024: 8/10  Goal status: Progressing  3.  Pt will increase R  shoulder flexion/abduction AROM to 140/180 respectively and pain free in order to demonstrate limb symmetry for OH lifting and ADLs.  Baseline: 12/27/2023:   Right Left R: 02/06/2024  Shoulder     Flexion NT/80 deg  140 120 AROM  Extension     Abduction NT/40 deg 160 100 AROM   Goal status: INITIAL  4. Pt will increase R shoulder strength by at least 1/2 MMT grade in order to demonstrate improvement in strength and function         Baseline: 12/27/2023:  Shoulder  R MMT (Out of 5)  Flexion NT 4 4-  Extension     Abduction NT 4 4-  External rotation NT 4 4-  Internal rotation NT 4 5   Goal status: INITIAL  5.  Pt will increase R grip strength to at least 90% limb symmetry in order to demonstrate significant improvements in hand function for cutting, grasping items.  Baseline: 01/17/2024: 55#/110#; 02/06/2023: 45#/100#  Goal status: INITIAL  PLAN:  PT FREQUENCY: 1-2x/week  PT DURATION: 12 weeks  PLANNED INTERVENTIONS: [id number]- PT Re-evaluation, 97750- Physical Performance Testing, 97110-Therapeutic exercises, 97530- Therapeutic activity, 97112- Neuromuscular re-education, 97535- Self Care, [id number]- Manual therapy, Patient/Family education, Joint mobilization, Cryotherapy, and Moist heat  PLAN FOR NEXT SESSION: Review HEP, Progress AAROM > AROM for R shoulder, Education on sling wear schedule per MD Protocol, Elbow and Wrist Mobility   Lonni Pall PT, DPT Physical Therapist- [place]  02/08/2024, 3:42 PM  "

## 2024-02-13 ENCOUNTER — Ambulatory Visit

## 2024-02-15 ENCOUNTER — Ambulatory Visit

## 2024-02-15 DIAGNOSIS — M79621 Pain in right upper arm: Secondary | ICD-10-CM | POA: Diagnosis not present

## 2024-02-15 DIAGNOSIS — M6281 Muscle weakness (generalized): Secondary | ICD-10-CM

## 2024-02-15 DIAGNOSIS — M25611 Stiffness of right shoulder, not elsewhere classified: Secondary | ICD-10-CM

## 2024-02-15 NOTE — Therapy (Signed)
 " OUTPATIENT PHYSICAL THERAPY SHOULDER/ELBOW TREATMENT   Patient Name: Destiny Wright MRN: [id number] DOB:1965/07/26, 59 y.o., adult Today's Date: 02/15/2024  END OF SESSION:  PT End of Session - 02/15/24 1512     Visit Number 12    Number of Visits 24    Date for Recertification  03/20/24    Authorization Type Auth 16 PT visits  12/9 -02/21/24  [id number]    Authorization - Visit Number 12    Authorization - Number of Visits 16    Progress Note Due on Visit 10    PT Start Time 1515    PT Stop Time 1600    PT Time Calculation (min) 45 min    Activity Tolerance Patient tolerated treatment well    Behavior During Therapy WFL for tasks assessed/performed             Past Medical History:  Diagnosis Date   Anxiety    Asthma    Basal cell carcinoma, arm    (bilateral forearm)   COPD (chronic obstructive pulmonary disease) (HCC)    Depression    Dyspepsia    Endometriosis    Gender dysphoria    GERD (gastroesophageal reflux disease)    Heart murmur 2018   echocardiogram   Hepatic steatosis    Hepatitis    History of Helicobacter pylori infection 2021   Hyperlipidemia    Hypertension    Hypothyroidism    Neuropathy of left foot    Osteoarthritis    PONV (postoperative nausea and vomiting)    PVC (premature ventricular contraction) 2008   echocardiogram   Stage 3b chronic kidney disease (CKD) (HCC)    Vitamin D deficiency    Past Surgical History:  Procedure Laterality Date   ANTERIOR CERVICAL DECOMP/DISCECTOMY FUSION  2014   BREAST BIOPSY Right 2012   COLONOSCOPY  2000   ESOPHAGOGASTRODUODENOSCOPY (EGD) WITH PROPOFOL  N/A 10/16/2019   Procedure: ESOPHAGOGASTRODUODENOSCOPY (EGD) WITH PROPOFOL ;  Surgeon: Unk Corinn Skiff, MD;  Location: [facility];  Service: Gastroenterology;  Laterality: N/A;   HAMMER TOE SURGERY Left 2015   KNEE ARTHROSCOPY Right 2004   arthritis   LAPAROSCOPY  2000   endometriosis   LYSIS OF ADHESION N/A 07/01/2022   Procedure:  LYSIS OF ADHESION;  Surgeon: Verdon Keen, MD;  Location: [facility];  Service: Gynecology;  Laterality: N/A;   NASAL SEPTUM SURGERY  2009   ORIF SHOULDER FRACTURE Right 11/15/2023   Procedure: OPEN REDUCTION INTERNAL FIXATION (ORIF) SHOULDER FRACTURE;  Surgeon: Genelle Standing, MD;  Location: [facility];  Service: Orthopedics;  Laterality: Right;  RIGHT SHOULDER GREATER TUBEROSITY FRACTURE FIXATION   SIMPLE MASTECTOMY Bilateral 2017   with nipple graft   TONSILLECTOMY  2009   TOTAL ABDOMINAL HYSTERECTOMY W/ BILATERAL SALPINGOOPHORECTOMY  2006   TRACHELECTOMY N/A 07/01/2022   Procedure: ROBOTIC ASSISTED TRACHELECTOMY;  Surgeon: Verdon Keen, MD;  Location: [facility];  Service: Gynecology;  Laterality: N/A;   UPPER GI ENDOSCOPY  2003   Patient Active Problem List   Diagnosis Date Noted   Closed displaced fracture of greater tuberosity of right humerus 11/15/2023   Anterior dislocation of right shoulder 10/22/2023   Dyspepsia    Murmur, cardiac 05/14/2016   Premature ventricular contractions 05/14/2016   Endometriosis 08/31/2013    PCP: Epifanio Alm SQUIBB, MD  REFERRING PROVIDER:  Genelle Standing, MD     REFERRING DIAG:  S42.91XA (ICD-10-CM) - Closed fracture of right shoulder, initial encounter    RATIONALE FOR EVALUATION AND TREATMENT: Rehabilitation  THERAPY  DIAG: No diagnosis found.  ONSET DATE: s/p Right greater tuberosity fracture ORIF 11/15/2023   FOLLOW-UP APPT SCHEDULED WITH REFERRING PROVIDER: Yes    SUBJECTIVE:                                                                                                                                                                                         SUBJECTIVE STATEMENT:    Patient reports to OPPT with R shoulder pain   PERTINENT HISTORY:   Destiny Wright is 59 y.o female presenting with R shoulder following Right greater tuberosity fracture ORIF 11/15/2023. Patient reports that there is increased swellign in the  upper R arm and a sharp, stinging pain. He was told by Dr. Leonce reported increased fluid in the upper arm. There is still some numbness in the anterior and lateral upper arm below the incision. Patient has upper extremity immobilized in a sling since Oct 4th. He has been working on grip strength and active elbow extension/flexion.   Hand Dominance: Right   Denies bilateral numbness/tingling, chills, fevers, nausea.   Imaging (Per Chart Review):  EXAM: 1 VIEW XRAY OF THE RIGHT SHOULDER 11/30/2023 03:18:13 PM   COMPARISON: 15 days ago status post surgical internal fixation of proximal right humeral fracture no dislocation is noted.   CLINICAL HISTORY: post op   FINDINGS:   BONES AND JOINTS: Status post surgical internal fixation of proximal right humeral fracture. No dislocation is noted. The glenohumeral joint is normally aligned. No acute fracture. The [facility] joint is unremarkable in appearance.   SOFT TISSUES: No abnormal calcifications. Visualized lung is unremarkable.   IMPRESSION: 1. Status post surgical internal fixation of proximal right humeral fracture. 2. No dislocation.   Electronically signed by: Lynwood Seip MD 11/30/2023 04:32 PM EST RP Workstation: [id number]  PAIN:  Pain Intensity: Present: 8/10, Best: 8/10, Worst: 10/10 Pain location: R Shoulder Pain Quality: intermittent  Radiating: No  Numbness/Tingling: Yes Aggravating factors: Random Onset Relieving factors: Desensitizing, rubbing the shoulder   PRECAUTIONS: Shoulder PROM, AROM when cleared   Per [place] UE ORIF Protocol: (Discontinued - 01/02/2024)  Weeks 4-8:  Begin FULL ROM   PROMAAROMAROM   Sling as needed for comfort only   May begin isometric strengthening   Week 6 - Light resistance strengthening progression may begin    WEIGHT BEARING RESTRICTIONS: Yes    FALLS: Has patient fallen in last 6 months? Yes. Number of falls 1  Living Environment Lives with: lives alone Lives  in: House/apartment Stairs: Yes: Internal: 10-13 steps; on right going up Has following equipment at home: None  Prior level of function: Independent  Occupational demands:Total Disability  Hobbies: Biking (E-bike)   Patient Goals: I would like to improve ROM in the my R arm   OBJECTIVE:   Patient Surveys  QuickDASH:  Cognition Patient is oriented to person, place, and time.  Recent memory is intact.  Remote memory is intact.  Attention span and concentration are intact.  Expressive speech is intact.  Patient's fund of knowledge is within normal limits for educational level.    Gross Musculoskeletal Assessment Tremor: None Bulk: Normal Tone: Normal  Gait WNL  Posture Seated: R arm internally rotated and with elbow contracture out of sling   Cervical Screen AROM: WFL and painless with overpressure in all planes  AROM AROM (Normal range in degrees) AROM/PROM   Cervical  Flexion (50)   Extension (80)   Right lateral flexion (45)   Left lateral flexion (45)   Right rotation (85)   Left rotation (85)    Right Left  Shoulder    Flexion NT/80 deg  140  Extension    Abduction NT/40 deg 160  External Rotation    Internal Rotation  WNL  Hands Behind Head  WNL  Hands Behind Back        Elbow  WNL  Flexion  WNL  Extension    Pronation    Supination    (* = pain; Blank rows = not tested)  UE MMT: MMT (out of 5) Right Left   Cervical (isometric)  Flexion WNL  Extension WNL  Lateral Flexion WNL WNL  Rotation WNL WNL      Shoulder   Flexion NT 4  Extension    Abduction NT 4  External rotation NT 4  Internal rotation NT 4  Horizontal abduction    Horizontal adduction    Lower Trapezius    Rhomboids    `    Elbow  Flexion NT 5  Extension NT 5  Pronation  5  Supination  5      (* = pain; Blank rows = not tested)  Sensation Decreased Sensation along lateral aspect of upper Arm.   Reflexes Deferred   Palpation Location LEFT  RIGHT            Subocciptials    Cervical paraspinals    Upper Trapezius  1  Levator Scapulae  1  Rhomboid Major/Minor    Sternoclavicular joint    Acromioclavicular joint    Coracoid process    Long head of biceps    Supraspinatus    Infraspinatus    Subscapularis    Teres Minor    Teres Major    Pectoralis Major    Pectoralis Minor    Anterior Deltoid  1  Lateral Deltoid  1  Posterior Deltoid  1  Latissimus Dorsi    Sternocleidomastoid    (Blank rows = not tested) Graded on 0-4 scale (0 = no pain, 1 = pain, 2 = pain with wincing/grimacing/flinching, 3 = pain with withdrawal, 4 = unwilling to allow palpation), (Blank rows = not tested)   Passive Accessory Intervertebral Motion Pt denies reproduction of shoulder pain with CPA C2-T7 and UPA bilaterally C2-T7. Generally, hypomobile throughout  Accessory Motions/Glides Deferred  Muscle Length Testing Deferred  SPECIAL TESTS Deferred  TODAY'S TREATMENT  DATE: 02/15/24  Per Dr. Genelle, MD (12/28/2023): At this time he will be allowed to do active range of motion as tolerated. I would like have no lifting greater than 15 pounds. I will plan to see him in 6 weeks for reassessment  Subjective:.Patient reports  7/10 pain in the R lateral arm. He reports that the shooting pain sensation has not improved since beginning of POC. No further questions or concerns   Therapeutic Exercise:  Seated Shoulder Row   3 x 15 - 15#   Seated Lat Pull   3 x 15 - 15#  Sidelying Shoulder ER - Elbow @ neutral   3 x 10 - 2# DB   Serratus Wall Slides  2 x 10 - with Pillow cas   Therapeutic Activity (with intent to improve upward/outward reaching and carrying):  Hooklying OH reach   1 x 10 - AROM   1 x 10 - 3 Kg MB   1 x 10 - weighted dowel   Hooklying D2 Shoulder Flexion   R: 2 x 10 - AROM   R: 1 x 10 - 2# DB  Standing Shoulder Flexion   3 x 10 - 3# DB   Standing Shoulder Abduction   3 x 10 - 3# DB  PATIENT EDUCATION:  Education  details: Exercise Technique  Person educated: Patient Education method: Medical Illustrator Education comprehension: verbalized understanding and returned demonstration   HOME EXERCISE PROGRAM:  Access Code: [email or web address] URL: [email or web address] Date: 02/15/2024 Prepared by: Lonni Pall  Exercises - Seated Biceps Curl  - 2 x daily - 7 x weekly - 3 sets - 10-20 reps - Seated Shoulder External Rotation AAROM with Dowel  - 2 x daily - 7 x weekly - 2-3 sets - 10-12 reps - Seated Gripping Towel  - 2 x daily - 7 x weekly - 3 sets - 10-12 reps - Circular Shoulder Pendulum with Table Support  - 2 x daily - 7 x weekly - 3 sets - 20 reps - Seated Shoulder Flexion Towel Slide at Table Top  - 2 x daily - 7 x weekly - 3 sets - 10-12 reps - Seated Shoulder Abduction Towel Slide at Table Top  - 2 x daily - 7 x weekly - 3 sets - 10-12 reps - Shoulder Abduction with Dumbbells - Palms Down  - 1 x daily - 3-4 x weekly - 2-3 sets - 10 reps - Standing Shoulder Flexion to 90 Degrees with Dumbbells  - 1 x daily - 3-4 x weekly - 2-3 sets - 10 reps - Forearm Pronation and Supination with Hammer  - 1 x daily - 3-4 x weekly - 2-3 sets - 10-12 reps - Standing Shoulder Row with Anchored Resistance  - 1 x daily - 3-4 x weekly - 2-3 sets - 10-12 reps - Seated Single-Shoulder PNF D2 Flexion With Anchored Resistance  - 2 x daily - 7 x weekly - 2-3 sets - 10 reps - arom only no weight or resistance - Doorway Pec Stretch at 60 Elevation  - 2 x daily - 7 x weekly - 3 sets - 30s hold - Standing Shoulder Abduction AAROM with Dowel (Mirrored)  - 2 x daily - 7 x weekly - 3 sets - 10 reps - Standing Shoulder Flexion AAROM with Dowel  - 2 x daily - 7 x weekly - 3 sets - 10 reps - Scapular Wall Slides  - 1 x daily - 3-4 x weekly - 2-3 sets - 10 reps - Scaption Wall Slide with Towel  - 1 x daily - 7 x weekly - 2-3 sets - 10-12 reps  Access Code: [email or web address] URL: [email or web address] Date:  02/01/2024 Prepared by: Lonni Pall  Exercises - Seated Biceps Curl  - 2 x daily - 7  x weekly - 3 sets - 10-20 reps - Seated Scapular Retraction  - 2 x daily - 7 x weekly - 3 sets - 10-20 reps - Seated Shoulder External Rotation AAROM with Dowel  - 2 x daily - 7 x weekly - 2-3 sets - 10-12 reps - Seated Gripping Towel  - 2 x daily - 7 x weekly - 3 sets - 10-12 reps - Circular Shoulder Pendulum with Table Support  - 2 x daily - 7 x weekly - 3 sets - 20 reps - Seated Shoulder Flexion Towel Slide at Table Top  - 2 x daily - 7 x weekly - 3 sets - 10-12 reps - Seated Shoulder Abduction Towel Slide at Table Top  - 2 x daily - 7 x weekly - 3 sets - 10-12 reps - Shoulder Abduction with Dumbbells - Palms Down  - 1 x daily - 3-4 x weekly - 2-3 sets - 10 reps - Standing Shoulder Flexion to 90 Degrees with Dumbbells  - 1 x daily - 3-4 x weekly - 2-3 sets - 10 reps - Doorway Pec Stretch at 60 Elevation  - 2 x daily - 7 x weekly - 3 sets - 30s hold - Standing Shoulder Abduction AAROM with Dowel (Mirrored)  - 2 x daily - 7 x weekly - 3 sets - 10 reps - Standing Shoulder Flexion AAROM with Dowel  - 2 x daily - 7 x weekly - 3 sets - 10 reps  ASSESSMENT:  CLINICAL IMPRESSION: Jodie returns to OPPT for management of s/p ORIF on 11/15/2023. Patient is 11 weeks s/p surgery. Patient tolerated increase in repetitions with exercises targeting parascapular muscles. He continues to demonstrate imrpovements with UE strength but still has deficits in UE functional AROM. Patient unable to reach 120 deg of scapular in standing however able to reach [facility] in hooklying with assistance. PT to continue focusing on parascapular strength and rotator cuff strengthening as tolerated. Patient's limitations prevent full participation in recreational activities and has pain with basic iADLs (dressing, OH reaching tasks). Based on today's performance, pt will continue to benefit from skilled PT in order to facilitate return to PLOF and  improve QoL.  OBJECTIVE IMPAIRMENTS: decreased activity tolerance, decreased endurance, decreased ROM, decreased strength, increased edema, increased fascial restrictions, increased muscle spasms, impaired sensation, and pain.   ACTIVITY LIMITATIONS: carrying, lifting, transfers, bathing, dressing, reach over head, and hygiene/grooming  PARTICIPATION LIMITATIONS: meal prep, cleaning, laundry, community activity, and occupation  PERSONAL FACTORS: Age, Past/current experiences, Time since onset of injury/illness/exacerbation, and 3+ comorbidities: Hx of COPD, ACDF, recent humeral fixation are also affecting patient's functional outcome.   REHAB POTENTIAL: Good  CLINICAL DECISION MAKING: Evolving/moderate complexity  EVALUATION COMPLEXITY: Moderate   GOALS: Goals reviewed with patient? Yes  SHORT TERM GOALS: Target date: 02/07/2024  Pt will be independent with HEP to improve strength and decrease shoulder pain to improve pain-free function at home and work. Baseline: Initial HEP provided  Goal status: INITIAL   LONG TERM GOALS: Target date: 0303/2026  Pt will decrease quick DASH score by at least 8% in order to demonstrate clinically significant reduction in disability with UE ADLs.  Baseline: 12/27/2023: TBD; 01/17/2024: 84.1 / 100 = 84.1 %; 02/06/2024: 72.7 / 100 = 72.7 % Goal status: Progressing  2.  Pt will decrease worst shoulder pain by at least 3 points on the NPRS in order to demonstrate clinically significant reduction in shoulder pain. Baseline: 12/27/2023: 10/10 NPS; 02/06/2024: 8/10  Goal status: Progressing  3.  Pt will increase R shoulder flexion/abduction AROM to 140/180 respectively and pain free in order to demonstrate limb symmetry for OH lifting and ADLs.  Baseline: 12/27/2023:   Right Left R: 02/06/2024  Shoulder     Flexion NT/80 deg  140 120 AROM  Extension     Abduction NT/40 deg 160 100 AROM   Goal status: INITIAL  4. Pt will increase R shoulder  strength by at least 1/2 MMT grade in order to demonstrate improvement in strength and function         Baseline: 12/27/2023:  Shoulder  R MMT (Out of 5)  Flexion NT 4 4-  Extension     Abduction NT 4 4-  External rotation NT 4 4-  Internal rotation NT 4 5   Goal status: INITIAL  5.  Pt will increase R grip strength to at least 90% limb symmetry in order to demonstrate significant improvements in hand function for cutting, grasping items.  Baseline: 01/17/2024: 55#/110#; 02/06/2023: 45#/100#  Goal status: INITIAL  PLAN:  PT FREQUENCY: 1-2x/week  PT DURATION: 12 weeks  PLANNED INTERVENTIONS: [id number]- PT Re-evaluation, 97750- Physical Performance Testing, 97110-Therapeutic exercises, 97530- Therapeutic activity, 97112- Neuromuscular re-education, 97535- Self Care, [id number]- Manual therapy, Patient/Family education, Joint mobilization, Cryotherapy, and Moist heat  PLAN FOR NEXT SESSION: Review HEP, Progress AAROM > AROM for R shoulder, Education on sling wear schedule per MD Protocol, Elbow and Wrist Mobility   Lonni Pall PT, DPT Physical Therapist- [place]  02/15/2024, 10:21 PM  "

## 2024-02-20 ENCOUNTER — Ambulatory Visit

## 2024-02-29 ENCOUNTER — Ambulatory Visit (HOSPITAL_BASED_OUTPATIENT_CLINIC_OR_DEPARTMENT_OTHER): Admitting: Orthopaedic Surgery

## 2024-03-01 ENCOUNTER — Ambulatory Visit

## 2024-03-06 ENCOUNTER — Ambulatory Visit

## 2024-03-12 ENCOUNTER — Ambulatory Visit

## 2024-03-14 ENCOUNTER — Ambulatory Visit

## 2024-03-19 ENCOUNTER — Ambulatory Visit

## 2024-03-21 ENCOUNTER — Ambulatory Visit

## 2024-03-26 ENCOUNTER — Ambulatory Visit

## 2024-03-28 ENCOUNTER — Ambulatory Visit

## 2024-04-03 ENCOUNTER — Ambulatory Visit

## 2024-04-05 ENCOUNTER — Ambulatory Visit

## 2024-04-09 ENCOUNTER — Ambulatory Visit

## 2024-04-11 ENCOUNTER — Ambulatory Visit

## 2024-04-16 ENCOUNTER — Ambulatory Visit

## 2024-04-18 ENCOUNTER — Ambulatory Visit

## 2024-04-19 ENCOUNTER — Ambulatory Visit
